# Patient Record
Sex: Female | Born: 1970 | Hispanic: No | Marital: Single | Smoking: Current every day smoker
Health system: Southern US, Community
[De-identification: ages and names within clinical notes are randomized; demographics above are authoritative.]

## PROBLEM LIST (undated history)

## (undated) DIAGNOSIS — N760 Acute vaginitis: Secondary | ICD-10-CM

## (undated) DIAGNOSIS — D509 Iron deficiency anemia, unspecified: Principal | ICD-10-CM

## (undated) DIAGNOSIS — T4145XA Adverse effect of unspecified anesthetic, initial encounter: Secondary | ICD-10-CM

## (undated) DIAGNOSIS — I10 Essential (primary) hypertension: Secondary | ICD-10-CM

## (undated) DIAGNOSIS — J309 Allergic rhinitis, unspecified: Secondary | ICD-10-CM

## (undated) DIAGNOSIS — B9689 Other specified bacterial agents as the cause of diseases classified elsewhere: Secondary | ICD-10-CM

## (undated) DIAGNOSIS — D219 Benign neoplasm of connective and other soft tissue, unspecified: Secondary | ICD-10-CM

## (undated) DIAGNOSIS — T7840XA Allergy, unspecified, initial encounter: Secondary | ICD-10-CM

## (undated) DIAGNOSIS — K219 Gastro-esophageal reflux disease without esophagitis: Secondary | ICD-10-CM

## (undated) DIAGNOSIS — I499 Cardiac arrhythmia, unspecified: Secondary | ICD-10-CM

## (undated) DIAGNOSIS — N390 Urinary tract infection, site not specified: Secondary | ICD-10-CM

## (undated) HISTORY — DX: Benign neoplasm of connective and other soft tissue, unspecified: D21.9

## (undated) HISTORY — DX: Iron deficiency anemia, unspecified: D50.9

## (undated) HISTORY — DX: Other specified bacterial agents as the cause of diseases classified elsewhere: B96.89

## (undated) HISTORY — DX: Allergic rhinitis, unspecified: J30.9

## (undated) HISTORY — DX: Essential (primary) hypertension: I10

## (undated) HISTORY — DX: Other specified bacterial agents as the cause of diseases classified elsewhere: N76.0

## (undated) HISTORY — DX: Allergy, unspecified, initial encounter: T78.40XA

## (undated) HISTORY — PX: ABDOMINAL HYSTERECTOMY: SHX81

## (undated) HISTORY — DX: Urinary tract infection, site not specified: N39.0

## (undated) HISTORY — PX: DILATION AND CURETTAGE OF UTERUS: SHX78

---

## 2001-06-24 ENCOUNTER — Emergency Department (HOSPITAL_COMMUNITY): Admission: EM | Admit: 2001-06-24 | Discharge: 2001-06-24 | Payer: Self-pay

## 2006-09-17 DIAGNOSIS — T8859XA Other complications of anesthesia, initial encounter: Secondary | ICD-10-CM

## 2006-09-17 HISTORY — DX: Other complications of anesthesia, initial encounter: T88.59XA

## 2008-01-14 ENCOUNTER — Emergency Department: Payer: Self-pay | Admitting: Emergency Medicine

## 2008-04-06 ENCOUNTER — Ambulatory Visit: Payer: Self-pay | Admitting: Obstetrics and Gynecology

## 2009-12-15 ENCOUNTER — Emergency Department: Payer: Self-pay | Admitting: Emergency Medicine

## 2010-11-22 ENCOUNTER — Emergency Department: Payer: Self-pay | Admitting: Emergency Medicine

## 2012-05-07 ENCOUNTER — Emergency Department (HOSPITAL_COMMUNITY)
Admission: EM | Admit: 2012-05-07 | Discharge: 2012-05-07 | Disposition: A | Discharge status: Home / Self Care | Payer: Self-pay | Attending: Emergency Medicine | Admitting: Emergency Medicine

## 2012-05-07 ENCOUNTER — Encounter (HOSPITAL_COMMUNITY): Payer: Self-pay | Admitting: *Deleted

## 2012-05-07 DIAGNOSIS — F17200 Nicotine dependence, unspecified, uncomplicated: Secondary | ICD-10-CM | POA: Insufficient documentation

## 2012-05-07 DIAGNOSIS — K5289 Other specified noninfective gastroenteritis and colitis: Secondary | ICD-10-CM | POA: Insufficient documentation

## 2012-05-07 DIAGNOSIS — K529 Noninfective gastroenteritis and colitis, unspecified: Secondary | ICD-10-CM

## 2012-05-07 LAB — CBC WITH DIFFERENTIAL/PLATELET
Basophils Relative: 0 % (ref 0–1)
Eosinophils Absolute: 0.1 10*3/uL (ref 0.0–0.7)
Eosinophils Relative: 1 % (ref 0–5)
Hemoglobin: 9.9 g/dL — ABNORMAL LOW (ref 12.0–15.0)
Lymphs Abs: 3.9 10*3/uL (ref 0.7–4.0)
MCH: 22.3 pg — ABNORMAL LOW (ref 26.0–34.0)
MCHC: 30.8 g/dL (ref 30.0–36.0)
MCV: 72.3 fL — ABNORMAL LOW (ref 78.0–100.0)
Monocytes Absolute: 0.4 10*3/uL (ref 0.1–1.0)
Neutro Abs: 6.2 10*3/uL (ref 1.7–7.7)
Neutrophils Relative %: 58 % (ref 43–77)
RBC: 4.44 MIL/uL (ref 3.87–5.11)

## 2012-05-07 LAB — BASIC METABOLIC PANEL
BUN: 9 mg/dL (ref 6–23)
CO2: 23 mEq/L (ref 19–32)
GFR calc non Af Amer: >90 mL/min (ref >90)
Glucose, Bld: 94 mg/dL (ref 70–99)
Potassium: 3.7 mEq/L (ref 3.5–5.1)
Sodium: 139 mEq/L (ref 135–145)

## 2012-05-07 LAB — URINALYSIS, ROUTINE W REFLEX MICROSCOPIC
Bilirubin Urine: NEGATIVE
Glucose, UA: NEGATIVE mg/dL
Ketones, ur: NEGATIVE mg/dL
Protein, ur: NEGATIVE mg/dL
pH: 6 (ref 5.0–8.0)

## 2012-05-07 LAB — URINE MICROSCOPIC-ADD ON

## 2012-05-07 MED ORDER — SODIUM CHLORIDE 0.9 % IV BOLUS (SEPSIS)
1000.0000 mL | Freq: Once | INTRAVENOUS | Status: AC
  Administered 2012-05-07: 1000 mL via INTRAVENOUS

## 2012-05-07 MED ORDER — LOPERAMIDE HCL 2 MG PO CAPS: 2.0000 mg | ORAL_CAPSULE | Freq: Four times a day (QID) | ORAL | Status: AC | PRN

## 2012-05-07 MED ORDER — PROMETHAZINE HCL 25 MG PO TABS: 25.0000 mg | ORAL_TABLET | Freq: Four times a day (QID) | ORAL | Status: DC | PRN

## 2012-05-07 NOTE — ED Provider Notes (Signed)
History     CSN: [id number]  Arrival date & time 05/07/12  1310   First MD Initiated Contact with Patient 05/07/12 1958      Chief Complaint  Patient presents with  . Abdominal Pain  . Emesis  . Vaginal Discharge    (Consider location/radiation/quality/duration/timing/severity/associated sxs/prior treatment) Patient is a 41 y.o. female presenting with abdominal pain. The history is provided by the patient.  Abdominal Pain The primary symptoms of the illness include abdominal pain, nausea, vomiting and diarrhea. The primary symptoms of the illness do not include fever, shortness of breath, dysuria or vaginal discharge.  Symptoms associated with the illness do not include chills, diaphoresis, hematuria or back pain.   she complains of lower abdominal pain, with nausea, vomiting, diarrhea, for the past week.  She says the pain is crampy in nature, and lasts a few minutes at a time.  She has not had blood in her emesis or stool.  She denies urinary tract symptoms.  She specifically denies any vaginal discharge.  She has not had fevers, or chills.  She states that her boyfriend has had similar symptoms.  Presently, she has no abdominal pain, and no nausea.  History reviewed. No pertinent past medical history.  History reviewed. No pertinent past surgical history.  No family history on file.  History  Substance Use Topics  . Smoking status: Current Everyday Smoker  . Smokeless tobacco: Not on file  . Alcohol Use: Yes     rare    OB History    Grav Para Term Preterm Abortions TAB SAB Ect Mult Living                  Review of Systems  Constitutional: Negative for fever, chills and diaphoresis.  Respiratory: Negative for cough and shortness of breath.   Cardiovascular: Negative for chest pain.  Gastrointestinal: Positive for nausea, vomiting, abdominal pain and diarrhea.  Genitourinary: Negative for dysuria, hematuria and vaginal discharge.  Musculoskeletal: Negative for back  pain.  Neurological: Negative for headaches.  Psychiatric/Behavioral: Negative for confusion.  All other systems reviewed and are negative.    Allergies  Review of patient's allergies indicates no known allergies.  Home Medications  No current outpatient prescriptions on file.  BP 136/80  Pulse 80  Temp 98.6 F (37 C) (Oral)  Resp 18  SpO2 100%  LMP 03/30/2012  Physical Exam  Nursing note and vitals reviewed. Constitutional: She is oriented to person, place, and time. She appears well-developed and well-nourished. No distress.  HENT:  Head: Normocephalic and atraumatic.  Eyes: Conjunctivae are normal.  Neck: Normal range of motion. Neck supple.  Cardiovascular: Normal rate, regular rhythm and intact distal pulses.   No murmur heard. Pulmonary/Chest: Effort normal and breath sounds normal.  Abdominal: Soft. Bowel sounds are normal. She exhibits no distension. There is no tenderness. There is no rebound and no guarding.  Musculoskeletal: Normal range of motion.  Neurological: She is alert and oriented to person, place, and time.  Skin: Skin is warm.  Psychiatric: She has a normal mood and affect. Thought content normal.    ED Course  Procedures (including critical care time) 41 year old, female, with symptoms consistent with gastroenteritis for approximately one week.  Currently, asymptomatic.  No acute abdomen.  We'll perform laboratory testing, and treat with IV fluids, since her symptoms have been for such a prolonged period.  Labs Reviewed  CBC WITH DIFFERENTIAL - Abnormal; Notable for the following:    WBC 10.6 (*)  Hemoglobin 9.9 (*)     HCT 32.1 (*)     MCV 72.3 (*)     MCH 22.3 (*)     RDW 19.1 (*)     All other components within normal limits  URINALYSIS, ROUTINE W REFLEX MICROSCOPIC - Abnormal; Notable for the following:    APPearance CLOUDY (*)     Hgb urine dipstick SMALL (*)     Leukocytes, UA MODERATE (*)     All other components within normal  limits  URINE MICROSCOPIC-ADD ON - Abnormal; Notable for the following:    Squamous Epithelial / LPF FEW (*)     All other components within normal limits  BASIC METABOLIC PANEL  POCT PREGNANCY, URINE   No results found.   No diagnosis found.    MDM  Gastroenteritis.  No signs of toxicity.  No acute abdomen.  No evidence of dehydration.        Cheri Guppy, MD 05/07/12 2138

## 2012-05-07 NOTE — ED Notes (Signed)
PT is here with abdominal pain, nausea, and vomiting with diarrhea.  Pt reports vaginal discharge that is white with odor and pain.  LMP-7/14

## 2012-05-07 NOTE — ED Notes (Signed)
Pt c/o lower abdominal pain, pt c/o nausea and states she vomited twice this morning. Pt rates pain at a 5, pt states she has rectal pain, pt denies blooding stool and states that she has had the rectal pain for about a week.

## 2013-07-10 ENCOUNTER — Other Ambulatory Visit: Payer: Self-pay | Admitting: *Deleted

## 2014-03-29 ENCOUNTER — Ambulatory Visit: Payer: Self-pay | Admitting: Internal Medicine

## 2014-04-17 ENCOUNTER — Ambulatory Visit: Payer: Self-pay | Admitting: Internal Medicine

## 2014-06-21 ENCOUNTER — Ambulatory Visit: Payer: Self-pay | Admitting: Internal Medicine

## 2014-06-21 LAB — CBC CANCER CENTER
BASOS ABS: 0.1 x10 3/mm (ref 0.0–0.1)
Basophil %: 0.6 %
EOS PCT: 1.6 %
Eosinophil #: 0.2 x10 3/mm (ref 0.0–0.7)
HCT: 36.3 % (ref 35.0–47.0)
HGB: 11.7 g/dL — ABNORMAL LOW (ref 12.0–16.0)
LYMPHS ABS: 4 x10 3/mm — AB (ref 1.0–3.6)
LYMPHS PCT: 38.6 %
MCH: 27.1 pg (ref 26.0–34.0)
MCHC: 32.1 g/dL (ref 32.0–36.0)
MCV: 84 fL (ref 80–100)
MONO ABS: 0.5 x10 3/mm (ref 0.2–0.9)
MONOS PCT: 5.3 %
Neutrophil #: 5.6 x10 3/mm (ref 1.4–6.5)
Neutrophil %: 53.9 %
Platelet: 434 x10 3/mm (ref 150–440)
RBC: 4.31 10*6/uL (ref 3.80–5.20)
RDW: 22.3 % — AB (ref 11.5–14.5)
WBC: 10.3 x10 3/mm (ref 3.6–11.0)

## 2014-06-21 LAB — FERRITIN: Ferritin (ARMC): 7 ng/mL — ABNORMAL LOW (ref 8–388)

## 2014-06-21 LAB — IRON AND TIBC
IRON BIND. CAP.(TOTAL): 423 ug/dL (ref 250–450)
IRON SATURATION: 5 %
IRON: 23 ug/dL — AB (ref 50–170)
Unbound Iron-Bind.Cap.: 400 ug/dL

## 2014-06-21 LAB — RETICULOCYTES
Absolute Retic Count: 0.0558 10*6/uL (ref 0.019–0.186)
RETICULOCYTE: 1.29 % (ref 0.4–3.1)

## 2014-06-21 LAB — FOLATE: Folic Acid: 7.6 ng/mL (ref 3.1–100.0)

## 2014-07-18 ENCOUNTER — Ambulatory Visit: Payer: Self-pay | Admitting: Internal Medicine

## 2014-09-13 ENCOUNTER — Ambulatory Visit: Payer: Self-pay | Admitting: Internal Medicine

## 2014-09-13 LAB — CBC CANCER CENTER
BASOS ABS: 0.1 x10 3/mm (ref 0.0–0.1)
Basophil %: 0.5 %
Eosinophil #: 0.2 x10 3/mm (ref 0.0–0.7)
Eosinophil %: 2 %
HCT: 40.2 % (ref 35.0–47.0)
HGB: 13.2 g/dL (ref 12.0–16.0)
LYMPHS ABS: 3.6 x10 3/mm (ref 1.0–3.6)
Lymphocyte %: 35.5 %
MCH: 30.8 pg (ref 26.0–34.0)
MCHC: 32.9 g/dL (ref 32.0–36.0)
MCV: 94 fL (ref 80–100)
MONOS PCT: 5.5 %
Monocyte #: 0.6 x10 3/mm (ref 0.2–0.9)
NEUTROS PCT: 56.5 %
Neutrophil #: 5.8 x10 3/mm (ref 1.4–6.5)
PLATELETS: 297 x10 3/mm (ref 150–440)
RBC: 4.29 10*6/uL (ref 3.80–5.20)
RDW: 15.4 % — ABNORMAL HIGH (ref 11.5–14.5)
WBC: 10.2 x10 3/mm (ref 3.6–11.0)

## 2014-09-13 LAB — FERRITIN: Ferritin (ARMC): 24 ng/mL (ref 8–388)

## 2014-09-13 LAB — IRON AND TIBC
IRON BIND. CAP.(TOTAL): 339 ug/dL (ref 250–450)
IRON SATURATION: 28 %
Iron: 95 ug/dL (ref 50–170)
UNBOUND IRON-BIND. CAP.: 244 ug/dL

## 2014-09-17 ENCOUNTER — Ambulatory Visit: Payer: Self-pay | Admitting: Internal Medicine

## 2015-01-03 ENCOUNTER — Ambulatory Visit
Admit: 2015-01-03 | Disposition: A | Discharge status: Home / Self Care | Payer: Self-pay | Attending: Internal Medicine | Admitting: Internal Medicine

## 2015-01-03 LAB — CBC CANCER CENTER
BASOS ABS: 0.1 x10 3/mm (ref 0.0–0.1)
Basophil %: 0.8 %
Eosinophil #: 0.2 x10 3/mm (ref 0.0–0.7)
Eosinophil %: 1.5 %
HCT: 32.8 % — ABNORMAL LOW (ref 35.0–47.0)
HGB: 10.5 g/dL — ABNORMAL LOW (ref 12.0–16.0)
LYMPHS ABS: 4.3 x10 3/mm — AB (ref 1.0–3.6)
Lymphocyte %: 43 %
MCH: 27.3 pg (ref 26.0–34.0)
MCHC: 31.9 g/dL — ABNORMAL LOW (ref 32.0–36.0)
MCV: 86 fL (ref 80–100)
MONO ABS: 0.7 x10 3/mm (ref 0.2–0.9)
Monocyte %: 6.9 %
NEUTROS ABS: 4.8 x10 3/mm (ref 1.4–6.5)
Neutrophil %: 47.8 %
PLATELETS: 393 x10 3/mm (ref 150–440)
RBC: 3.84 10*6/uL (ref 3.80–5.20)
RDW: 16.8 % — AB (ref 11.5–14.5)
WBC: 10 x10 3/mm (ref 3.6–11.0)

## 2015-01-03 LAB — IRON AND TIBC
Iron Bind.Cap.(Total): 475 — ABNORMAL HIGH (ref 250–450)
Iron Saturation: 3.8
Iron: 18 ug/dL — ABNORMAL LOW
Unbound Iron-Bind.Cap.: 457.2

## 2015-01-03 LAB — FERRITIN: Ferritin (ARMC): 4 ng/mL — ABNORMAL LOW

## 2015-01-12 LAB — PREGNANCY, URINE: PREGNANCY TEST, URINE: NEGATIVE m[IU]/mL

## 2015-01-25 ENCOUNTER — Encounter: Payer: Self-pay | Admitting: *Deleted

## 2015-01-25 ENCOUNTER — Other Ambulatory Visit: Payer: Self-pay | Admitting: *Deleted

## 2015-01-25 DIAGNOSIS — Z862 Personal history of diseases of the blood and blood-forming organs and certain disorders involving the immune mechanism: Secondary | ICD-10-CM | POA: Insufficient documentation

## 2015-01-25 DIAGNOSIS — D509 Iron deficiency anemia, unspecified: Secondary | ICD-10-CM

## 2015-01-25 HISTORY — DX: Iron deficiency anemia, unspecified: D50.9

## 2015-01-26 ENCOUNTER — Inpatient Hospital Stay: Payer: Medicaid Other | Attending: Internal Medicine

## 2015-01-26 ENCOUNTER — Encounter (INDEPENDENT_AMBULATORY_CARE_PROVIDER_SITE_OTHER): Payer: Self-pay

## 2015-01-26 VITALS — BP 124/76 | HR 73 | Temp 97.2°F | Resp 18

## 2015-01-26 DIAGNOSIS — Z79899 Other long term (current) drug therapy: Secondary | ICD-10-CM | POA: Insufficient documentation

## 2015-01-26 DIAGNOSIS — D509 Iron deficiency anemia, unspecified: Secondary | ICD-10-CM | POA: Insufficient documentation

## 2015-01-26 LAB — PREGNANCY, URINE: Preg Test, Ur: NEGATIVE

## 2015-01-26 MED ORDER — SODIUM CHLORIDE 0.9 % IV SOLN
Freq: Once | INTRAVENOUS | Status: AC
  Administered 2015-01-26: 11:00:00 via INTRAVENOUS
  Filled 2015-01-26: qty 1000

## 2015-01-26 MED ORDER — SODIUM CHLORIDE 0.9 % IV SOLN
400.0000 mg | Freq: Once | INTRAVENOUS | Status: AC
  Administered 2015-01-26: 400 mg via INTRAVENOUS
  Filled 2015-01-26: qty 20

## 2015-04-24 ENCOUNTER — Other Ambulatory Visit: Payer: Self-pay | Admitting: *Deleted

## 2015-04-24 DIAGNOSIS — D509 Iron deficiency anemia, unspecified: Secondary | ICD-10-CM

## 2015-04-25 ENCOUNTER — Inpatient Hospital Stay: Payer: Medicaid Other

## 2015-05-10 ENCOUNTER — Ambulatory Visit: Payer: Self-pay | Admitting: Obstetrics and Gynecology

## 2015-05-12 ENCOUNTER — Encounter: Payer: Medicaid Other | Admitting: Family Medicine

## 2015-06-07 ENCOUNTER — Encounter: Payer: Medicaid Other | Admitting: Family Medicine

## 2015-07-15 ENCOUNTER — Encounter: Payer: Medicaid Other | Admitting: Family Medicine

## 2015-07-28 ENCOUNTER — Encounter: Payer: Medicaid Other | Admitting: Family Medicine

## 2015-08-15 ENCOUNTER — Ambulatory Visit: Payer: Self-pay | Admitting: Internal Medicine

## 2015-08-15 ENCOUNTER — Encounter: Payer: Self-pay | Admitting: Family Medicine

## 2015-08-15 ENCOUNTER — Ambulatory Visit (INDEPENDENT_AMBULATORY_CARE_PROVIDER_SITE_OTHER): Payer: Medicaid Other | Admitting: Family Medicine

## 2015-08-15 ENCOUNTER — Inpatient Hospital Stay: Payer: Medicaid Other

## 2015-08-15 ENCOUNTER — Other Ambulatory Visit: Payer: Self-pay | Admitting: *Deleted

## 2015-08-15 ENCOUNTER — Inpatient Hospital Stay: Payer: Medicaid Other | Attending: Internal Medicine

## 2015-08-15 VITALS — BP 128/78 | HR 79 | Temp 98.6°F | Resp 18 | Ht 63.0 in | Wt 187.4 lb

## 2015-08-15 DIAGNOSIS — D509 Iron deficiency anemia, unspecified: Secondary | ICD-10-CM

## 2015-08-15 DIAGNOSIS — D500 Iron deficiency anemia secondary to blood loss (chronic): Secondary | ICD-10-CM | POA: Diagnosis not present

## 2015-08-15 DIAGNOSIS — Z0000 Encounter for general adult medical examination without abnormal findings: Secondary | ICD-10-CM

## 2015-08-15 DIAGNOSIS — T7840XA Allergy, unspecified, initial encounter: Secondary | ICD-10-CM | POA: Diagnosis not present

## 2015-08-15 DIAGNOSIS — N3000 Acute cystitis without hematuria: Secondary | ICD-10-CM

## 2015-08-15 DIAGNOSIS — D259 Leiomyoma of uterus, unspecified: Secondary | ICD-10-CM

## 2015-08-15 LAB — POCT URINALYSIS DIPSTICK
BILIRUBIN UA: NEGATIVE
GLUCOSE UA: NEGATIVE
Protein, UA: NEGATIVE
SPEC GRAV UA: 1.02
Urobilinogen, UA: NEGATIVE
pH, UA: 6

## 2015-08-15 MED ORDER — LORATADINE 10 MG PO TABS: 10.0000 mg | ORAL_TABLET | Freq: Every day | ORAL | Status: DC

## 2015-08-15 MED ORDER — CIPROFLOXACIN HCL 500 MG PO TABS: 500.0000 mg | ORAL_TABLET | Freq: Two times a day (BID) | ORAL | Status: DC

## 2015-08-15 NOTE — Progress Notes (Signed)
Name: Jill Davidson   MRN: [id number]    DOB: May 02, 1971   Date:08/15/2015       Progress Note  Subjective  Chief Complaint  Chief Complaint  Patient presents with  . Annual Exam    possible UTI    HPI  Compress to the H&P for this  44 year old female  UTI  Patient presents with a several day history of dysuria frequency and urgency.  There has been suprapubic and lower back discomfort.  No fever or chills noted.  There is been no hematuria. There has not been a past history of recurrent UTIs.   Past Medical History  Diagnosis Date  . IDA (iron deficiency anemia) 01/25/2015    Social History  Substance Use Topics  . Smoking status: Current Every Day Smoker  . Smokeless tobacco: Not on file  . Alcohol Use: Yes     Comment: rare    No current outpatient prescriptions on file.  No Known Allergies  Review of Systems  Constitutional: Negative for fever, chills and weight loss.  HENT: Negative for congestion, hearing loss, sore throat and tinnitus.   Eyes: Negative for blurred vision, double vision and redness.  Respiratory: Negative for cough, hemoptysis and shortness of breath.   Cardiovascular: Negative for chest pain, palpitations, orthopnea, claudication and leg swelling.  Gastrointestinal: Negative for heartburn, nausea, vomiting, diarrhea, constipation and blood in stool.  Genitourinary: Positive for dysuria, urgency and frequency. Negative for hematuria.  Musculoskeletal: Negative for myalgias, back pain, joint pain, falls and neck pain.  Skin: Negative for itching.  Neurological: Negative for dizziness, tingling, tremors, focal weakness, seizures, loss of consciousness, weakness and headaches.  Endo/Heme/Allergies: Does not bruise/bleed easily.  Psychiatric/Behavioral: Negative for depression and substance abuse. The patient is not nervous/anxious and does not have insomnia.      Objective  Filed Vitals:   08/15/15 0935  BP: 128/78  Pulse: 79  Temp: 98.6  F (37 C)  Resp: 18  Height: 5\' 3"  (1.6 m)  Weight: 187 lb 7 oz (85.021 kg)  SpO2: 99%     Physical Exam  Constitutional: She is oriented to person, place, and time.  Modestly obese in no acute distress  HENT:  Head: Normocephalic.  Eyes: EOM are normal. Pupils are equal, round, and reactive to light.  Neck: Normal range of motion. No thyromegaly present.  Cardiovascular: Normal rate, regular rhythm and normal heart sounds.   No murmur heard. Pulmonary/Chest: Effort normal and breath sounds normal.  Abdominal: Soft. Bowel sounds are normal.  Musculoskeletal: Normal range of motion. She exhibits no edema.  Neurological: She is alert and oriented to person, place, and time. No cranial nerve deficit. Gait normal.  Skin: Skin is warm and dry. No rash noted.  Psychiatric: Memory and affect normal.      Assessment & Plan  1. Annual physical exam  - CBC - Comprehensive Metabolic Panel (CMET) - Lipid Profile - TSH  2. Acute cystitis without hematuria  - POCT Urinalysis Dipstick - Urine Culture - ciprofloxacin (CIPRO) 500 MG tablet; Take 1 tablet (500 mg total) by mouth 2 (two) times daily.  Dispense: 14 tablet; Refill: 0  3. Allergy, initial encounter  - loratadine (CLARITIN) 10 MG tablet; Take 1 tablet (10 mg total) by mouth daily.  Dispense: 90 tablet; Refill: 3  4. Iron deficiency anemia due to chronic blood loss Workup for iron deficiency anemia - Ferritin - Iron Binding Cap (TIBC) - Iron  5. Uterine leiomyoma, unspecified location Then the  consideration for elective hysterotomy for hysterectomy and view of the degree of iron deficiency anemia she is experienced therefore will refer - Ambulatory referral to Gynecology

## 2015-08-15 NOTE — Patient Instructions (Signed)
Obesity Obesity is defined as having too much total body fat and a body mass index (BMI) of 30 or more. BMI is an estimate of body fat and is calculated from your height and weight. BMI is typically calculated by your health care provider during regular wellness visits. Obesity happens when you consume more calories than you can burn by exercising or performing daily physical tasks. Prolonged obesity can cause major illnesses or emergencies, such as:  Stroke.  Heart disease.  Diabetes.  Cancer.  Arthritis.  High blood pressure (hypertension).  High cholesterol.  Sleep apnea.  Erectile dysfunction.  Infertility problems. CAUSES   Regularly eating unhealthy foods.  Physical inactivity.  Certain disorders, such as an underactive thyroid (hypothyroidism), Cushing's syndrome, and polycystic ovarian syndrome.  Certain medicines, such as steroids, some depression medicines, and antipsychotics.  Genetics.  Lack of sleep. DIAGNOSIS A health care provider can diagnose obesity after calculating your BMI. Obesity will be diagnosed if your BMI is 30 or higher. There are other methods of measuring obesity levels. Some other methods include measuring your skinfold thickness, your waist circumference, and comparing your hip circumference to your waist circumference. TREATMENT  A healthy treatment program includes some or all of the following:  Long-term dietary changes.  Exercise and physical activity.  Behavioral and lifestyle changes.  Medicine only under the supervision of your health care provider. Medicines may help, but only if they are used with diet and exercise programs. If your BMI is 40 or higher, your health care provider may recommend specialized surgery or programs to help with weight loss. An unhealthy treatment program includes:  Fasting.  Fad diets.  Supplements and drugs. These choices do not succeed in long-term weight control. HOME CARE  INSTRUCTIONS  Exercise and perform physical activity as directed by your health care provider. To increase physical activity, try the following:  Use stairs instead of elevators.  Park farther away from store entrances.  Garden, bike, or walk instead of watching television or using the computer.  Eat healthy, low-calorie foods and drinks on a regular basis. Eat more fruits and vegetables. Use low-calorie cookbooks or take healthy cooking classes.  Limit fast food, sweets, and processed snack foods.  Eat smaller portions.  Keep a daily journal of everything you eat. There are many free websites to help you with this. It may be helpful to measure your foods so you can determine if you are eating the correct portion sizes.  Avoid drinking alcohol. Drink more water and drinks without calories.  Take vitamins and supplements only as recommended by your health care provider.  Weight-loss support groups, Tax adviser, counselors, and stress reduction education can also be very helpful. SEEK IMMEDIATE MEDICAL CARE IF:  You have chest pain or tightness.  You have trouble breathing or feel short of breath.  You have weakness or leg numbness.  You feel confused or have trouble talking.  You have sudden changes in your vision.   This information is not intended to replace advice given to you by your health care provider. Make sure you discuss any questions you have with your health care provider.   Document Released: 10/11/2004 Document Revised: 09/24/2014 Document Reviewed: 10/10/2011 Elsevier Interactive Patient Education 2016 Else[employer] Tobacco Use Disorder Tobacco use disorder (TUD) is a mental disorder. It is the long-term use of tobacco in spite of related health problems or difficulty with normal life activities. Tobacco is most commonly smoked as cigarettes and less commonly as cigars or pipes. Smokeless  chewing tobacco and snuff are also popular. People with TUD get  a feeling of extreme pleasure (euphoria) from using tobacco and have a desire to use it again and again. Repeated use of tobacco can cause problems. The addictive effects of tobacco are due mainly tothe ingredient nicotine. Nicotine also causes a rush of adrenaline (epinephrine) in the body. This leads to increased blood pressure, heart rate, and breathing rate. These changes may cause problems for people with high blood pressure, weak hearts, or lung disease. High doses of nicotine in children and pets can lead to seizures and death.  Tobacco contains a number of other unsafe chemicals. These chemicals are especially harmful when inhaled as smoke and can damage almost every organ in the body. Smokers live shorter lives than nonsmokers and are at risk of dying from a number of diseases and cancers. Tobacco smoke can also cause health problems for nonsmokers (due to inhaling secondhand smoke). Smoking is also a fire hazard.  TUD usually starts in the late teenage years and is most common in young adults between the ages of 42 and 70 years. People who start smoking earlier in life are more likely to continue smoking as adults. TUD is somewhat more common in men than women. People with TUD are at higher risk for using alcohol and other drugs of abuse. RISK FACTORS Risk factors for TUD include:   Having family members with the disorder.  Being around people who use tobacco.  Having an existing mental health issue such as schizophrenia, depression, bipolar disorder, ADHD, or posttraumatic stress disorder (PTSD). SIGNS AND SYMPTOMS  People with tobacco use disorder have two or more of the following signs and symptoms within 12 months:   Use of more tobacco over a longer period than intended.   Not able to cut down or control tobacco use.   A lot of time spent obtaining or using tobacco.   Strong desire or urge to use tobacco (craving). Cravings may last for 6 months or longer after  quitting.  Use of tobacco even when use leads to major problems at work, school, or home.   Use of tobacco even when use leads to relationship problems.   Giving up or cutting down on important life activities because of tobacco use.   Repeatedly using tobacco in situations where it puts you or others in physical danger, like smoking in bed.   Use of tobacco even when it is known that a physical or mental problem is likely related to tobacco use.   Physical problems are numerous and may include chronic bronchitis, emphysema, lung and other cancers, gum disease, high blood pressure, heart disease, and stroke.   Mental problems caused by tobacco may include difficulty sleeping and anxiety.  Need to use greater amounts of tobacco to get the same effect. This means you have developed a tolerance.   Withdrawal symptoms as a result of stopping or rapidly cutting back use. These symptoms may last a month or more after quitting and include the following:   Depressed, anxious, or irritable mood.   Difficulty concentrating.   Increased appetite.  Restlessness or trouble sleeping.   Use of tobacco to avoid withdrawal symptoms. DIAGNOSIS  Tobacco use disorder is diagnosed by your health care provider. A diagnosis may be made by:  Your health care provider asking questions about your tobacco use and any problems it may be causing.  A physical exam.  Lab tests.  You may be referred to a mental health  professional or addiction specialist. The severity of tobacco use disorder depends on the number of signs and symptoms you have:   Mild--Two or three symptoms.  Moderate--Four or five symptoms.   Severe--Six or more symptoms.  TREATMENT  Many people with tobacco use disorder are unable to quit on their own and need help. Treatment options include the following:  Nicotine replacement therapy (NRT). NRT provides nicotine without the other harmful chemicals in tobacco. NRT  gradually lowers the dosage of nicotine in the body and reduces withdrawal symptoms. NRT is available in over-the-counter forms (gum, lozenges, and skin patches) as well as prescription forms (mouth inhaler and nasal spray).  Medicines.This may include:  Antidepressant medicine that may reduce nicotine cravings.  A medicine that acts on nicotine receptors in the brain to reduce cravings and withdrawal symptoms. It may also block the effects of tobacco in people with TUD who relapse.  Counseling or talk therapy. A form of talk therapy called behavioral therapy is commonly used to treat people with TUD. Behavioral therapy looks at triggers for tobacco use, how to avoid them, and how to cope with cravings. It is most effective in person or by phone but is also available in self-help forms (books and Internet websites).  Support groups. These provide emotional support, advice, and guidance for quitting tobacco. The most effective treatment for TUD is usually a combination of medicine, talk therapy, and support groups. HOME CARE INSTRUCTIONS  Keep all follow-up visits as directed by your health care provider. This is important.  Take medicines only as directed by your health care provider.  Check with your health care provider before starting new prescription or over-the-counter medicines. SEEK MEDICAL CARE IF:  You are not able to take your medicines as prescribed.  Treatment is not helping your TUD and your symptoms get worse. SEEK IMMEDIATE MEDICAL CARE IF:  You have serious thoughts about hurting yourself or others.  You have trouble breathing, chest pain, sudden weakness, or sudden numbness in part of your body.   This information is not intended to replace advice given to you by your health care provider. Make sure you discuss any questions you have with your health care provider.   Document Released: 05/09/2004 Document Revised: 09/24/2014 Document Reviewed: 10/30/2013 Elsevier  Interactive Patient Education [employer].

## 2015-08-16 ENCOUNTER — Encounter: Payer: Medicaid Other | Admitting: Family Medicine

## 2015-08-16 LAB — IRON AND TIBC
IRON SATURATION: 3 % — AB (ref 15–55)
Iron: 13 ug/dL — ABNORMAL LOW (ref 27–159)
TIBC: 410 ug/dL (ref 250–450)
UIBC: 397 ug/dL (ref 131–425)

## 2015-08-16 LAB — COMPREHENSIVE METABOLIC PANEL
ALT: 6 IU/L (ref 0–32)
AST: 12 IU/L (ref 0–40)
Albumin/Globulin Ratio: 1.7 (ref 1.1–2.5)
Albumin: 3.8 g/dL (ref 3.5–5.5)
Alkaline Phosphatase: 45 IU/L (ref 39–117)
BUN/Creatinine Ratio: 14 (ref 9–23)
BUN: 10 mg/dL (ref 6–24)
Bilirubin Total: <0.2 mg/dL (ref 0.0–1.2)
CO2: 23 mmol/L (ref 18–29)
Calcium: 9.1 mg/dL (ref 8.7–10.2)
Chloride: 103 mmol/L (ref 97–106)
Creatinine, Ser: 0.69 mg/dL (ref 0.57–1.00)
GFR calc Af Amer: 124 mL/min/{1.73_m2} (ref >59)
GFR calc non Af Amer: 108 mL/min/{1.73_m2} (ref >59)
Globulin, Total: 2.3 g/dL (ref 1.5–4.5)
Glucose: 83 mg/dL (ref 65–99)
Potassium: 4.7 mmol/L (ref 3.5–5.2)
Sodium: 139 mmol/L (ref 136–144)
Total Protein: 6.1 g/dL (ref 6.0–8.5)

## 2015-08-16 LAB — CBC
Hematocrit: 31.5 % — ABNORMAL LOW (ref 34.0–46.6)
Hemoglobin: 9.7 g/dL — ABNORMAL LOW (ref 11.1–15.9)
MCH: 25.1 pg — ABNORMAL LOW (ref 26.6–33.0)
MCHC: 30.8 g/dL — ABNORMAL LOW (ref 31.5–35.7)
MCV: 82 fL (ref 79–97)
Platelets: 454 10*3/uL — ABNORMAL HIGH (ref 150–379)
RBC: 3.86 x10E6/uL (ref 3.77–5.28)
RDW: 17.1 % — ABNORMAL HIGH (ref 12.3–15.4)
WBC: 9.2 10*3/uL (ref 3.4–10.8)

## 2015-08-16 LAB — FERRITIN: Ferritin: 8 ng/mL — ABNORMAL LOW (ref 15–150)

## 2015-08-16 LAB — TSH: TSH: 1.32 u[IU]/mL (ref 0.450–4.500)

## 2015-08-16 LAB — LIPID PANEL
Chol/HDL Ratio: 3.1 ratio units (ref 0.0–4.4)
Cholesterol, Total: 138 mg/dL (ref 100–199)
HDL: 45 mg/dL (ref >39)
LDL Calculated: 80 mg/dL (ref 0–99)
Triglycerides: 66 mg/dL (ref 0–149)
VLDL Cholesterol Cal: 13 mg/dL (ref 5–40)

## 2015-08-17 LAB — URINE CULTURE

## 2015-08-22 ENCOUNTER — Telehealth: Payer: Self-pay | Admitting: Family Medicine

## 2015-08-22 NOTE — Telephone Encounter (Signed)
Pt states she may have a yeast infection and wants to know what she can do for that or if something can be called in. [facility] on [address]. [place].

## 2015-08-23 NOTE — Telephone Encounter (Signed)
Patient notified of labs.   

## 2015-08-25 NOTE — Telephone Encounter (Signed)
Diflucan 150x3

## 2015-08-26 ENCOUNTER — Encounter (HOSPITAL_COMMUNITY): Payer: Self-pay | Admitting: Emergency Medicine

## 2015-08-26 ENCOUNTER — Emergency Department (HOSPITAL_COMMUNITY)
Admission: EM | Admit: 2015-08-26 | Discharge: 2015-08-26 | Disposition: A | Discharge status: Home / Self Care | Payer: Medicaid Other | Attending: Emergency Medicine | Admitting: Emergency Medicine

## 2015-08-26 DIAGNOSIS — E860 Dehydration: Secondary | ICD-10-CM

## 2015-08-26 DIAGNOSIS — Z79899 Other long term (current) drug therapy: Secondary | ICD-10-CM | POA: Insufficient documentation

## 2015-08-26 DIAGNOSIS — F17200 Nicotine dependence, unspecified, uncomplicated: Secondary | ICD-10-CM | POA: Insufficient documentation

## 2015-08-26 DIAGNOSIS — Z862 Personal history of diseases of the blood and blood-forming organs and certain disorders involving the immune mechanism: Secondary | ICD-10-CM | POA: Insufficient documentation

## 2015-08-26 DIAGNOSIS — R112 Nausea with vomiting, unspecified: Secondary | ICD-10-CM

## 2015-08-26 DIAGNOSIS — N390 Urinary tract infection, site not specified: Secondary | ICD-10-CM

## 2015-08-26 DIAGNOSIS — R51 Headache: Secondary | ICD-10-CM | POA: Insufficient documentation

## 2015-08-26 DIAGNOSIS — R519 Headache, unspecified: Secondary | ICD-10-CM

## 2015-08-26 DIAGNOSIS — N938 Other specified abnormal uterine and vaginal bleeding: Secondary | ICD-10-CM | POA: Insufficient documentation

## 2015-08-26 DIAGNOSIS — R197 Diarrhea, unspecified: Secondary | ICD-10-CM | POA: Insufficient documentation

## 2015-08-26 LAB — URINE MICROSCOPIC-ADD ON

## 2015-08-26 LAB — URINALYSIS, ROUTINE W REFLEX MICROSCOPIC
Bilirubin Urine: NEGATIVE
GLUCOSE, UA: NEGATIVE mg/dL
Ketones, ur: NEGATIVE mg/dL
NITRITE: NEGATIVE
PH: 6 (ref 5.0–8.0)
Protein, ur: NEGATIVE mg/dL
SPECIFIC GRAVITY, URINE: 1.024 (ref 1.005–1.030)

## 2015-08-26 LAB — COMPREHENSIVE METABOLIC PANEL
ALBUMIN: 4 g/dL (ref 3.5–5.0)
ALT: 10 U/L — ABNORMAL LOW (ref 14–54)
ANION GAP: 7 (ref 5–15)
AST: 17 U/L (ref 15–41)
Alkaline Phosphatase: 49 U/L (ref 38–126)
BILIRUBIN TOTAL: 0.5 mg/dL (ref 0.3–1.2)
BUN: 11 mg/dL (ref 6–20)
CHLORIDE: 107 mmol/L (ref 101–111)
CO2: 25 mmol/L (ref 22–32)
Calcium: 9 mg/dL (ref 8.9–10.3)
Creatinine, Ser: 0.72 mg/dL (ref 0.44–1.00)
GFR calc Af Amer: >60 mL/min (ref >60)
GFR calc non Af Amer: >60 mL/min (ref >60)
GLUCOSE: 95 mg/dL (ref 65–99)
POTASSIUM: 3.6 mmol/L (ref 3.5–5.1)
Sodium: 139 mmol/L (ref 135–145)
TOTAL PROTEIN: 7.2 g/dL (ref 6.5–8.1)

## 2015-08-26 LAB — CBC
HEMATOCRIT: 31 % — AB (ref 36.0–46.0)
HEMOGLOBIN: 9.7 g/dL — AB (ref 12.0–15.0)
MCH: 25 pg — ABNORMAL LOW (ref 26.0–34.0)
MCHC: 31.3 g/dL (ref 30.0–36.0)
MCV: 79.9 fL (ref 78.0–100.0)
Platelets: 385 10*3/uL (ref 150–400)
RBC: 3.88 MIL/uL (ref 3.87–5.11)
RDW: 17 % — ABNORMAL HIGH (ref 11.5–15.5)
WBC: 11.5 10*3/uL — AB (ref 4.0–10.5)

## 2015-08-26 LAB — LIPASE, BLOOD: LIPASE: 32 U/L (ref 11–51)

## 2015-08-26 MED ORDER — METRONIDAZOLE 500 MG PO TABS: 500.0000 mg | ORAL_TABLET | Freq: Two times a day (BID) | ORAL | Status: DC

## 2015-08-26 MED ORDER — DEXTROSE 5 % IV SOLN
1.0000 g | Freq: Once | INTRAVENOUS | Status: AC
  Administered 2015-08-26: 1 g via INTRAVENOUS
  Filled 2015-08-26: qty 10

## 2015-08-26 MED ORDER — CEPHALEXIN 500 MG PO CAPS: 500.0000 mg | ORAL_CAPSULE | Freq: Four times a day (QID) | ORAL | Status: DC

## 2015-08-26 MED ORDER — ONDANSETRON HCL 4 MG PO TABS: 4.0000 mg | ORAL_TABLET | Freq: Three times a day (TID) | ORAL | Status: DC | PRN

## 2015-08-26 MED ORDER — METOCLOPRAMIDE HCL 5 MG/ML IJ SOLN
10.0000 mg | Freq: Once | INTRAMUSCULAR | Status: AC
  Administered 2015-08-26: 10 mg via INTRAVENOUS
  Filled 2015-08-26: qty 2

## 2015-08-26 NOTE — ED Provider Notes (Signed)
CSN: [id number]     Arrival date & time 08/26/15  1222 History   First MD Initiated Contact with Patient 08/26/15 1446     Chief Complaint  Patient presents with  . Emesis  . Diarrhea  . Headache     (Consider location/radiation/quality/duration/timing/severity/associated sxs/prior Treatment) HPI  44 year old female history of anemia here with 2 days of initially worsening now improving vomiting and diarrhea without blood. Also with a slight headache but no other neurologic symptoms. Hasn't her son was ill with the exact same symptoms and he improved on Monday and Tuesday. She has had subjective fever but nothing measured. No chills. No rashes. Was recently treated for UTI but feels that she still has her symptoms.  Past Medical History  Diagnosis Date  . IDA (iron deficiency anemia) 01/25/2015   History reviewed. No pertinent past surgical history. History reviewed. No pertinent family history. Social History  Substance Use Topics  . Smoking status: Current Every Day Smoker  . Smokeless tobacco: None  . Alcohol Use: Yes     Comment: rare   OB History    No data available     Review of Systems  Constitutional: Negative for fever.  HENT: Negative for congestion and facial swelling.   Eyes: Negative for discharge and redness.  Respiratory: Negative for cough, shortness of breath and wheezing.   Cardiovascular: Negative for chest pain and palpitations.  Gastrointestinal: Negative for abdominal pain and abdominal distention.  Endocrine: Negative for polydipsia and polyuria.  Genitourinary: Positive for dysuria and vaginal bleeding.  Musculoskeletal: Negative for back pain.  Skin: Negative for wound.  Neurological: Negative for headaches.  All other systems reviewed and are negative.     Allergies  Review of patient's allergies indicates no known allergies.  Home Medications   Prior to Admission medications   Medication Sig Start Date End Date Taking? Authorizing  Provider  ibuprofen (ADVIL,MOTRIN) 200 MG tablet Take 200 mg by mouth every 6 (six) hours as needed for moderate pain.   Yes Historical Provider, MD  loratadine (CLARITIN) 10 MG tablet Take 1 tablet (10 mg total) by mouth daily. 08/15/15  Yes Ashok Norris, MD  cephALEXin (KEFLEX) 500 MG capsule Take 1 capsule (500 mg total) by mouth 4 (four) times daily. 08/26/15   Merrily Pew, MD  metroNIDAZOLE (FLAGYL) 500 MG tablet Take 1 tablet (500 mg total) by mouth 2 (two) times daily. One po bid x 7 days 08/26/15   Merrily Pew, MD   BP 125/86 mmHg  Pulse 81  Temp(Src) 97.9 F (36.6 C) (Oral)  Resp 16  SpO2 96% Physical Exam  Constitutional: She is oriented to person, place, and time. She appears well-developed and well-nourished.  HENT:  Head: Normocephalic and atraumatic.  Eyes: Pupils are equal, round, and reactive to light.  Neck: Normal range of motion. Neck supple.  Cardiovascular: Normal rate and regular rhythm.   Pulmonary/Chest: Effort normal and breath sounds normal. No stridor. No respiratory distress.  Abdominal: Soft. She exhibits no distension. There is no tenderness. There is no rebound.  Musculoskeletal: Normal range of motion. She exhibits no edema or tenderness.  Neurological: She is alert and oriented to person, place, and time.  Skin: Skin is warm and dry. No rash noted. No erythema.  Nursing note and vitals reviewed.   ED Course  Procedures (including critical care time) Labs Review Labs Reviewed  COMPREHENSIVE METABOLIC PANEL - Abnormal; Notable for the following:    ALT 10 (*)    All other  components within normal limits  CBC - Abnormal; Notable for the following:    WBC 11.5 (*)    Hemoglobin 9.7 (*)    HCT 31.0 (*)    MCH 25.0 (*)    RDW 17.0 (*)    All other components within normal limits  URINALYSIS, ROUTINE W REFLEX MICROSCOPIC (NOT AT [facility]) - Abnormal; Notable for the following:    APPearance CLOUDY (*)    Hgb urine dipstick LARGE (*)    Leukocytes,  UA MODERATE (*)    All other components within normal limits  URINE MICROSCOPIC-ADD ON - Abnormal; Notable for the following:    Squamous Epithelial / LPF 0-5 (*)    Bacteria, UA FEW (*)    All other components within normal limits  URINE CULTURE  LIPASE, BLOOD    Imaging Review No results found. I have personally reviewed and evaluated these images and lab results as part of my medical decision-making.   EKG Interpretation None      MDM   Final diagnoses:  UTI (lower urinary tract infection)  Non-intractable vomiting with nausea, vomiting of unspecified type  Nonintractable headache, unspecified chronicity pattern, unspecified headache type  Mild dehydration   Likely gastroenteritis. Labs ok, doubt intraabdominal pathology otherwise. Recently tx for UTI with levaquin, still e/o UTI, will give another 5 day course of keflex.   Symptoms improved. Headache improved. Neuro exam unchaged. Stable for dc. UTI. Trichomoniasis. Dehydration 2/2 viral gastroenteritis as likely cause.     Merrily Pew, MD 08/26/15 [phone]

## 2015-08-26 NOTE — ED Notes (Signed)
Pt states grandchildren have had the stomach virus recently. Since Wednesday pt having nausea, vomiting, and diarrhea. Unable to keep down food or fluids, this morning having a headache, has not used any OTC medication.

## 2015-08-26 NOTE — ED Notes (Signed)
Awake. Verbally responsive. A/O x4. Resp even and unlabored. No audible adventitious breath sounds noted. ABC's intact.  

## 2015-08-28 LAB — URINE CULTURE: Culture: >=100000

## 2015-08-30 ENCOUNTER — Telehealth (HOSPITAL_COMMUNITY): Payer: Self-pay

## 2015-08-30 NOTE — Telephone Encounter (Signed)
Post ED Visit - Positive Culture Follow-up  Culture report reviewed by antimicrobial stewardship pharmacist:  []  Elenor Quinones, Pharm.D. []  Heide Guile, Pharm.D., BCPS []  Parks Neptune, Pharm.D. [x]  Alycia Rossetti, Pharm.D., BCPS []  [place], Pharm.D., BCPS, AAHIVP []  Legrand Como, Pharm.D., BCPS, AAHIVP []  Milus Glazier, Pharm.D. []  Stephens November, Pharm.D.  Positive urine culture, >/= 100,000 colonies -> Group B Strep  Treated with Cephalexin, organism sensitive to the same and no further patient follow-up is required at this time.  Dortha Kern 08/30/2015, 2:48 AM

## 2015-09-01 ENCOUNTER — Encounter: Payer: Self-pay | Admitting: Obstetrics and Gynecology

## 2015-09-01 ENCOUNTER — Ambulatory Visit (INDEPENDENT_AMBULATORY_CARE_PROVIDER_SITE_OTHER): Payer: Self-pay | Admitting: Obstetrics and Gynecology

## 2015-09-01 VITALS — BP 169/85 | HR 76 | Ht 64.0 in | Wt 182.9 lb

## 2015-09-01 DIAGNOSIS — D259 Leiomyoma of uterus, unspecified: Secondary | ICD-10-CM

## 2015-09-01 DIAGNOSIS — N920 Excessive and frequent menstruation with regular cycle: Secondary | ICD-10-CM

## 2015-09-01 LAB — POCT URINE PREGNANCY: PREG TEST UR: NEGATIVE

## 2015-09-01 MED ORDER — MEDROXYPROGESTERONE ACETATE 10 MG PO TABS: 30.0000 mg | ORAL_TABLET | Freq: Every day | ORAL | Status: DC

## 2015-09-02 NOTE — Progress Notes (Signed)
GYN ENCOUNTER NOTE  Subjective:       Jill Davidson is a 44 y.o. [phone] female is here for gynecologic evaluation of the following issues:  1. Pelvic pain. 2.  Menorrhagia.  Patient presents for evaluation Recurrent onset menorrhagia with associated abdominal pelvic pain that is minimally responsive to ibuprofen and Tylenol.  She does have some radiation of her pain into her legs.  She has known fibroid uterus.  Ultrasound on 03/09/2014 demonstrated 3 small fibroids in the posterior fundal, anterior, fundal, and left lateral regions of the uterus.  Endometrial thickness at that time was 2.3 mm and no biopsy was performed.The patient was contemplating IUD insertion, but decided against that intervention.     Gynecologic History Patient's last menstrual period was 08/22/2015. Menarche age 80. Current interval is 21-23 days with heavy moderate bleeding lasting upwards of 8 days. She needs multiple pads daily to control clots. Patient does report worsening dysmenorrhea with radiation to back and legs over the past 4 years. History of anemia from menorrhagia which has required a transfusion in the past   Obstetric History OB History  Gravida Para Term Preterm AB SAB TAB Ectopic Multiple Living  6 4 4  2 2    4     # Outcome Date GA Lbr Len/2nd Weight Sex Delivery Anes PTL Lv  6 SAB 2010          5 SAB 1996          4 Term 1994   6 lb (2.722 kg) M VBAC   Y  3 Term 1992   7 lb (3.175 kg) M CS-LTranv   Y  2 Term 1991   6 lb (2.722 kg) M Vag-Spont   Y  1 Term 1989   7 lb 2.2 oz (3.239 kg) F Vag-Spont   Y      Past Medical History  Diagnosis Date  . IDA (iron deficiency anemia) 01/25/2015  . Fibroid   . UTI (lower urinary tract infection)   . Bacterial vaginosis     Past Surgical History  Procedure Laterality Date  . Cesarean section    . Dilation and curettage of uterus      Current Outpatient Prescriptions on File Prior to Visit  Medication Sig Dispense Refill  . cephALEXin  (KEFLEX) 500 MG capsule Take 1 capsule (500 mg total) by mouth 4 (four) times daily. 20 capsule 0  . metroNIDAZOLE (FLAGYL) 500 MG tablet Take 1 tablet (500 mg total) by mouth 2 (two) times daily. One po bid x 7 days 14 tablet 0   No current facility-administered medications on file prior to visit.    No Known Allergies  Social History   Social History  . Marital Status: Single    Spouse Name: N/A  . Number of Children: N/A  . Years of Education: N/A   Occupational History  . Not on file.   Social History Main Topics  . Smoking status: Current Every Day Smoker -- 0.50 packs/day    Types: Cigarettes  . Smokeless tobacco: Not on file  . Alcohol Use: Yes     Comment: once a week  . Drug Use: Yes    Special: Marijuana     Comment: once a week  . Sexual Activity: Yes    Birth Control/ Protection: None   Other Topics Concern  . Not on file   Social History Narrative    Family History  Problem Relation Age of Onset  . Diabetes Father   .  Colon cancer Maternal Aunt   . Breast cancer Maternal Aunt     great  . Heart disease Maternal Aunt   . Diabetes Maternal Grandmother   . Ovarian cancer Neg Hx     The following portions of the patient's history were reviewed and updated as appropriate: allergies, current medications, past family history, past medical history, past social history, past surgical history and problem list.  Review of Systems Review of Systems - General ROS: negative for - chills,  fever, hot flashes, malaise or night sweats.  POSITIVE-fatigue Hematological and Lymphatic ROS: negative for - bleeding problems or swollen lymph nodes Gastrointestinal ROS: negative for -, blood in stools, change in bowel habits and nausea/vomiting.  POSITIVE- abdominal pain Musculoskeletal ROS: negative for - joint pain, muscle pain or muscular weakness Genito-Urinary ROS: negative for - dysuria, genital discharge, genital ulcers, hematuria, incontinence.  POSITIVE-  change in  menstrual cycle, dysmenorrhea, dyspareunia, irregular/heavy menses, pelvic pain  Objective:   BP 169/85 mmHg  Pulse 76  Ht 5\' 4"  (1.626 m)  Wt 182 lb 14.4 oz (82.963 kg)  BMI 31.38 kg/m2  LMP 08/22/2015 CONSTITUTIONAL: Well-developed, well-nourished female in no acute distress.  HENT:  Normocephalic, atraumatic.  NECK: Normal range of motion, supple, no masses.  Normal thyroid.  SKIN: Skin is warm and dry. No rash noted. Not diaphoretic. No erythema. No pallor. [place]: Alert and oriented to person, place, and time. PSYCHIATRIC: Normal mood and affect. Normal behavior. Normal judgment and thought content. CARDIOVASCULAR:Not Examined RESPIRATORY: Not Examined BREASTS: Not Examined ABDOMEN: Soft, non distended; Non tender.  No Organomegaly. PELVIC:  External Genitalia: Normal  BUS: Normal  Vagina: Normal  Cervix: No cervical motion tenderness  Uterus: Anteverted, 12 weeks size, mobile, mildly tender  Adnexa: Normal  RV: Normal   Bladder: Nontender MUSCULOSKELETAL: Normal range of motion. No tenderness.  No cyanosis, clubbing, or edema.  Endometrial Biopsy Procedure Note  Pre-operative Diagnosis: Menorrhagia  Post-operative Diagnosis: same  Procedure Details   Urine pregnancy test was done  and result was Negative.  The risks (including infection, bleeding, pain, and uterine perforation) and benefits of the procedure were explained to the patient and Verbal informed consent was obtained.  Antibiotic prophylaxis against endocarditis was not indicated.   The patient was placed in the dorsal lithotomy position.  Bimanual exam showed the uterus to be in the anteroflexed position.  A Graves' speculum inserted in the vagina, and the cervix prepped with povidone iodine.  Endocervical curettage with a Kevorkian curette was not performed.   A sharp tenaculum was NOTapplied to the anterior lip of the cervix for stabilization.  A sterile uterine sound was used to sound the uterus to a  depth of 11cm.  A Mylex 45mm curette was used to sample the endometrium.  Sample was sent for pathologic examination.  Condition: Stable  Complications: None  Plan:  The patient was advised to call for any fever or for prolonged or severe pain or bleeding. She was advised to use OTC acetaminophen and OTC ibuprofen as needed for mild to moderate pain. She was advised to avoid vaginal intercourse for 48 hours or until the bleeding has completely stopped.  Attending Physician Documentation: Brayton Mars, MD      Assessment:   1. Menorrhagia with regular cycle - POCT urine pregnancy - Pathology - US Pelvis Complete; Future - US Transvaginal Non-OB; Future  2. Uterine leiomyoma, unspecified location 12 week size - US Pelvis Complete; Future - US Transvaginal Non-OB; Future  Plan:  1.  UPT-negative. 2.  Endometrial biopsy done 3.  Pelvic ultrasound. 4.  Follow up in early January for further management planning.   5.  Begin Provera 30 mg a day to suppress bleeding. 6.  Patient understands that if definitive surgery as needed, TVH/LAVH can be considered  Brayton Mars, MD  Note: This dictation was prepared with Dragon dictation along with smaller phrase technology. Any transcriptional errors that result from this process are unintentional.

## 2015-09-02 NOTE — Patient Instructions (Signed)
1.  Urine pregnancy test-negative. 2.  Pelvic ultrasound is ordered. 3.  Endometrial biopsy has been completed. 4.  Start Provera 30 mg day for suppression of bleeding. 5.  Return in early January for follow-up and further management planning. 6.  Continue with multitamin with iron. 7.  Tylenol and Advil as needed for pain is recommended

## 2015-09-06 ENCOUNTER — Telehealth: Payer: Self-pay

## 2015-09-06 LAB — PATHOLOGY

## 2015-09-06 MED ORDER — DOXYCYCLINE HYCLATE 100 MG PO TABS: 100.0000 mg | ORAL_TABLET | Freq: Two times a day (BID) | ORAL | Status: DC

## 2015-09-06 NOTE — Telephone Encounter (Signed)
-----   Message from Brayton Mars, MD sent at 09/06/2015  1:37 PM EST ----- Please notify - Abnormal Labs Endometrial biopsy shows chronic endometritis. Treatment with antibiotics should help to suppress abnormal uterine bleeding. Please call in doxycycline 100 mg twice a day for 14 days; #28; no refills

## 2015-09-06 NOTE — Telephone Encounter (Signed)
Pt aware med erx- see previous message.

## 2015-09-06 NOTE — Telephone Encounter (Signed)
Pt aware. Med erx. 

## 2015-09-07 ENCOUNTER — Telehealth: Payer: Self-pay | Admitting: Obstetrics and Gynecology

## 2015-09-07 NOTE — Telephone Encounter (Signed)
The med sent to [facility] IS $76.00 AND SHE SAID SHE CANT AFFORD THAT. CAN SOMETHING ELSE BE SENT INSTEAD PLEASE

## 2015-09-27 ENCOUNTER — Telehealth: Payer: Self-pay | Admitting: Obstetrics and Gynecology

## 2015-09-27 MED ORDER — MEDROXYPROGESTERONE ACETATE 10 MG PO TABS: 30.0000 mg | ORAL_TABLET | Freq: Every day | ORAL | Status: DC

## 2015-09-27 NOTE — Telephone Encounter (Signed)
PT IS ON AN ANTIBIOTIC AS IS BLEEDING MORE  AND NEEDS BC REFILLED. SHE WANTS TO ASK A QUESTION?

## 2015-09-27 NOTE — Telephone Encounter (Signed)
Spoke with pt- see previous message. 

## 2015-09-27 NOTE — Telephone Encounter (Signed)
Pt was able to purchase provera and doxycycline. D/c provera d/t she thinks there is a reaction with doxy and provera- bleeding has started again. Advised to contact pharmacy and ask. Refilled provera for 1 month. Advised pt to make an appt after she completes her ATB.

## 2015-09-30 ENCOUNTER — Telehealth: Payer: Self-pay | Admitting: Obstetrics and Gynecology

## 2015-09-30 NOTE — Telephone Encounter (Signed)
RX was supposed to be sent to pharmacy, its [facility] and she just finished the 90 sample and needs RX sent to [place]

## 2015-10-03 NOTE — Telephone Encounter (Signed)
Pt aware med erx with confirmation at pharmacy. Contacted pharmacy- they state they done have it.  Gave verbal. Pt aware.

## 2015-10-12 ENCOUNTER — Ambulatory Visit: Payer: Medicaid Other | Admitting: Obstetrics and Gynecology

## 2015-10-26 ENCOUNTER — Ambulatory Visit: Payer: Medicaid Other | Admitting: Obstetrics and Gynecology

## 2015-10-26 ENCOUNTER — Telehealth: Payer: Self-pay | Admitting: Obstetrics and Gynecology

## 2015-10-26 NOTE — Telephone Encounter (Signed)
Pt called and she had a question about the pills that dr de gave her, they are suppose to stop her bleeding and they have not and she wanted to know if she needs to stop or continue.

## 2015-10-31 NOTE — Telephone Encounter (Signed)
Pt has been on provera for heavy bleeding since 08/2015. She did take a 2 week break while on atb in 09/2015. LMP- 10/18/2015. She had a cycle and now she is having spotting only. Advised if she stops provera heavy bleeding may return. She needs to see mad for f/u. She is  waiting on her insurance card. Pt states if not in this week she will be self pay. She is aware of 50.00 for self pay.

## 2015-11-14 ENCOUNTER — Telehealth: Payer: Self-pay | Admitting: Obstetrics and Gynecology

## 2015-11-14 NOTE — Telephone Encounter (Signed)
Jill Davidson wants you to call her. She wouldn't say what for.

## 2015-11-14 NOTE — Telephone Encounter (Signed)
Pt needed her appt moved. R/s from 3/8 to 2/28 at 9:30. TB aware to put her on schedule.

## 2015-11-15 ENCOUNTER — Ambulatory Visit (INDEPENDENT_AMBULATORY_CARE_PROVIDER_SITE_OTHER): Payer: BLUE CROSS/BLUE SHIELD | Admitting: Obstetrics and Gynecology

## 2015-11-15 ENCOUNTER — Encounter: Payer: Self-pay | Admitting: Obstetrics and Gynecology

## 2015-11-15 VITALS — BP 133/83 | HR 79 | Ht 63.0 in | Wt 188.6 lb

## 2015-11-15 DIAGNOSIS — N889 Noninflammatory disorder of cervix uteri, unspecified: Secondary | ICD-10-CM

## 2015-11-15 DIAGNOSIS — D259 Leiomyoma of uterus, unspecified: Secondary | ICD-10-CM

## 2015-11-15 DIAGNOSIS — N939 Abnormal uterine and vaginal bleeding, unspecified: Secondary | ICD-10-CM

## 2015-11-15 MED ORDER — MEDROXYPROGESTERONE ACETATE 10 MG PO TABS: 30.0000 mg | ORAL_TABLET | Freq: Every day | ORAL | Status: DC

## 2015-11-15 NOTE — Addendum Note (Signed)
Addended by: Elouise Munroe on: 11/15/2015 10:51 AM   Modules accepted: Orders

## 2015-11-15 NOTE — Patient Instructions (Signed)
1. Cervical biopsy is done today 2. Pelvic ultrasound is scheduled 3. Return in 1 week after ultrasound for further management planning

## 2015-11-15 NOTE — Progress Notes (Signed)
Chief complaint: 1. Abnormal uterine bleeding 2. Pelvic pain 3. Uterine fibroids  At last evaluation 09/02/2015: Jill Davidson is a 45 y.o. [id number] female is here for gynecologic evaluation of the following issues:  1. Pelvic pain. 2. Menorrhagia.  Patient presents for evaluation Recurrent onset menorrhagia with associated abdominal pelvic pain that is minimally responsive to ibuprofen and Tylenol. She does have some radiation of her pain into her legs. She has known fibroid uterus. Ultrasound on 03/09/2014 demonstrated 3 small fibroids in the posterior fundal, anterior, fundal, and left lateral regions of the uterus. Endometrial thickness at that time was 2.3 mm and no biopsy was performed.The patient was contemplating IUD insertion, but decided against that intervention.  Endometrial biopsy on 09/01/2015: Pathology-chronic endometritis Patient has completed doxycycline 100 mg twice a day for 14 days. Patient is now on Provera 30 mg daily for suppression of abnormal uterine bleeding Patient has not gotten intravaginal ultrasound as recommended  Past Medical History  Diagnosis Date  . IDA (iron deficiency anemia) 01/25/2015  . Fibroid   . UTI (lower urinary tract infection)   . Bacterial vaginosis    Past Surgical History  Procedure Laterality Date  . Cesarean section    . Dilation and curettage of uterus      Review of systems: Chronic bleeding persists Pelvic pain persists No change in bowel or bladder function  OBJECTIVE: BP 133/83 mmHg  Pulse 79  Ht 5\' 3"  (1.6 m)  Wt 188 lb 9.6 oz (85.548 kg)  BMI 33.42 kg/m2  LMP 11/14/2015 Pleasant African-American female in no acute distress Abdomen: Soft, nontender Pelvic exam: External genitalia normal BUS-normal Vagina-normal Cervix-powder burn lesion at 11:00 on the cervix; mild cervical motion tenderness Uterus-irregular, 10-12 weeks size, possible anterior fibroid 4 cm palpable, mildly tender Adnexa-nonpalpable and  nontender Rectovaginal-normal external exam  PROCEDURE: Cervical biopsy 11:00 Tischlar forceps is used to remove lesion of cervix at 11:00; Monsel solution applied for hemostasis; procedure well-tolerated; minimal bleeding encountered  ASSESSMENT: 1. Abnormal uterine bleeding 2. Chronic pelvic pain 3. History of uterine fibroids 4. Cervical lesion  PLAN: 1. Cervical biopsy 2. Continue Provera 30 mg a day 3. Pelvic ultrasound 4. Return in 2 weeks for follow-up and further management planning 5. Consider Mirena IUD insertion  Brayton Mars, MD  Note: This dictation was prepared with Dragon dictation along with smaller phrase technology. Any transcriptional errors that result from this process are unintentional.

## 2015-11-17 LAB — PATHOLOGY

## 2015-11-22 ENCOUNTER — Ambulatory Visit (INDEPENDENT_AMBULATORY_CARE_PROVIDER_SITE_OTHER): Payer: BLUE CROSS/BLUE SHIELD

## 2015-11-22 DIAGNOSIS — N939 Abnormal uterine and vaginal bleeding, unspecified: Secondary | ICD-10-CM | POA: Diagnosis not present

## 2015-11-22 DIAGNOSIS — D259 Leiomyoma of uterus, unspecified: Secondary | ICD-10-CM | POA: Diagnosis not present

## 2015-11-23 ENCOUNTER — Ambulatory Visit: Payer: Medicaid Other | Admitting: Obstetrics and Gynecology

## 2015-11-29 ENCOUNTER — Encounter: Payer: Self-pay | Admitting: Obstetrics and Gynecology

## 2015-11-29 ENCOUNTER — Ambulatory Visit (INDEPENDENT_AMBULATORY_CARE_PROVIDER_SITE_OTHER): Payer: BLUE CROSS/BLUE SHIELD | Admitting: Obstetrics and Gynecology

## 2015-11-29 VITALS — BP 151/90 | HR 82 | Ht 63.0 in | Wt 195.3 lb

## 2015-11-29 DIAGNOSIS — D259 Leiomyoma of uterus, unspecified: Secondary | ICD-10-CM | POA: Diagnosis not present

## 2015-11-29 DIAGNOSIS — N920 Excessive and frequent menstruation with regular cycle: Secondary | ICD-10-CM | POA: Diagnosis not present

## 2015-11-29 DIAGNOSIS — N939 Abnormal uterine and vaginal bleeding, unspecified: Secondary | ICD-10-CM | POA: Diagnosis not present

## 2015-11-29 DIAGNOSIS — N889 Noninflammatory disorder of cervix uteri, unspecified: Secondary | ICD-10-CM | POA: Diagnosis not present

## 2015-11-29 NOTE — Progress Notes (Signed)
GYN ENCOUNTER NOTE  Subjective:       Jill Davidson is a 45 y.o. [phone] female is here for gynecologic evaluation of the following issues:  1. Abnormal uterine bleeding 2. Uterine fibroids 3. Pelvic pain   4.  Follow-up on cervical biopsy   Patient presents for 2-week follow-up of AUB, pelvic pain, and ultrasound  Ultrasound demonstrates clinically stable fibroid uterus. Biopsy of cervix for suspected endometriosis is benign No further abnormal bleeding while on Provera 30 mg a day. Patient desires Mirena IUD insertion  Gynecologic History Patient's last menstrual period was 11/14/2015. Contraception: none  Obstetric History OB History  Gravida Para Term Preterm AB SAB TAB Ectopic Multiple Living  6 4 4  2 2    4     # Outcome Date GA Lbr Len/2nd Weight Sex Delivery Anes PTL Lv  6 SAB 2010          5 SAB 1996          4 Term 1994   6 lb (2.722 kg) M VBAC   Y  3 Term 1992   7 lb (3.175 kg) M CS-LTranv   Y  2 Term 1991   6 lb (2.722 kg) M Vag-Spont   Y  1 Term 1989   7 lb 2.2 oz (3.239 kg) F Vag-Spont   Y      Past Medical History  Diagnosis Date  . IDA (iron deficiency anemia) 01/25/2015  . Fibroid   . UTI (lower urinary tract infection)   . Bacterial vaginosis     Past Surgical History  Procedure Laterality Date  . Cesarean section    . Dilation and curettage of uterus      Current Outpatient Prescriptions on File Prior to Visit  Medication Sig Dispense Refill  . medroxyPROGESTERone (PROVERA) 10 MG tablet Take 3 tablets (30 mg total) by mouth daily. 90 tablet 1   No current facility-administered medications on file prior to visit.    No Known Allergies  Social History   Social History  . Marital Status: Single    Spouse Name: N/A  . Number of Children: N/A  . Years of Education: N/A   Occupational History  . Not on file.   Social History Main Topics  . Smoking status: Current Every Day Smoker -- 0.50 packs/day    Types: Cigarettes  . Smokeless  tobacco: Not on file  . Alcohol Use: Yes     Comment: once a week  . Drug Use: Yes    Special: Marijuana     Comment: once a week  . Sexual Activity: Yes    Birth Control/ Protection: None   Other Topics Concern  . Not on file   Social History Narrative    Family History  Problem Relation Age of Onset  . Diabetes Father   . Colon cancer Maternal Aunt   . Breast cancer Maternal Aunt     great  . Heart disease Maternal Aunt   . Diabetes Maternal Grandmother   . Ovarian cancer Neg Hx     The following portions of the patient's history were reviewed and updated as appropriate: allergies, current medications, past family history, past medical history, past social history, past surgical history and problem list.  Review of Systems Review of Systems - General ROS: negative for - chills, fatigue, fever, hot flashes, malaise or night sweats Hematological and Lymphatic ROS: negative for - bleeding problems or swollen lymph nodes Gastrointestinal ROS: negative for - abdominal pain,  blood in stools, change in bowel habits and nausea/vomiting Musculoskeletal ROS: negative for - joint pain, muscle pain or muscular weakness Genito-Urinary ROS: negative for - change in menstrual cycle, dysmenorrhea, dyspareunia, dysuria, genital discharge, genital ulcers, hematuria, incontinence, irregular/heavy menses, nocturia or pelvic pain  Objective:   BP 151/90 mmHg  Pulse 82  Ht 5\' 3"  (1.6 m)  Wt 195 lb 4.8 oz (88.587 kg)  BMI 34.60 kg/m2  LMP 11/14/2015 Physical exam deferred     Assessment:   1. Abnormal uterine bleeding: currently resolved on Provera 30 mg a day 2. Chronic pelvic pain -  pelvic ultrasound showed unchanged fibroids  3. History of uterine fibroids 4.  Needs contraception and regulation of abnormal uterine bleeding 5.  Cervical biopsy-benign   Plan:   1. Continue Provera 30 mg a day 2. Cervical biopsy: benign  3. Return in 2 weeks for Mirena IUD insertion.   Continue Provera until IUD appt. 4.  Abstain from intercourse for 2 weeks prior to Mirena insertion  A total of 15 minutes were spent face-to-face with the patient during this encounter and over half of that time dealt with counseling and coordination of care.   Olene Floss, PA-S Brayton Mars, MD   I have seen, interviewed, and examined the patient in conjunction with the [facility].A. student and affirm the diagnosis and management plan. Geno Sydnor A. Teasia Zapf, MD, FACOG   Note: This dictation was prepared with Dragon dictation along with smaller phrase technology. Any transcriptional errors that result from this process are unintentional.

## 2015-11-29 NOTE — Patient Instructions (Signed)
1. Return in 2 weeks for Mirena IUD insertion 2. Take several Advil or Aleve prior to coming in for IUD insertion 3. No intercourse until IUD insertion

## 2015-12-14 ENCOUNTER — Ambulatory Visit (INDEPENDENT_AMBULATORY_CARE_PROVIDER_SITE_OTHER): Payer: BLUE CROSS/BLUE SHIELD | Admitting: Obstetrics and Gynecology

## 2015-12-14 ENCOUNTER — Encounter: Payer: Self-pay | Admitting: Obstetrics and Gynecology

## 2015-12-14 VITALS — BP 137/83 | HR 72 | Ht 63.0 in | Wt 193.4 lb

## 2015-12-14 DIAGNOSIS — Z30430 Encounter for insertion of intrauterine contraceptive device: Secondary | ICD-10-CM | POA: Diagnosis not present

## 2015-12-14 DIAGNOSIS — D259 Leiomyoma of uterus, unspecified: Secondary | ICD-10-CM | POA: Diagnosis not present

## 2015-12-14 DIAGNOSIS — N939 Abnormal uterine and vaginal bleeding, unspecified: Secondary | ICD-10-CM

## 2015-12-14 LAB — POCT URINE PREGNANCY: Preg Test, Ur: NEGATIVE

## 2015-12-14 MED ORDER — MEDROXYPROGESTERONE ACETATE 10 MG PO TABS: 30.0000 mg | ORAL_TABLET | Freq: Every day | ORAL | Status: DC

## 2015-12-15 NOTE — Progress Notes (Signed)
Chief complaint: 1. Abnormal uterine bleeding 2. Uterine fibroids 3. Pelvic pain 4. Mirena IUD insertion  Patient presents for Mirena IUD insertion  OBJECTIVE: BP 137/83 mmHg  Pulse 72  Ht '5\' 3"'  (1.6 m)  Wt 193 lb 6.4 oz (87.726 kg)  BMI 34.27 kg/m2  LMP 12/08/2015 (Exact Date)  PROCEDURE: Mirena IUD insertion Description: Patient was placed in the dorsal lithotomy position. Bimanual exam demonstrates a midplane slightly enlarged uterus. A Graves' speculum is placed. Cervix and vagina is cleansed with Betadine swabs. Using sterile technique a single-tooth tenaculum was placed on the anterior lip of the cervix. The uterus is sounded to 8 cm. Mild resistance on initial sounding was encountered. Mirena IUD was inserted in standard fashion. Upon releasing the IUD and extracting the applicator, the extractor was met with some resistance on removal and the IUD was expelled. A second Mirena device was obtained and similarly inserted. Again with removal of the extractor, a snugness was noted and the IUD was again expelled being incompletely deployed from the device. Procedure was then terminated. Patient tolerated the procedure well.  ASSESSMENT: 1. Unsuccessful Mirena IUD insertion, unclear etiology, possibly related to uterine distortion from fibroids versus defective insertion device (although this is not entirely likely due to the procedure failing twice) 2. Uterine fibroids 3. Abnormal uterine bleeding 4. Pelvic pain  PLAN: 1. Continue Provera 30 mg a day 2. Return for LAVH bilateral salpingectomy which is to be scheduled within the next month.  A total of 15 minutes were spent face-to-face with the patient during this encounter and over half of that time dealt with counseling and coordination of care.  Brayton Mars, MD  Note: This dictation was prepared with Dragon dictation along with smaller phrase technology. Any transcriptional errors that result from this process are  unintentional.

## 2015-12-15 NOTE — Patient Instructions (Signed)
1. Continue with Provera 30 mg a day 2. LAVH bilateral salpingectomy is scheduled 3. Return for preop appointment

## 2015-12-19 ENCOUNTER — Ambulatory Visit: Payer: BLUE CROSS/BLUE SHIELD | Admitting: Family Medicine

## 2016-02-16 ENCOUNTER — Telehealth: Payer: Self-pay | Admitting: Obstetrics and Gynecology

## 2016-02-16 MED ORDER — MEDROXYPROGESTERONE ACETATE 10 MG PO TABS: 30.0000 mg | ORAL_TABLET | Freq: Every day | ORAL | Status: DC

## 2016-02-16 NOTE — Telephone Encounter (Signed)
Pt needs Medorxypr AC 10 mg refilled [facility] [place])  [address] [place]

## 2016-02-16 NOTE — Telephone Encounter (Signed)
Pt aware med erx. Pt having insurance issues. Will call back to schedule surgery.

## 2016-05-15 ENCOUNTER — Other Ambulatory Visit: Payer: Self-pay | Admitting: Obstetrics and Gynecology

## 2016-06-11 ENCOUNTER — Other Ambulatory Visit: Payer: Self-pay | Admitting: Obstetrics and Gynecology

## 2016-06-11 ENCOUNTER — Telehealth: Payer: Self-pay | Admitting: Obstetrics and Gynecology

## 2016-06-11 NOTE — Telephone Encounter (Signed)
Pt has a question for surgery... hysterectomy vs Irving Copas sure

## 2016-06-11 NOTE — Telephone Encounter (Signed)
Pt wants to discuss ablation. Last not mad wants her to have a lavh. Appt made for 10/12 at 9:15. Pt doesn't have ins until 10/1.

## 2016-06-12 ENCOUNTER — Ambulatory Visit (INDEPENDENT_AMBULATORY_CARE_PROVIDER_SITE_OTHER): Payer: Self-pay | Admitting: Obstetrics and Gynecology

## 2016-06-12 ENCOUNTER — Encounter: Payer: Self-pay | Admitting: Obstetrics and Gynecology

## 2016-06-12 VITALS — BP 149/90 | HR 79 | Ht 63.0 in | Wt 192.7 lb

## 2016-06-12 DIAGNOSIS — D259 Leiomyoma of uterus, unspecified: Secondary | ICD-10-CM

## 2016-06-12 DIAGNOSIS — N920 Excessive and frequent menstruation with regular cycle: Secondary | ICD-10-CM

## 2016-06-12 MED ORDER — MEDROXYPROGESTERONE ACETATE 10 MG PO TABS: 30.0000 mg | ORAL_TABLET | Freq: Every day | ORAL | 1 refills | Status: DC

## 2016-06-12 NOTE — Patient Instructions (Addendum)
1. Continue Provera 30 mg a day 2. Hysteroscopy/D&C with NovaSure endometrial ablation is scheduled 3. Return for preop appointment Endometrial Ablation Endometrial ablation removes the lining of the uterus (endometrium). It is usually a same-day, outpatient treatment. Ablation helps avoid major surgery, such as surgery to remove the cervix and uterus (hysterectomy). After endometrial ablation, you will have little or no menstrual bleeding and may not be able to have children. However, if you are premenopausal, you will need to use a reliable method of birth control following the procedure because of the small chance that pregnancy can occur. There are different reasons to have this procedure. These reasons include:  Heavy periods.  Bleeding that is causing anemia.  Irregular bleeding.  Bleeding fibroids on the lining inside the uterus if they are smaller than 3 centimeters. This procedure may not be possible for you if:   You want to have children in the future.   You have severe cramps with your menstrual period.   You have precancerous or cancerous cells in your uterus.   You were recently pregnant.   You have gone through menopause.   You have had major surgery on your uterus, resulting in thinning of the uterine wall. Surgeries may include:  The removal of one or more uterine fibroids (myomectomy).  A cesarean section with a classic (vertical) incision on your uterus. Ask your health care provider what type of cesarean you had. Sometimes the scar on your skin is different than the scar on your uterus. Even if you have had surgery on your uterus, certain types of ablation may still be safe for you. Talk with your health care provider. LET [facility] CARE PROVIDER KNOW ABOUT:  Any allergies you have.  All medicines you are taking, including vitamins, herbs, eye drops, creams, and over-the-counter medicines.  Previous problems you or members of your family have had with  the use of anesthetics.  Any blood disorders you have.  Previous surgeries you have had.  Medical conditions you have. RISKS AND COMPLICATIONS  Generally, this is a safe procedure. However, as with any procedure, complications can occur. Possible complications include:  Perforation of the uterus.  Bleeding.  Infection of the uterus, bladder, or vagina.  Injury to surrounding organs.  An air bubble to the lung (air embolus).  Pregnancy following the procedure.  Failure of the procedure to help the problem, requiring hysterectomy.  Decreased ability to diagnose cancer in the lining of the uterus. BEFORE THE PROCEDURE  The lining of the uterus must be tested to make sure there is no pre-cancerous or cancer cells present.  An ultrasound may be performed to look at the size of the uterus and to check for abnormalities.  Medicines may be given to thin the lining of the uterus. PROCEDURE  During the procedure, your health care provider will use a tool called a resectoscope to help see inside your uterus. There are different ways to remove the lining of your uterus.   Radiofrequency - This method uses a radiofrequency-alternating electric current to remove the lining of the uterus.  Cryotherapy - This method uses extreme cold to freeze the lining of the uterus.  Heated-Free Liquid - This method uses heated salt (saline) solution to remove the lining of the uterus.  Microwave - This method uses high-energy microwaves to heat up the lining of the uterus to remove it.  Thermal balloon - This method involves inserting a catheter with a balloon tip into the uterus. The balloon tip  is filled with heated fluid to remove the lining of the uterus. AFTER THE PROCEDURE  After your procedure, do not have sexual intercourse or insert anything into your vagina until permitted by your health care provider. After the procedure, you may experience:  Cramps.  Vaginal discharge.  Frequent  urination.   This information is not intended to replace advice given to you by your health care provider. Make sure you discuss any questions you have with your health care provider.   Document Released: 07/13/2004 Document Revised: 05/25/2015 Document Reviewed: 02/04/2013 Elsevier Interactive Patient Education [employer].

## 2016-06-12 NOTE — Progress Notes (Signed)
Chief complaint: 1. History of abnormal uterine bleeding, on Provera suppression 2. History of uterine fibroids 3. History of pelvic pain  Patient presents for follow-up. She has run out of Provera 30 mg a day which has been taking consistently for suppression of and normal uterine bleeding; she has not had any bleeding since being on the daily dosing. She is not experiencing any significant pelvic pain.  Previous ultrasound demonstrated 3 distinct fibroids within the uterus, fundal region  Patient was contemplating LAVH; however, she is interested in conservative surgery in the form of NovaSure endometrial ablation.  Past medical history, past surgical history, problem list, medications, and allergies are reviewed  OBJECTIVE: BP (!) 149/90   Pulse 79   Ht 5\' 3"  (1.6 m)   Wt 192 lb 11.2 oz (87.4 kg)   LMP 05/12/2016 (Approximate)   BMI 34.14 kg/m  Pleasant female in no acute distress Abdomen: Soft, nontender; no organomegaly Pelvic exam: External genitalia-normal BUS-normal Vagina-normal Cervix-parous; no lesions; no cervical motion tenderness Uterus-anteverted enlarged 10-12 weeks, mobile, nontender Adnexa-nonpalpable nontender Rectovaginal normal external exam  ASSESSMENT: 1. History of abnormal uterine bleeding on continuous Provera suppression 30 mg a day, well-tolerated 2. Uterine fibroids 3. Contemplating surgical management of this condition  PLAN: 1. Pros/cons of LAVH versus NovaSure endometrial ablation were discussed 2. Patient desires to proceed with NovaSure endometrial ablation, hysteroscopy/D&C 3. Return in 3 weeks for preoperative appointment 4. Continue Provera 30 mg daily  A total of 15 minutes were spent face-to-face with the patient during this encounter and over half of that time dealt with counseling and coordination of care.  Brayton Mars, MD  Note: This dictation was prepared with Dragon dictation along with smaller phrase technology. Any  transcriptional errors that result from this process are unintentional.

## 2016-06-28 ENCOUNTER — Ambulatory Visit: Payer: Medicaid Other | Admitting: Obstetrics and Gynecology

## 2016-07-05 ENCOUNTER — Encounter: Payer: Self-pay | Admitting: Obstetrics and Gynecology

## 2016-07-12 ENCOUNTER — Ambulatory Visit: Payer: Medicaid Other | Admitting: Family Medicine

## 2016-07-12 MED ORDER — SODIUM CHLORIDE FLUSH 0.9 % IV SOLN
INTRAVENOUS | Status: AC
  Filled 2016-07-12: qty 10

## 2016-07-17 ENCOUNTER — Encounter: Payer: Self-pay | Admitting: Obstetrics and Gynecology

## 2016-07-17 ENCOUNTER — Ambulatory Visit (INDEPENDENT_AMBULATORY_CARE_PROVIDER_SITE_OTHER): Payer: BLUE CROSS/BLUE SHIELD | Admitting: Obstetrics and Gynecology

## 2016-07-17 VITALS — BP 165/94 | HR 65 | Ht 63.0 in | Wt 198.3 lb

## 2016-07-17 DIAGNOSIS — Z01818 Encounter for other preprocedural examination: Secondary | ICD-10-CM

## 2016-07-17 DIAGNOSIS — D259 Leiomyoma of uterus, unspecified: Secondary | ICD-10-CM

## 2016-07-17 DIAGNOSIS — N920 Excessive and frequent menstruation with regular cycle: Secondary | ICD-10-CM

## 2016-07-17 NOTE — Progress Notes (Signed)
Subjective:  PREOPERATIVE HISTORY AND PHYSICAL       Date of surgery: 07/23/2016 Procedure: Hysteroscopy/D&C with NovaSure endometrial ablation  Patient is a 45 y.o. [id number] scheduled Hysteroscopy/D&C with NovaSure endometrial ablation on 07/23/2016. Patient has history of abnormal uterine bleeding; currently On Provera 30 mg daily. History of uterine fibroids, 3, located within the fundal region. History of chronic endometritis in December 2016; treated with doxycycline 100 mg twice a day for 14 days. Patient does not desire IUD  11/22/2015 ultrasound demonstrates 3 fundal fibroids, measuring 3.2 cm, 1.9 cm, and 3.5 cm. Right ovary was normal. Left ovary demonstrated simple cyst measuring 3.3 cm. Endometrial stripe was 5.3 mm  Pertinent Gynecological History: Patient's last menstrual period was Menarche age 36. Past interval is 21-23 days with heavy moderate bleeding lasting upwards of 8 days. She needs multiple pads daily to control clots. Patient does report worsening dysmenorrhea with radiation to back and legs over the past 4 years. History of anemia from menorrhagia which has required a transfusion in the past    Menstrual History: OB History    Gravida Para Term Preterm AB Living   6 4 4   2 4    SAB TAB Ectopic Multiple Live Births   2       10      Menarche age: na No LMP recorded (lmp unknown).    Past Medical History:  Diagnosis Date  . AR (allergic rhinitis)   . Bacterial vaginosis   . Fibroid   . IDA (iron deficiency anemia) 01/25/2015  . UTI (lower urinary tract infection)     Past Surgical History:  Procedure Laterality Date  . CESAREAN SECTION    . DILATION AND CURETTAGE OF UTERUS      OB History  Gravida Para Term Preterm AB Living  6 4 4   2 4   SAB TAB Ectopic Multiple Live Births  2       4    # Outcome Date GA Lbr Len/2nd Weight Sex Delivery Anes PTL Lv  6 SAB 2010          5 SAB 1996          4 Term 1994   6 lb (2.722 kg) M VBAC   LIV   3 Term 1992   7 lb (3.175 kg) M CS-LTranv   LIV  2 Term 1991   6 lb (2.722 kg) M Vag-Spont   LIV  1 Term 1989   7 lb 2.2 oz (3.239 kg) F Vag-Spont   LIV      Social History   Social History  . Marital status: Single    Spouse name: N/A  . Number of children: N/A  . Years of education: N/A   Social History Main Topics  . Smoking status: Current Every Day Smoker    Packs/day: 0.50    Types: Cigarettes  . Smokeless tobacco: Never Used  . Alcohol use Yes     Comment: once a week  . Drug use:     Types: Marijuana     Comment: once a week  . Sexual activity: Yes    Birth control/ protection: None   Other Topics Concern  . None   Social History Narrative  . None    Family History  Problem Relation Age of Onset  . Diabetes Father   . Colon cancer Maternal Aunt   . Breast cancer Maternal Aunt     great  . Heart disease Maternal Aunt   .  Diabetes Maternal Grandmother   . Ovarian cancer Neg Hx      (Not in a hospital admission)  No Known Allergies  Review of Systems Constitutional: No recent fever/chills/sweats Respiratory: No recent cough/bronchitis Cardiovascular: No chest pain Gastrointestinal: No recent nausea/vomiting/diarrhea Genitourinary: No UTI symptoms Hematologic/lymphatic:No history of coagulopathy or recent blood thinner use    Objective:    BP (!) 165/94   Pulse 65   Ht 5\' 3"  (1.6 m)   Wt 198 lb 4.8 oz (89.9 kg)   LMP  (LMP Unknown)   BMI 35.13 kg/m   General:   Normal  Skin:   normal  HEENT:  Normal  Neck:  Supple without Adenopathy or Thyromegaly  Lungs:   Heart:              Breasts:   Abdomen:  Pelvis:  M/S   Extremeties:  Neuro:    clear to auscultation bilaterally   Normal without murmur   Not Examined   soft, non-tender; bowel sounds normal; no masses,  no organomegaly   Exam deferred to OR  No CVAT  Warm/Dry   Normal          Assessment:    1. Abnormal uterine bleeding 2. Fibroid uterus   Plan:   1.  Hysteroscopy/D&C with NovaSure endometrial ablation  Preoperative counseling: Patient is to undergo lab hysteroscopy with D&C and NovaSure endometrial ablation on 07/22/2016. She is understanding of the planned procedure and is aware of and is and is accepting of all surgical risks which include but are not limited to bleeding, infection, pelvic organ injury with need for repair, blood clot disorders, anesthesia risks, etc. All questions have been answered. Consent is given. Patient is ready and willing with surgery as scheduled.  Brayton Mars, MD  Note: This dictation was prepared with Dragon dictation along with smaller phrase technology. Any transcriptional errors that result from this process are unintentional.

## 2016-07-17 NOTE — Patient Instructions (Signed)
1. Return in 1 week for postop check after surgery 

## 2016-07-17 NOTE — H&P (Signed)
Subjective:  PREOPERATIVE HISTORY AND PHYSICAL       Date of surgery: 07/23/2016 Procedure: Hysteroscopy/D&C with NovaSure endometrial ablation  Patient is a 45 y.o. [id number] scheduled Hysteroscopy/D&C with NovaSure endometrial ablation on 07/23/2016. Patient has history of abnormal uterine bleeding; currently On Provera 30 mg daily. History of uterine fibroids, 3, located within the fundal region. History of chronic endometritis in December 2016; treated with doxycycline 100 mg twice a day for 14 days. Patient does not desire IUD  11/22/2015 ultrasound demonstrates 3 fundal fibroids, measuring 3.2 cm, 1.9 cm, and 3.5 cm. Right ovary was normal. Left ovary demonstrated simple cyst measuring 3.3 cm. Endometrial stripe was 5.3 mm  Pertinent Gynecological History: Patient's last menstrual period was Menarche age 23. Past interval is 21-23 days with heavy moderate bleeding lasting upwards of 8 days. She needs multiple pads daily to control clots. Patient does report worsening dysmenorrhea with radiation to back and legs over the past 4 years. History of anemia from menorrhagia which has required a transfusion in the past    Menstrual History: OB History    Gravida Para Term Preterm AB Living   6 4 4   2 4    SAB TAB Ectopic Multiple Live Births   2       4      Menarche age: na No LMP recorded (lmp unknown).    Past Medical History:  Diagnosis Date  . AR (allergic rhinitis)   . Bacterial vaginosis   . Fibroid   . IDA (iron deficiency anemia) 01/25/2015  . UTI (lower urinary tract infection)     Past Surgical History:  Procedure Laterality Date  . CESAREAN SECTION    . DILATION AND CURETTAGE OF UTERUS      OB History  Gravida Para Term Preterm AB Living  6 4 4   2 4   SAB TAB Ectopic Multiple Live Births  2       4    # Outcome Date GA Lbr Len/2nd Weight Sex Delivery Anes PTL Lv  6 SAB 2010          5 SAB 1996          4 Term 1994   6 lb (2.722 kg) M VBAC   LIV   3 Term 1992   7 lb (3.175 kg) M CS-LTranv   LIV  2 Term 1991   6 lb (2.722 kg) M Vag-Spont   LIV  1 Term 1989   7 lb 2.2 oz (3.239 kg) F Vag-Spont   LIV      Social History   Social History  . Marital status: Single    Spouse name: N/A  . Number of children: N/A  . Years of education: N/A   Social History Main Topics  . Smoking status: Current Every Day Smoker    Packs/day: 0.50    Types: Cigarettes  . Smokeless tobacco: Never Used  . Alcohol use Yes     Comment: once a week  . Drug use:     Types: Marijuana     Comment: once a week  . Sexual activity: Yes    Birth control/ protection: None   Other Topics Concern  . None   Social History Narrative  . None    Family History  Problem Relation Age of Onset  . Diabetes Father   . Colon cancer Maternal Aunt   . Breast cancer Maternal Aunt     great  . Heart disease Maternal Aunt   .  Diabetes Maternal Grandmother   . Ovarian cancer Neg Hx      (Not in a hospital admission)  No Known Allergies  Review of Systems Constitutional: No recent fever/chills/sweats Respiratory: No recent cough/bronchitis Cardiovascular: No chest pain Gastrointestinal: No recent nausea/vomiting/diarrhea Genitourinary: No UTI symptoms Hematologic/lymphatic:No history of coagulopathy or recent blood thinner use    Objective:    BP (!) 165/94   Pulse 65   Ht 5\' 3"  (1.6 m)   Wt 198 lb 4.8 oz (89.9 kg)   LMP  (LMP Unknown)   BMI 35.13 kg/m   General:   Normal  Skin:   normal  HEENT:  Normal  Neck:  Supple without Adenopathy or Thyromegaly  Lungs:   Heart:              Breasts:   Abdomen:  Pelvis:  M/S   Extremeties:  Neuro:    clear to auscultation bilaterally   Normal without murmur   Not Examined   soft, non-tender; bowel sounds normal; no masses,  no organomegaly   Exam deferred to OR  No CVAT  Warm/Dry   Normal          Assessment:    1. Abnormal uterine bleeding 2. Fibroid uterus   Plan:   1.  Hysteroscopy/D&C with NovaSure endometrial ablation  Preoperative counseling: Patient is to undergo lab hysteroscopy with D&C and NovaSure endometrial ablation on 07/22/2016. She is understanding of the planned procedure and is aware of and is and is accepting of all surgical risks which include but are not limited to bleeding, infection, pelvic organ injury with need for repair, blood clot disorders, anesthesia risks, etc. All questions have been answered. Consent is given. Patient is ready and willing with surgery as scheduled.  Brayton Mars, MD  Note: This dictation was prepared with Dragon dictation along with smaller phrase technology. Any transcriptional errors that result from this process are unintentional.

## 2016-07-18 NOTE — Patient Instructions (Signed)
  Your procedure is scheduled on: 07-23-16 [facility]) Report to Same Day Surgery 2nd floor medical mall To find out your arrival time please call [phone] between 1PM - 3PM on 07-20-16 (FRIDAY)  Remember: Instructions that are not followed completely may result in serious medical risk, up to and including death, or upon the discretion of your surgeon and anesthesiologist your surgery may need to be rescheduled.    _x___ 1. Do not eat food or drink liquids after midnight. No gum chewing or hard candies.     __x__ 2. No Alcohol for 24 hours before or after surgery.   __x__3. No Smoking for 24 prior to surgery.   ____  4. Bring all medications with you on the day of surgery if instructed.    __x__ 5. Notify your doctor if there is any change in your medical condition     (cold, fever, infections).     Do not wear jewelry, make-up, hairpins, clips or nail polish.  Do not wear lotions, powders, or perfumes. You may wear deodorant.  Do not shave 48 hours prior to surgery. Men may shave face and neck.  Do not bring valuables to the hospital.    [facility] is not responsible for any belongings or valuables.               Contacts, dentures or bridgework may not be worn into surgery.  Leave your suitcase in the car. After surgery it may be brought to your room.  For patients admitted to the hospital, discharge time is determined by your treatment team.   Patients discharged the day of surgery will not be allowed to drive home.    Please read over the following fact sheets that you were given:   [facility] Preparing for Surgery and or MRSA Information   ____ Take these medicines the morning of surgery with A SIP OF WATER:    1. NONE  2.  3.  4.  5.  6.  ____Fleets enema or Magnesium Citrate as directed.   ____ Use CHG Soap or sage wipes as directed on instruction sheet   ____ Use inhalers on the day of surgery and bring to hospital day of surgery  ____ Stop metformin 2 days  prior to surgery    ____ Take 1/2 of usual insulin dose the night before surgery and none on the morning of  surgery.   ____ Stop aspirin or coumadin, or plavix  x__ Stop Anti-inflammatories such as Advil, Aleve, Ibuprofen, Motrin, Naproxen,          Naprosyn, Goodies powders or aspirin products-STOP IBUPROFEN NOW- Ok to take Tylenol.   ____ Stop supplements until after surgery.    ____ Bring C-Pap to the hospital.

## 2016-07-20 ENCOUNTER — Encounter
Admission: RE | Admit: 2016-07-20 | Discharge: 2016-07-20 | Disposition: A | Discharge status: Home / Self Care | Payer: BLUE CROSS/BLUE SHIELD | Source: Ambulatory Visit | Attending: Obstetrics and Gynecology | Admitting: Obstetrics and Gynecology

## 2016-07-20 DIAGNOSIS — K219 Gastro-esophageal reflux disease without esophagitis: Secondary | ICD-10-CM | POA: Diagnosis not present

## 2016-07-20 DIAGNOSIS — Z800 Family history of malignant neoplasm of digestive organs: Secondary | ICD-10-CM | POA: Diagnosis not present

## 2016-07-20 DIAGNOSIS — Z6835 Body mass index (BMI) 35.0-35.9, adult: Secondary | ICD-10-CM | POA: Diagnosis not present

## 2016-07-20 DIAGNOSIS — D259 Leiomyoma of uterus, unspecified: Secondary | ICD-10-CM | POA: Diagnosis not present

## 2016-07-20 DIAGNOSIS — Z01812 Encounter for preprocedural laboratory examination: Secondary | ICD-10-CM

## 2016-07-20 DIAGNOSIS — F17210 Nicotine dependence, cigarettes, uncomplicated: Secondary | ICD-10-CM | POA: Diagnosis not present

## 2016-07-20 DIAGNOSIS — N939 Abnormal uterine and vaginal bleeding, unspecified: Secondary | ICD-10-CM | POA: Insufficient documentation

## 2016-07-20 DIAGNOSIS — Z833 Family history of diabetes mellitus: Secondary | ICD-10-CM | POA: Diagnosis not present

## 2016-07-20 DIAGNOSIS — Z803 Family history of malignant neoplasm of breast: Secondary | ICD-10-CM | POA: Diagnosis not present

## 2016-07-20 DIAGNOSIS — Z8249 Family history of ischemic heart disease and other diseases of the circulatory system: Secondary | ICD-10-CM | POA: Diagnosis not present

## 2016-07-20 DIAGNOSIS — E669 Obesity, unspecified: Secondary | ICD-10-CM | POA: Diagnosis not present

## 2016-07-20 DIAGNOSIS — N920 Excessive and frequent menstruation with regular cycle: Secondary | ICD-10-CM | POA: Diagnosis not present

## 2016-07-20 DIAGNOSIS — D649 Anemia, unspecified: Secondary | ICD-10-CM | POA: Diagnosis not present

## 2016-07-20 HISTORY — DX: Cardiac arrhythmia, unspecified: I49.9

## 2016-07-20 HISTORY — DX: Gastro-esophageal reflux disease without esophagitis: K21.9

## 2016-07-20 HISTORY — DX: Adverse effect of unspecified anesthetic, initial encounter: T41.45XA

## 2016-07-20 LAB — CBC WITH DIFFERENTIAL/PLATELET
BASOS PCT: 1 %
Basophils Absolute: 0.1 10*3/uL (ref 0–0.1)
EOS ABS: 0.1 10*3/uL (ref 0–0.7)
EOS PCT: 2 %
HCT: 34.6 % — ABNORMAL LOW (ref 35.0–47.0)
Hemoglobin: 11.8 g/dL — ABNORMAL LOW (ref 12.0–16.0)
LYMPHS ABS: 3.1 10*3/uL (ref 1.0–3.6)
Lymphocytes Relative: 30 %
MCH: 27.2 pg (ref 26.0–34.0)
MCHC: 34 g/dL (ref 32.0–36.0)
MCV: 79.9 fL — ABNORMAL LOW (ref 80.0–100.0)
MONOS PCT: 5 %
Monocytes Absolute: 0.5 10*3/uL (ref 0.2–0.9)
NEUTROS PCT: 62 %
Neutro Abs: 6.2 10*3/uL (ref 1.4–6.5)
PLATELETS: 292 10*3/uL (ref 150–440)
RBC: 4.33 MIL/uL (ref 3.80–5.20)
RDW: 18.3 % — AB (ref 11.5–14.5)
WBC: 10.1 10*3/uL (ref 3.6–11.0)

## 2016-07-20 LAB — RAPID HIV SCREEN (HIV 1/2 AB+AG)
HIV 1/2 ANTIBODIES: NONREACTIVE
HIV-1 P24 ANTIGEN - HIV24: NONREACTIVE

## 2016-07-20 LAB — TYPE AND SCREEN
ABO/RH(D): A POS
Antibody Screen: NEGATIVE

## 2016-07-21 LAB — RPR: RPR Ser Ql: NONREACTIVE

## 2016-07-23 ENCOUNTER — Ambulatory Visit: Payer: BLUE CROSS/BLUE SHIELD | Admitting: Anesthesiology

## 2016-07-23 ENCOUNTER — Encounter
Admission: RE | Disposition: A | Discharge status: Home / Self Care | Payer: Self-pay | Source: Ambulatory Visit | Attending: Obstetrics and Gynecology

## 2016-07-23 ENCOUNTER — Encounter: Payer: Self-pay | Admitting: *Deleted

## 2016-07-23 ENCOUNTER — Ambulatory Visit
Admission: RE | Admit: 2016-07-23 | Discharge: 2016-07-23 | Disposition: A | Discharge status: Home / Self Care | Payer: BLUE CROSS/BLUE SHIELD | Source: Ambulatory Visit | Attending: Obstetrics and Gynecology | Admitting: Obstetrics and Gynecology

## 2016-07-23 DIAGNOSIS — Z9889 Other specified postprocedural states: Secondary | ICD-10-CM

## 2016-07-23 DIAGNOSIS — E669 Obesity, unspecified: Secondary | ICD-10-CM | POA: Insufficient documentation

## 2016-07-23 DIAGNOSIS — Z803 Family history of malignant neoplasm of breast: Secondary | ICD-10-CM | POA: Diagnosis not present

## 2016-07-23 DIAGNOSIS — D649 Anemia, unspecified: Secondary | ICD-10-CM | POA: Insufficient documentation

## 2016-07-23 DIAGNOSIS — M9390 Osteochondropathy, unspecified of unspecified site: Secondary | ICD-10-CM | POA: Diagnosis not present

## 2016-07-23 DIAGNOSIS — F17210 Nicotine dependence, cigarettes, uncomplicated: Secondary | ICD-10-CM | POA: Insufficient documentation

## 2016-07-23 DIAGNOSIS — D259 Leiomyoma of uterus, unspecified: Secondary | ICD-10-CM | POA: Diagnosis not present

## 2016-07-23 DIAGNOSIS — Z8249 Family history of ischemic heart disease and other diseases of the circulatory system: Secondary | ICD-10-CM | POA: Insufficient documentation

## 2016-07-23 DIAGNOSIS — K219 Gastro-esophageal reflux disease without esophagitis: Secondary | ICD-10-CM | POA: Diagnosis not present

## 2016-07-23 DIAGNOSIS — Z833 Family history of diabetes mellitus: Secondary | ICD-10-CM | POA: Diagnosis not present

## 2016-07-23 DIAGNOSIS — Z800 Family history of malignant neoplasm of digestive organs: Secondary | ICD-10-CM | POA: Diagnosis not present

## 2016-07-23 DIAGNOSIS — Z6835 Body mass index (BMI) 35.0-35.9, adult: Secondary | ICD-10-CM | POA: Insufficient documentation

## 2016-07-23 DIAGNOSIS — N926 Irregular menstruation, unspecified: Secondary | ICD-10-CM | POA: Diagnosis not present

## 2016-07-23 DIAGNOSIS — N920 Excessive and frequent menstruation with regular cycle: Secondary | ICD-10-CM | POA: Insufficient documentation

## 2016-07-23 HISTORY — PX: DILITATION & CURRETTAGE/HYSTROSCOPY WITH NOVASURE ABLATION: SHX5568

## 2016-07-23 LAB — URINE DRUG SCREEN, QUALITATIVE (ARMC ONLY)
AMPHETAMINES, UR SCREEN: NOT DETECTED
BENZODIAZEPINE, UR SCRN: NOT DETECTED
Barbiturates, Ur Screen: NOT DETECTED
CANNABINOID 50 NG, UR ~~LOC~~: POSITIVE — AB
Cocaine Metabolite,Ur ~~LOC~~: NOT DETECTED
MDMA (Ecstasy)Ur Screen: NOT DETECTED
Methadone Scn, Ur: NOT DETECTED
Opiate, Ur Screen: NOT DETECTED
PHENCYCLIDINE (PCP) UR S: NOT DETECTED
TRICYCLIC, UR SCREEN: NOT DETECTED

## 2016-07-23 SURGERY — DILATATION & CURETTAGE/HYSTEROSCOPY WITH NOVASURE ABLATION
Anesthesia: General | Site: Vagina | Wound class: Clean Contaminated

## 2016-07-23 MED ORDER — KETOROLAC TROMETHAMINE 30 MG/ML IJ SOLN
INTRAMUSCULAR | Status: DC | PRN
  Administered 2016-07-23: 30 mg via INTRAVENOUS

## 2016-07-23 MED ORDER — ONDANSETRON HCL 4 MG/2ML IJ SOLN
INTRAMUSCULAR | Status: DC | PRN
  Administered 2016-07-23: 4 mg via INTRAVENOUS

## 2016-07-23 MED ORDER — FENTANYL CITRATE (PF) 100 MCG/2ML IJ SOLN
INTRAMUSCULAR | Status: DC | PRN
  Administered 2016-07-23: 50 ug via INTRAVENOUS
  Administered 2016-07-23 (×2): 25 ug via INTRAVENOUS

## 2016-07-23 MED ORDER — HYDRALAZINE HCL 20 MG/ML IJ SOLN
INTRAMUSCULAR | Status: AC
  Filled 2016-07-23: qty 1

## 2016-07-23 MED ORDER — FAMOTIDINE 20 MG PO TABS
ORAL_TABLET | ORAL | Status: AC
  Filled 2016-07-23: qty 1

## 2016-07-23 MED ORDER — LACTATED RINGERS IV SOLN
INTRAVENOUS | Status: DC
  Administered 2016-07-23 (×3): via INTRAVENOUS

## 2016-07-23 MED ORDER — GLYCOPYRROLATE 0.2 MG/ML IJ SOLN
INTRAMUSCULAR | Status: DC | PRN
  Administered 2016-07-23: 0.2 mg via INTRAVENOUS

## 2016-07-23 MED ORDER — PROPOFOL 10 MG/ML IV BOLUS
INTRAVENOUS | Status: DC | PRN
  Administered 2016-07-23: 140 mg via INTRAVENOUS

## 2016-07-23 MED ORDER — FAMOTIDINE 20 MG PO TABS
20.0000 mg | ORAL_TABLET | Freq: Once | ORAL | Status: AC
  Administered 2016-07-23: 20 mg via ORAL

## 2016-07-23 MED ORDER — DEXAMETHASONE SODIUM PHOSPHATE 10 MG/ML IJ SOLN
INTRAMUSCULAR | Status: DC | PRN
  Administered 2016-07-23: 10 mg via INTRAVENOUS

## 2016-07-23 MED ORDER — MIDAZOLAM HCL 2 MG/2ML IJ SOLN
INTRAMUSCULAR | Status: DC | PRN
Start: 2016-07-23 — End: 2016-07-23
  Administered 2016-07-23: 2 mg via INTRAVENOUS

## 2016-07-23 MED ORDER — LIDOCAINE HCL (CARDIAC) 20 MG/ML IV SOLN
INTRAVENOUS | Status: DC | PRN
  Administered 2016-07-23: 100 mg via INTRAVENOUS

## 2016-07-23 MED ORDER — OXYCODONE-ACETAMINOPHEN 5-325 MG PO TABS: 1.0000 | ORAL_TABLET | ORAL | 0 refills | Status: DC | PRN

## 2016-07-23 MED ORDER — LACTATED RINGERS IV SOLN: INTRAVENOUS | Status: DC

## 2016-07-23 MED ORDER — HYDRALAZINE HCL 20 MG/ML IJ SOLN
10.0000 mg | Freq: Once | INTRAMUSCULAR | Status: AC
  Administered 2016-07-23: 10 mg via INTRAVENOUS

## 2016-07-23 SURGICAL SUPPLY — 13 items
CATH ROBINSON RED A/P 16FR (CATHETERS) ×2 IMPLANT
COVER LIGHT HANDLE STERIS (MISCELLANEOUS) ×2 IMPLANT
GLOVE BIO SURGEON STRL SZ8 (GLOVE) ×2 IMPLANT
GOWN STRL REUS W/ TWL LRG LVL3 (GOWN DISPOSABLE) ×1 IMPLANT
GOWN STRL REUS W/ TWL XL LVL3 (GOWN DISPOSABLE) ×1 IMPLANT
GOWN STRL REUS W/TWL LRG LVL3 (GOWN DISPOSABLE) ×1
GOWN STRL REUS W/TWL XL LVL3 (GOWN DISPOSABLE) ×1
IV LACTATED RINGERS 1000ML (IV SOLUTION) ×2 IMPLANT
KIT RM TURNOVER CYSTO AR (KITS) ×2 IMPLANT
NOVASURE ENDOMETRIAL ABLATION (MISCELLANEOUS) ×2 IMPLANT
PACK DNC HYST (MISCELLANEOUS) ×2 IMPLANT
PAD OB MATERNITY 4.3X12.25 (PERSONAL CARE ITEMS) ×2 IMPLANT
PAD PREP 24X41 OB/GYN DISP (PERSONAL CARE ITEMS) ×2 IMPLANT

## 2016-07-23 NOTE — Anesthesia Postprocedure Evaluation (Signed)
Anesthesia Post Note  Patient: GETSEMANI VOROS  Procedure(s) Performed: Procedure(s) (LRB): DILATATION & CURETTAGE/HYSTEROSCOPY WITH NOVASURE ABLATION (N/A)  Patient location during evaluation: PACU Anesthesia Type: General Level of consciousness: awake and alert Pain management: pain level controlled Vital Signs Assessment: post-procedure vital signs reviewed and stable Respiratory status: spontaneous breathing, nonlabored ventilation, respiratory function stable and patient connected to nasal cannula oxygen Cardiovascular status: blood pressure returned to baseline and stable Postop Assessment: no signs of nausea or vomiting Anesthetic complications: no    Last Vitals:  Vitals:   07/23/16 1841 07/23/16 1903  BP: (!) 170/91 (!) 171/89  Pulse: 72 81  Resp: 18 18  Temp: 36.4 C     Last Pain:  Vitals:   07/23/16 1841  TempSrc: Temporal  PainSc:                  Precious Haws Piscitello

## 2016-07-23 NOTE — H&P (View-Only) (Signed)
Subjective:  PREOPERATIVE HISTORY AND PHYSICAL       Date of surgery: 07/23/2016 Procedure: Hysteroscopy/D&C with NovaSure endometrial ablation  Patient is a 45 y.o. [id number] scheduled Hysteroscopy/D&C with NovaSure endometrial ablation on 07/23/2016. Patient has history of abnormal uterine bleeding; currently On Provera 30 mg daily. History of uterine fibroids, 3, located within the fundal region. History of chronic endometritis in December 2016; treated with doxycycline 100 mg twice a day for 14 days. Patient does not desire IUD  11/22/2015 ultrasound demonstrates 3 fundal fibroids, measuring 3.2 cm, 1.9 cm, and 3.5 cm. Right ovary was normal. Left ovary demonstrated simple cyst measuring 3.3 cm. Endometrial stripe was 5.3 mm  Pertinent Gynecological History: Patient's last menstrual period was Menarche age 79. Past interval is 21-23 days with heavy moderate bleeding lasting upwards of 8 days. She needs multiple pads daily to control clots. Patient does report worsening dysmenorrhea with radiation to back and legs over the past 4 years. History of anemia from menorrhagia which has required a transfusion in the past    Menstrual History: OB History    Gravida Para Term Preterm AB Living   6 4 4   2 4    SAB TAB Ectopic Multiple Live Births   2       4      Menarche age: na No LMP recorded (lmp unknown).    Past Medical History:  Diagnosis Date  . AR (allergic rhinitis)   . Bacterial vaginosis   . Fibroid   . IDA (iron deficiency anemia) 01/25/2015  . UTI (lower urinary tract infection)     Past Surgical History:  Procedure Laterality Date  . CESAREAN SECTION    . DILATION AND CURETTAGE OF UTERUS      OB History  Gravida Para Term Preterm AB Living  6 4 4   2 4   SAB TAB Ectopic Multiple Live Births  2       4    # Outcome Date GA Lbr Len/2nd Weight Sex Delivery Anes PTL Lv  6 SAB 2010          5 SAB 1996          4 Term 1994   6 lb (2.722 kg) M VBAC   LIV   3 Term 1992   7 lb (3.175 kg) M CS-LTranv   LIV  2 Term 1991   6 lb (2.722 kg) M Vag-Spont   LIV  1 Term 1989   7 lb 2.2 oz (3.239 kg) F Vag-Spont   LIV      Social History   Social History  . Marital status: Single    Spouse name: N/A  . Number of children: N/A  . Years of education: N/A   Social History Main Topics  . Smoking status: Current Every Day Smoker    Packs/day: 0.50    Types: Cigarettes  . Smokeless tobacco: Never Used  . Alcohol use Yes     Comment: once a week  . Drug use:     Types: Marijuana     Comment: once a week  . Sexual activity: Yes    Birth control/ protection: None   Other Topics Concern  . None   Social History Narrative  . None    Family History  Problem Relation Age of Onset  . Diabetes Father   . Colon cancer Maternal Aunt   . Breast cancer Maternal Aunt     great  . Heart disease Maternal Aunt   .  Diabetes Maternal Grandmother   . Ovarian cancer Neg Hx      (Not in a hospital admission)  No Known Allergies  Review of Systems Constitutional: No recent fever/chills/sweats Respiratory: No recent cough/bronchitis Cardiovascular: No chest pain Gastrointestinal: No recent nausea/vomiting/diarrhea Genitourinary: No UTI symptoms Hematologic/lymphatic:No history of coagulopathy or recent blood thinner use    Objective:    BP (!) 165/94   Pulse 65   Ht 5\' 3"  (1.6 m)   Wt 198 lb 4.8 oz (89.9 kg)   LMP  (LMP Unknown)   BMI 35.13 kg/m   General:   Normal  Skin:   normal  HEENT:  Normal  Neck:  Supple without Adenopathy or Thyromegaly  Lungs:   Heart:              Breasts:   Abdomen:  Pelvis:  M/S   Extremeties:  Neuro:    clear to auscultation bilaterally   Normal without murmur   Not Examined   soft, non-tender; bowel sounds normal; no masses,  no organomegaly   Exam deferred to OR  No CVAT  Warm/Dry   Normal          Assessment:    1. Abnormal uterine bleeding 2. Fibroid uterus   Plan:   1.  Hysteroscopy/D&C with NovaSure endometrial ablation  Preoperative counseling: Patient is to undergo lab hysteroscopy with D&C and NovaSure endometrial ablation on 07/22/2016. She is understanding of the planned procedure and is aware of and is and is accepting of all surgical risks which include but are not limited to bleeding, infection, pelvic organ injury with need for repair, blood clot disorders, anesthesia risks, etc. All questions have been answered. Consent is given. Patient is ready and willing with surgery as scheduled.  Brayton Mars, MD  Note: This dictation was prepared with Dragon dictation along with smaller phrase technology. Any transcriptional errors that result from this process are unintentional.

## 2016-07-23 NOTE — Op Note (Signed)
OPERATIVE NOTE:  Jill Davidson PROCEDURE DATE: 07/23/2016   PREOPERATIVE DIAGNOSIS:  1. Abnormal uterine bleeding 2. Fibroid uterus 3. Benign endometrial biopsy  POSTOPERATIVE DIAGNOSIS:  1. Abnormal uterine bleeding 2. Fibroid uterus 3. Benign endometrial biopsy  PROCEDURE:  1. Hysteroscopy/D&C with NovaSure endometrial ablation  SURGEON:  Brayton Mars, MD ASSISTANTS: None ANESTHESIA: General INDICATIONS: 45 y.o. [id number]  with long history of abnormal uterine bleeding, known fibroid uterus, (fibroids not submucosal), presents for surgical management of this condition. Medical therapy has not regulated bleeding. IUD trial was unsuccessful.  FINDINGS:  Gynecoid pelvis. Patient is an LAVH candidate if hysterectomy is necessary. Uterus sounded to 8 cm; cervical length 3 cm; cavity width 2.5 cm    I/O's: Total I/O In: -  Out: 70 [Urine:50; Blood:20] COUNTS:  YES SPECIMENS:  endometrial curettings ANTIBIOTIC PROPHYLAXIS:N/A COMPLICATIONS: None immediate  PROCEDURE IN DETAIL:  patient was brought to the operating room and placed in supine position. Gen. anesthesia is induced without difficulty. She was placed in the dorsal lithotomy position using candycane stirrups. A Hibiclens perineal intravaginal prep and drape was performed in standard fashion. Timeout was completed. Red Robinson catheter was used to drain 50 mL of urine from the bladder. Weighted speculum was placed in the vagina. Single-tooth tenaculum was placed on the anterior lip of the cervix. The uterus is sounded to 8 cm. Cervical length appears to be 3 cm during endocervical canal dilation. Hegar dilators up to a #8 [place] caliber are used to dilate the endocervical canal. The ACM[facility] hysteroscope using lactated Ringer's as irrigant fluid is then inserted for intracavitary assessment. The hysteroscopy was discontinued because camera was nonfunctional despite troubleshooting. Sharp curettage was then performed with  both smooth and serrated curettes. This was followed by insertion of the NovaSure endometrial ablation instrument. Cavity assessment was accomplished and successful. The NovaSure instrument was engaged for a minute and 30 seconds with subsequent normal ablation being performed. Attempt at repeat hysteroscopy was unsuccessful, again because of camera malfunction. Procedure was then terminated with all instrumentation being removed from the vagina. Patient was then awakened mobilized and taken to recovery room in satisfactory condition.  Jill Davidson A. Jill Plants, MD, [facility]

## 2016-07-23 NOTE — Discharge Instructions (Signed)

## 2016-07-23 NOTE — Anesthesia Preprocedure Evaluation (Signed)
Anesthesia Evaluation  Patient identified by MRN, date of birth, ID band Patient awake    Reviewed: Allergy & Precautions, NPO status , Patient's Chart, lab work & pertinent test results, reviewed documented beta blocker date and time   Airway Mallampati: III  TM Distance: >3 FB     Dental  (+) Chipped   Pulmonary Current Smoker,           Cardiovascular + dysrhythmias      Neuro/Psych    GI/Hepatic GERD  Controlled,  Endo/Other    Renal/GU      Musculoskeletal   Abdominal   Peds  Hematology  (+) anemia ,   Anesthesia Other Findings Obese.  Reproductive/Obstetrics                             Anesthesia Physical Anesthesia Plan  ASA: III  Anesthesia Plan: General   Post-op Pain Management:    Induction: Intravenous  Airway Management Planned: LMA  Additional Equipment:   Intra-op Plan:   Post-operative Plan:   Informed Consent: I have reviewed the patients History and Physical, chart, labs and discussed the procedure including the risks, benefits and alternatives for the proposed anesthesia with the patient or authorized representative who has indicated his/her understanding and acceptance.     Plan Discussed with: CRNA  Anesthesia Plan Comments:         Anesthesia Quick Evaluation

## 2016-07-23 NOTE — Interval H&P Note (Signed)
History and Physical Interval Note:  07/23/2016 3:09 PM  Jill Davidson  has presented today for surgery, with the diagnosis of MENORRHAGIA, UTERINE LEIOMYOMA  The various methods of treatment have been discussed with the patient and family. After consideration of risks, benefits and other options for treatment, the patient has consented to  Procedure(s): [place] (N/A) as a surgical intervention .  The patient's history has been reviewed, patient examined, no change in status, stable for surgery.  I have reviewed the patient's chart and labs.  Questions were answered to the patient's satisfaction.     Hassell Done A Defrancesco

## 2016-07-23 NOTE — Anesthesia Procedure Notes (Signed)
Procedure Name: LMA Insertion Date/Time: 07/23/2016 4:40 PM Performed by: Silvana Newness Pre-anesthesia Checklist: Patient identified, Emergency Drugs available, Suction available, Patient being monitored and Timeout performed Patient Re-evaluated:Patient Re-evaluated prior to inductionOxygen Delivery Method: Circle system utilized Preoxygenation: Pre-oxygenation with 100% oxygen Intubation Type: IV induction Ventilation: Mask ventilation without difficulty LMA: LMA inserted LMA Size: 4.0 Number of attempts: 1 Placement Confirmation: positive ETCO2 and breath sounds checked- equal and bilateral Tube secured with: Tape Dental Injury: Teeth and Oropharynx as per pre-operative assessment

## 2016-07-23 NOTE — Transfer of Care (Signed)
Immediate Anesthesia Transfer of Care Note  Patient: Jill Davidson  Procedure(s) Performed: Procedure(s): DILATATION & CURETTAGE/HYSTEROSCOPY WITH NOVASURE ABLATION (N/A)  Patient Location: PACU  Anesthesia Type:General  Level of Consciousness: awake and patient cooperative  Airway & Oxygen Therapy: Patient Spontanous Breathing and Patient connected to face mask oxygen  Post-op Assessment: Report given to RN and Post -op Vital signs reviewed and stable  Post vital signs: Reviewed and stable  Last Vitals:  Vitals:   07/23/16 1519 07/23/16 1741  BP: (!) 156/101 148/92  Pulse: 64 80  Resp: 16 16  Temp: 37.3 C (P) 36.3 C    Last Pain:  Vitals:   07/23/16 1519  TempSrc: Oral         Complications: No apparent anesthesia complications

## 2016-07-24 ENCOUNTER — Encounter: Payer: Self-pay | Admitting: Obstetrics and Gynecology

## 2016-07-24 LAB — POCT PREGNANCY, URINE: PREG TEST UR: NEGATIVE

## 2016-07-25 ENCOUNTER — Ambulatory Visit (INDEPENDENT_AMBULATORY_CARE_PROVIDER_SITE_OTHER): Payer: BLUE CROSS/BLUE SHIELD | Admitting: Family Medicine

## 2016-07-25 ENCOUNTER — Encounter: Payer: Self-pay | Admitting: Family Medicine

## 2016-07-25 VITALS — BP 158/80 | HR 78 | Temp 99.6°F | Resp 16 | Wt 200.5 lb

## 2016-07-25 DIAGNOSIS — I10 Essential (primary) hypertension: Secondary | ICD-10-CM | POA: Insufficient documentation

## 2016-07-25 DIAGNOSIS — R809 Proteinuria, unspecified: Secondary | ICD-10-CM | POA: Diagnosis not present

## 2016-07-25 DIAGNOSIS — E669 Obesity, unspecified: Secondary | ICD-10-CM

## 2016-07-25 LAB — POCT UA - MICROALBUMIN: Microalbumin Ur, POC: 50 mg/L

## 2016-07-25 LAB — SURGICAL PATHOLOGY

## 2016-07-25 MED ORDER — VALSARTAN-HYDROCHLOROTHIAZIDE 160-12.5 MG PO TABS: 1.0000 | ORAL_TABLET | Freq: Every day | ORAL | 0 refills | Status: DC

## 2016-07-25 NOTE — Progress Notes (Signed)
Name: Jill Davidson   MRN: [id number]    DOB: [birth year]   Date:07/25/2016       Progress Note  Subjective  Chief Complaint  Chief Complaint  Patient presents with  . Hypertension    pt had a surgery and was not released due to her BP being elevated. Pt stated that she also has headaches and blurry vision at times    HPI  HTN: she has noticed headaches and occasionally dizziness since she started a new job at [employer] ( she does not like it there ), she had endometrial ablation on 11/06 and bp was very high, it has also been elevated at GYN office prior to the procedure. She has a family history of HTN, but no personal history of of pregnancy induced hypertension or eclampsia. She has been gaining weight gradually over the past year. This is her heaviest weight.    Patient Active Problem List   Diagnosis Date Noted  . Obesity (BMI 30.0-34.9) 07/25/2016  . Hypertension 07/25/2016  . Status post endometrial ablation 07/23/2016  . Fibroid, uterine 11/15/2015  . Abnormal uterine bleeding 11/15/2015  . IDA (iron deficiency anemia) 01/25/2015    Past Surgical History:  Procedure Laterality Date  . CESAREAN SECTION     X2  . DILATION AND CURETTAGE OF UTERUS    . DILITATION & CURRETTAGE/HYSTROSCOPY WITH NOVASURE ABLATION N/A 07/23/2016   Procedure: DILATATION & CURETTAGE/HYSTEROSCOPY WITH NOVASURE ABLATION;  Surgeon: Brayton Mars, MD;  Location: [facility] ORS;  Service: Gynecology;  Laterality: N/A;    Family History  Problem Relation Age of Onset  . Diabetes Father   . Hypertension Father   . Colon cancer Maternal Aunt   . Breast cancer Maternal Aunt     great  . Heart disease Maternal Aunt   . Diabetes Maternal Grandmother   . Hypertension Mother   . Hypertension Brother   . Ovarian cancer Neg Hx     Social History   Social History  . Marital status: Single    Spouse name: N/A  . Number of children: N/A  . Years of education: N/A   Occupational History  . Not  on file.   Social History Main Topics  . Smoking status: Current Every Day Smoker    Packs/day: 0.50    Years: 25.00    Types: Cigarettes  . Smokeless tobacco: Never Used  . Alcohol use Yes     Comment: once a week  . Drug use:     Types: Marijuana     Comment: once a week  . Sexual activity: Yes    Birth control/ protection: None   Other Topics Concern  . Not on file   Social History Narrative  . No narrative on file     Current Outpatient Prescriptions:  .  ibuprofen (ADVIL,MOTRIN) 200 MG tablet, Take 600 mg by mouth every 6 (six) hours as needed (for pain.)., Disp: , Rfl:  .  montelukast (SINGULAIR) 10 MG tablet, Take 10 mg by mouth daily as needed (for allergies.). , Disp: , Rfl:  .  oxyCODONE-acetaminophen (ROXICET) 5-325 MG tablet, Take 1-2 tablets by mouth every 4 (four) hours as needed for moderate pain or severe pain., Disp: 30 tablet, Rfl: 0  No Known Allergies   ROS  Constitutional: Negative for fever, positive for  weight change.  Respiratory: Negative for cough and shortness of breath.   Cardiovascular: Negative for chest pain or palpitations.  Gastrointestinal: Negative for abdominal pain, no bowel  changes.  Musculoskeletal: Negative for gait problem or joint swelling.  Skin: Negative for rash.  Neurological: positive for intermittent headache and dizziness.  No other specific complaints in a complete review of systems (except as listed in HPI above).  Objective  Vitals:   07/25/16 1012  BP: (!) 158/80  Pulse: 78  Resp: 16  Temp: 99.6 F (37.6 C)  TempSrc: Oral  SpO2: 97%  Weight: 200 lb 8 oz (90.9 kg)    Body mass index is 35.52 kg/m.  Physical Exam  Constitutional: Patient appears well-developed and well-nourished. Obese  No distress.  HEENT: head atraumatic, normocephalic, pupils equal and reactive to lightneck supple, throat within normal limits Cardiovascular: Normal rate, regular rhythm and normal heart sounds.  No murmur heard. No  BLE edema. Pulmonary/Chest: Effort normal and breath sounds normal. No respiratory distress. Abdominal: Soft.  There is no tenderness. Psychiatric: Patient has a normal mood and affect. behavior is normal. Judgment and thought content normal.  Recent Results (from the past 2160 hour(s))  CBC WITH DIFFERENTIAL     Status: Abnormal   Collection Time: 07/20/16  9:54 AM  Result Value Ref Range   WBC 10.1 3.6 - 11.0 K/uL   RBC 4.33 3.80 - 5.20 MIL/uL   Hemoglobin 11.8 (L) 12.0 - 16.0 g/dL   HCT 34.6 (L) 35.0 - 47.0 %   MCV 79.9 (L) 80.0 - 100.0 fL   MCH 27.2 26.0 - 34.0 pg   MCHC 34.0 32.0 - 36.0 g/dL   RDW 18.3 (H) 11.5 - 14.5 %   Platelets 292 150 - 440 K/uL   Neutrophils Relative % 62 %   Neutro Abs 6.2 1.4 - 6.5 K/uL   Lymphocytes Relative 30 %   Lymphs Abs 3.1 1.0 - 3.6 K/uL   Monocytes Relative 5 %   Monocytes Absolute 0.5 0.2 - 0.9 K/uL   Eosinophils Relative 2 %   Eosinophils Absolute 0.1 0 - 0.7 K/uL   Basophils Relative 1 %   Basophils Absolute 0.1 0 - 0.1 K/uL  Rapid HIV screen (HIV 1/2 Ab+Ag)     Status: None   Collection Time: 07/20/16  9:54 AM  Result Value Ref Range   HIV-1 P24 Antigen - HIV24 NON REACTIVE NON REACTIVE   HIV 1/2 Antibodies NON REACTIVE NON REACTIVE   Interpretation (HIV Ag Ab)      A non reactive test result means that HIV 1 or HIV 2 antibodies and HIV 1 p24 antigen were not detected in the specimen.  RPR     Status: None   Collection Time: 07/20/16  9:54 AM  Result Value Ref Range   RPR Ser Ql Non Reactive Non Reactive    Comment: (NOTE) Performed At: [facility] [address] [place], [place] [id number] Lindon Romp MD [id number]   Type and screen     Status: None   Collection Time: 07/20/16  9:54 AM  Result Value Ref Range   ABO/RH(D) A POS    Antibody Screen NEG    Sample Expiration 08/03/2016    Extend sample reason NO TRANSFUSIONS OR PREGNANCY IN THE PAST 3 MONTHS   Urine Drug Screen, Qualitative ([place] only)      Status: Abnormal   Collection Time: 07/23/16  3:04 PM  Result Value Ref Range   Tricyclic, Ur Screen NONE DETECTED NONE DETECTED   Amphetamines, Ur Screen NONE DETECTED NONE DETECTED   MDMA (Ecstasy)Ur Screen NONE DETECTED NONE DETECTED   Cocaine Metabolite,Ur [place] NONE DETECTED  NONE DETECTED   Opiate, Ur Screen NONE DETECTED NONE DETECTED   Phencyclidine (PCP) Ur S NONE DETECTED NONE DETECTED   Cannabinoid 50 Ng, Ur [place] POSITIVE (A) NONE DETECTED   Barbiturates, Ur Screen NONE DETECTED NONE DETECTED   Benzodiazepine, Ur Scrn NONE DETECTED NONE DETECTED   Methadone Scn, Ur NONE DETECTED NONE DETECTED    Comment: (NOTE) [id number]  Tricyclics, urine               Cutoff 1000 ng/mL 200  Amphetamines, urine             Cutoff 1000 ng/mL 300  MDMA (Ecstasy), urine           Cutoff 500 ng/mL 400  Cocaine Metabolite, urine       Cutoff 300 ng/mL 500  Opiate, urine                   Cutoff 300 ng/mL 600  Phencyclidine (PCP), urine      Cutoff 25 ng/mL 700  Cannabinoid, urine              Cutoff 50 ng/mL 800  Barbiturates, urine             Cutoff 200 ng/mL 900  Benzodiazepine, urine           Cutoff 200 ng/mL 1000 Methadone, urine                Cutoff 300 ng/mL 1100 1200 The urine drug screen provides only a preliminary, unconfirmed 1300 analytical test result and should not be used for non-medical 1400 purposes. Clinical consideration and professional judgment should 1500 be applied to any positive drug screen result due to possible 1600 interfering substances. A more specific alternate chemical method 1700 must be used in order to obtain a confirmed analytical result.  1800 Gas chromato graphy / mass spectrometry (GC/MS) is the preferred 1900 confirmatory method.   Pregnancy, urine POC     Status: None   Collection Time: 07/23/16  3:12 PM  Result Value Ref Range   Preg Test, Ur NEGATIVE NEGATIVE    Comment:        THE SENSITIVITY OF THIS METHODOLOGY IS >24 mIU/mL      PHQ2/9: Depression screen PHQ 2/9 08/15/2015  Decreased Interest 0  Down, Depressed, Hopeless 0  PHQ - 2 Score 0     Fall Risk: Fall Risk  08/15/2015  Falls in the past year? No    Assessment & Plan  1. Essential hypertension  Discussed importance to get bp checked in about one week, since we are starting her on ARB, just in case she has renal artery stenosis, she will be going to gyn and will contact me if bp is not at goal . Otherwise follow up in one month - COMPLETE METABOLIC PANEL WITH GFR - TSH - Lipid panel - EKG 12-Lead - POCT UA - Microalbumin - valsartan-hydrochlorothiazide (DIOVAN-HCT) 160-12.5 MG tablet; Take 1 tablet by mouth daily.  Dispense: 30 tablet; Refill: 0  2. Obesity (BMI 30.0-34.9)  Discussed with the patient the risk posed by an increased BMI. Discussed importance of portion control, calorie counting and at least 150 minutes of physical activity weekly. Avoid sweet beverages and drink more water. Eat at least 6 servings of fruit and vegetables daily . She needs to stop drinking sodas - Lipid panel - Hemoglobin A1c  3. Microalbuminuria  - valsartan-hydrochlorothiazide (DIOVAN-HCT) 160-12.5 MG tablet; Take 1 tablet by mouth daily.  Dispense: 30  tablet; Refill: 0

## 2016-07-25 NOTE — Patient Instructions (Signed)
Hypertension Hypertension, commonly called high blood pressure, is when the force of blood pumping through your arteries is too strong. Your arteries are the blood vessels that carry blood from your heart throughout your body. A blood pressure reading consists of a higher number over a lower number, such as 110/72. The higher number (systolic) is the pressure inside your arteries when your heart pumps. The lower number (diastolic) is the pressure inside your arteries when your heart relaxes. Ideally you want your blood pressure below 120/80. Hypertension forces your heart to work harder to pump blood. Your arteries may become narrow or stiff. Having untreated or uncontrolled hypertension can cause heart attack, stroke, kidney disease, and other problems. RISK FACTORS Some risk factors for high blood pressure are controllable. Others are not.  Risk factors you cannot control include:   Race. You may be at higher risk if you are African American.  Age. Risk increases with age.  Gender. Men are at higher risk than women before age 45 years. After age 65, women are at higher risk than men. Risk factors you can control include:  Not getting enough exercise or physical activity.  Being overweight.  Getting too much fat, sugar, calories, or salt in your diet.  Drinking too much alcohol. SIGNS AND SYMPTOMS Hypertension does not usually cause signs or symptoms. Extremely high blood pressure (hypertensive crisis) may cause headache, anxiety, shortness of breath, and nosebleed. DIAGNOSIS To check if you have hypertension, your health care provider will measure your blood pressure while you are seated, with your arm held at the level of your heart. It should be measured at least twice using the same arm. Certain conditions can cause a difference in blood pressure between your right and left arms. A blood pressure reading that is higher than normal on one occasion does not mean that you need treatment. If  it is not clear whether you have high blood pressure, you may be asked to return on a different day to have your blood pressure checked again. Or, you may be asked to monitor your blood pressure at home for 1 or more weeks. TREATMENT Treating high blood pressure includes making lifestyle changes and possibly taking medicine. Living a healthy lifestyle can help lower high blood pressure. You may need to change some of your habits. Lifestyle changes may include:  Following the DASH diet. This diet is high in fruits, vegetables, and whole grains. It is low in salt, red meat, and added sugars.  Keep your sodium intake below 2,300 mg per day.  Getting at least 30-45 minutes of aerobic exercise at least 4 times per week.  Losing weight if necessary.  Not smoking.  Limiting alcoholic beverages.  Learning ways to reduce stress. Your health care provider may prescribe medicine if lifestyle changes are not enough to get your blood pressure under control, and if one of the following is true:  You are 18-59 years of age and your systolic blood pressure is above 140.  You are 60 years of age or older, and your systolic blood pressure is above 150.  Your diastolic blood pressure is above 90.  You have diabetes, and your systolic blood pressure is over 140 or your diastolic blood pressure is over 90.  You have kidney disease and your blood pressure is above 140/90.  You have heart disease and your blood pressure is above 140/90. Your personal target blood pressure may vary depending on your medical conditions, your age, and other factors. HOME CARE INSTRUCTIONS    Have your blood pressure rechecked as directed by your health care provider.   Take medicines only as directed by your health care provider. Follow the directions carefully. Blood pressure medicines must be taken as prescribed. The medicine does not work as well when you skip doses. Skipping doses also puts you at risk for  problems.  Do not smoke.   Monitor your blood pressure at home as directed by your health care provider. SEEK MEDICAL CARE IF:   You think you are having a reaction to medicines taken.  You have recurrent headaches or feel dizzy.  You have swelling in your ankles.  You have trouble with your vision. SEEK IMMEDIATE MEDICAL CARE IF:  You develop a severe headache or confusion.  You have unusual weakness, numbness, or feel faint.  You have severe chest or abdominal pain.  You vomit repeatedly.  You have trouble breathing. MAKE SURE YOU:   Understand these instructions.  Will watch your condition.  Will get help right away if you are not doing well or get worse.   This information is not intended to replace advice given to you by your health care provider. Make sure you discuss any questions you have with your health care provider.   Document Released: 09/03/2005 Document Revised: 01/18/2015 Document Reviewed: 06/26/2013 Elsevier Interactive Patient Education 2016 Elsevier Inc.  

## 2016-07-26 LAB — LIPID PANEL
CHOL/HDL RATIO: 4.2 ratio (ref <5.0)
CHOLESTEROL: 154 mg/dL (ref <200)
HDL: 37 mg/dL — AB (ref >50)
LDL Cholesterol: 104 mg/dL — ABNORMAL HIGH
TRIGLYCERIDES: 64 mg/dL (ref <150)
VLDL: 13 mg/dL (ref <30)

## 2016-07-26 LAB — COMPLETE METABOLIC PANEL WITH GFR
ALT: 10 U/L (ref 6–29)
AST: 12 U/L (ref 10–30)
Albumin: 4 g/dL (ref 3.6–5.1)
Alkaline Phosphatase: 40 U/L (ref 33–115)
BUN: 10 mg/dL (ref 7–25)
CALCIUM: 9.1 mg/dL (ref 8.6–10.2)
CHLORIDE: 103 mmol/L (ref 98–110)
CO2: 24 mmol/L (ref 20–31)
CREATININE: 0.82 mg/dL (ref 0.50–1.10)
GFR, EST NON AFRICAN AMERICAN: 88 mL/min (ref >=60)
Glucose, Bld: 79 mg/dL (ref 65–99)
POTASSIUM: 4 mmol/L (ref 3.5–5.3)
Sodium: 137 mmol/L (ref 135–146)
Total Bilirubin: 0.5 mg/dL (ref 0.2–1.2)
Total Protein: 6.8 g/dL (ref 6.1–8.1)

## 2016-07-26 LAB — HEMOGLOBIN A1C
Hgb A1c MFr Bld: 5 % (ref <5.7)
MEAN PLASMA GLUCOSE: 97 mg/dL

## 2016-07-26 LAB — TSH: TSH: 1.78 m[IU]/L

## 2016-07-30 ENCOUNTER — Other Ambulatory Visit: Payer: Self-pay | Admitting: Family Medicine

## 2016-07-30 ENCOUNTER — Telehealth: Payer: Self-pay

## 2016-07-30 MED ORDER — AMLODIPINE BESYLATE 5 MG PO TABS: 5.0000 mg | ORAL_TABLET | Freq: Every day | ORAL | 0 refills | Status: DC

## 2016-07-30 NOTE — Telephone Encounter (Signed)
Got a message from call a nurse stating that her BP was high, it was 132/128 and she stated that her heart was racing.  She said that she didn't noticed it until she started the new BP medication. She also noticed that it happens when she is at work.  She has been at home today and has not had any problems so she does not know if it is stress related or not.   She tried to get an appt today but was told that Dr. Ancil Boozer did not have anything.  Please advise.

## 2016-07-30 NOTE — Telephone Encounter (Signed)
I sent a new rx. Norvasc 5 mg to her pharmacy. She can come in this week

## 2016-07-30 NOTE — Telephone Encounter (Signed)
Patient has been scheduled to see Dr. Ancil Boozer tomorrow at 3:20pm. She was told to hold on taking new medication until she sees Dr. Ancil Boozer and she agreed.

## 2016-07-31 ENCOUNTER — Ambulatory Visit (INDEPENDENT_AMBULATORY_CARE_PROVIDER_SITE_OTHER): Payer: BLUE CROSS/BLUE SHIELD | Admitting: Family Medicine

## 2016-07-31 ENCOUNTER — Encounter: Payer: Self-pay | Admitting: Family Medicine

## 2016-07-31 ENCOUNTER — Ambulatory Visit (INDEPENDENT_AMBULATORY_CARE_PROVIDER_SITE_OTHER): Payer: BLUE CROSS/BLUE SHIELD | Admitting: Obstetrics and Gynecology

## 2016-07-31 VITALS — BP 139/93 | HR 93 | Ht 63.0 in | Wt 195.6 lb

## 2016-07-31 VITALS — BP 122/68 | HR 93 | Temp 99.0°F | Resp 16 | Wt 198.0 lb

## 2016-07-31 DIAGNOSIS — Z09 Encounter for follow-up examination after completed treatment for conditions other than malignant neoplasm: Secondary | ICD-10-CM

## 2016-07-31 DIAGNOSIS — R809 Proteinuria, unspecified: Secondary | ICD-10-CM

## 2016-07-31 DIAGNOSIS — F4321 Adjustment disorder with depressed mood: Secondary | ICD-10-CM | POA: Diagnosis not present

## 2016-07-31 DIAGNOSIS — Z9889 Other specified postprocedural states: Secondary | ICD-10-CM

## 2016-07-31 DIAGNOSIS — Z566 Other physical and mental strain related to work: Secondary | ICD-10-CM

## 2016-07-31 DIAGNOSIS — I10 Essential (primary) hypertension: Secondary | ICD-10-CM

## 2016-07-31 DIAGNOSIS — N920 Excessive and frequent menstruation with regular cycle: Secondary | ICD-10-CM

## 2016-07-31 DIAGNOSIS — N939 Abnormal uterine and vaginal bleeding, unspecified: Secondary | ICD-10-CM

## 2016-07-31 MED ORDER — CITALOPRAM HYDROBROMIDE 20 MG PO TABS: 20.0000 mg | ORAL_TABLET | Freq: Every day | ORAL | 3 refills | Status: DC

## 2016-07-31 NOTE — Patient Instructions (Signed)
1. Monitor for any abnormal uterine bleeding 2. Return in 3 months for follow-up 3. Follow-up with Dr. Ancil Boozer regarding blood pressure elevation

## 2016-07-31 NOTE — Progress Notes (Signed)
Chief complaint: 1. Postop check 2. Status post hysteroscopy/D&C with NovaSure endometrial ablation  Patient presents for 1 week status post surgery. She is having minimal cramps. She is taking only ibuprofen for discomfort. Occasional spotting is noted.  Most concerning problem at this time is her elevated blood pressure. She was started on new medication after surgery and is just not quite feeling right. She does have follow-up with her primary care this afternoon-Dr. Ancil Boozer.  Pathology from surgery: DIAGNOSIS:  A. ENDOMETRIUM, DILATATION AND CURETTAGE:  - BLOOD CLOT WITH FRAGMENTS OF BENIGN ENDOMETRIUM AND CERVICAL TISSUE.  - NEGATIVE FOR HYPERPLASIA AND CARCINOMA.    OBJECTIVE: BP (!) 139/93   Pulse 93   Ht 5\' 3"  (1.6 m)   Wt 195 lb 9.6 oz (88.7 kg)   LMP 07/23/2016 (Approximate) Comment: upreg  BMI 34.65 kg/m  Physical exam-deferred  ASSESSMENT: 1. Normal postop check 1 week status post hysteroscopy/D&C with NovaSure endometrial ablation 2. Elevated blood pressure, on new medication regimen, with some side effects; primary care appointment later today  PLAN: 1. Monitor for any abnormal uterine bleeding over the next 3 months 2. Return in 3 months for follow-up 3. Follow-up with Dr. Ancil Boozer regarding blood pressure management  Brayton Mars, MD  Note: This dictation was prepared with Dragon dictation along with smaller phrase technology. Any transcriptional errors that result from this process are unintentional.

## 2016-07-31 NOTE — Progress Notes (Signed)
Name: Jill Davidson   MRN: [id number]    DOB: 1970-12-26   Date:07/31/2016       Progress Note  Subjective  Chief Complaint  Chief Complaint  Patient presents with  . Hypertension    pt stated that the medication that she was put on last visit has had her heart racing and her BP still elevated. Pt has not taken it today  . Fatigue    HPI  HTN: she has noticed headaches and occasionally dizziness since she started a new job at [employer] ( she does not like it there ), she had endometrial ablation on 11/06 and bp was very high, it has also been elevated at GYN office prior to the procedure. She has a family history of HTN, but no personal history of of pregnancy induced hypertension or eclampsia. BP is only going up at work over the past week. She states she was initially doing well on Valsartan /hctz but last Saturday her bp while at work went up to 138/123 and had tachycardia. She states that the same thing happened yesterday with palpitation and dizziness. She was advised to stop medication and return for follow up. BP today is at goal. She has proteinuria and advised referral to nephrologist since bp is all over the place. It may be secondary to stress, but it could also be secondary hypertension.   Depression: she has crying spells, lack of energy and motivation, having headaches, she feels down all the time. Decrease in appetite, difficulty focusing, difficulty sleeping.   Patient Active Problem List   Diagnosis Date Noted  . Obesity (BMI 30.0-34.9) 07/25/2016  . Hypertension 07/25/2016  . Status post endometrial ablation 07/23/2016  . Fibroid, uterine 11/15/2015  . Abnormal uterine bleeding 11/15/2015  . IDA (iron deficiency anemia) 01/25/2015    Past Surgical History:  Procedure Laterality Date  . CESAREAN SECTION     X2  . DILATION AND CURETTAGE OF UTERUS    . DILITATION & CURRETTAGE/HYSTROSCOPY WITH NOVASURE ABLATION N/A 07/23/2016   Procedure: DILATATION &  CURETTAGE/HYSTEROSCOPY WITH NOVASURE ABLATION;  Surgeon: Brayton Mars, MD;  Location: [facility] ORS;  Service: Gynecology;  Laterality: N/A;    Family History  Problem Relation Age of Onset  . Diabetes Father   . Hypertension Father   . Colon cancer Maternal Aunt   . Breast cancer Maternal Aunt     great  . Heart disease Maternal Aunt   . Diabetes Maternal Grandmother   . Hypertension Mother   . Hypertension Brother   . Ovarian cancer Neg Hx     Social History   Social History  . Marital status: Single    Spouse name: N/A  . Number of children: N/A  . Years of education: N/A   Occupational History  . Not on file.   Social History Main Topics  . Smoking status: Current Every Day Smoker    Packs/day: 0.50    Years: 25.00    Types: Cigarettes  . Smokeless tobacco: Never Used  . Alcohol use Yes     Comment: once a week  . Drug use:     Types: Marijuana     Comment: once a week  . Sexual activity: Yes    Birth control/ protection: None   Other Topics Concern  . Not on file   Social History Narrative   She is a Emergency planning/management officer, currently at [employer]     Current Outpatient Prescriptions:  .  citalopram ([place]) 20  MG tablet, Take 1 tablet (20 mg total) by mouth daily., Disp: 30 tablet, Rfl: 3 .  ibuprofen (ADVIL,MOTRIN) 200 MG tablet, Take 600 mg by mouth every 6 (six) hours as needed (for pain.)., Disp: , Rfl:  .  montelukast (SINGULAIR) 10 MG tablet, Take 10 mg by mouth daily as needed (for allergies.). , Disp: , Rfl:   No Known Allergies   ROS  Ten systems reviewed and is negative except as mentioned in HPI  Objective  Vitals:   07/31/16 1525  BP: 122/68  Pulse: 93  Resp: 16  Temp: 99 F (37.2 C)  SpO2: 98%  Weight: 198 lb (89.8 kg)    Body mass index is 35.07 kg/m.  Physical Exam  Constitutional: Patient appears well-developed and well-nourished. Obese No distress.  HEENT: head atraumatic, normocephalic, pupils equal and reactive to  light,  neck supple, throat within normal limits Cardiovascular: Normal rate, regular rhythm and normal heart sounds.  No murmur heard. No BLE edema. Pulmonary/Chest: Effort normal and breath sounds normal. No respiratory distress. Abdominal: Soft.  There is no tenderness. Psychiatric: Patient has a depressed  mood and affect. behavior is normal. Judgment and thought content normal.  Recent Results (from the past 2160 hour(s))  CBC WITH DIFFERENTIAL     Status: Abnormal   Collection Time: 07/20/16  9:54 AM  Result Value Ref Range   WBC 10.1 3.6 - 11.0 K/uL   RBC 4.33 3.80 - 5.20 MIL/uL   Hemoglobin 11.8 (L) 12.0 - 16.0 g/dL   HCT 34.6 (L) 35.0 - 47.0 %   MCV 79.9 (L) 80.0 - 100.0 fL   MCH 27.2 26.0 - 34.0 pg   MCHC 34.0 32.0 - 36.0 g/dL   RDW 18.3 (H) 11.5 - 14.5 %   Platelets 292 150 - 440 K/uL   Neutrophils Relative % 62 %   Neutro Abs 6.2 1.4 - 6.5 K/uL   Lymphocytes Relative 30 %   Lymphs Abs 3.1 1.0 - 3.6 K/uL   Monocytes Relative 5 %   Monocytes Absolute 0.5 0.2 - 0.9 K/uL   Eosinophils Relative 2 %   Eosinophils Absolute 0.1 0 - 0.7 K/uL   Basophils Relative 1 %   Basophils Absolute 0.1 0 - 0.1 K/uL  Rapid HIV screen (HIV 1/2 Ab+Ag)     Status: None   Collection Time: 07/20/16  9:54 AM  Result Value Ref Range   HIV-1 P24 Antigen - HIV24 NON REACTIVE NON REACTIVE   HIV 1/2 Antibodies NON REACTIVE NON REACTIVE   Interpretation (HIV Ag Ab)      A non reactive test result means that HIV 1 or HIV 2 antibodies and HIV 1 p24 antigen were not detected in the specimen.  RPR     Status: None   Collection Time: 07/20/16  9:54 AM  Result Value Ref Range   RPR Ser Ql Non Reactive Non Reactive    Comment: (NOTE) Performed At: [facility] [address] [place], [place] [id number] Lindon Romp MD [id number]   Type and screen     Status: None   Collection Time: 07/20/16  9:54 AM  Result Value Ref Range   ABO/RH(D) A POS    Antibody Screen NEG    Sample  Expiration 08/03/2016    Extend sample reason NO TRANSFUSIONS OR PREGNANCY IN THE PAST 3 MONTHS   Urine Drug Screen, Qualitative ([place] only)     Status: Abnormal   Collection Time: 07/23/16  3:04 PM  Result  Value Ref Range   Tricyclic, Ur Screen NONE DETECTED NONE DETECTED   Amphetamines, Ur Screen NONE DETECTED NONE DETECTED   MDMA (Ecstasy)Ur Screen NONE DETECTED NONE DETECTED   Cocaine Metabolite,Ur [place] NONE DETECTED NONE DETECTED   Opiate, Ur Screen NONE DETECTED NONE DETECTED   Phencyclidine (PCP) Ur S NONE DETECTED NONE DETECTED   Cannabinoid 50 Ng, Ur [place] POSITIVE (A) NONE DETECTED   Barbiturates, Ur Screen NONE DETECTED NONE DETECTED   Benzodiazepine, Ur Scrn NONE DETECTED NONE DETECTED   Methadone Scn, Ur NONE DETECTED NONE DETECTED    Comment: (NOTE) [id number]  Tricyclics, urine               Cutoff 1000 ng/mL 200  Amphetamines, urine             Cutoff 1000 ng/mL 300  MDMA (Ecstasy), urine           Cutoff 500 ng/mL 400  Cocaine Metabolite, urine       Cutoff 300 ng/mL 500  Opiate, urine                   Cutoff 300 ng/mL 600  Phencyclidine (PCP), urine      Cutoff 25 ng/mL 700  Cannabinoid, urine              Cutoff 50 ng/mL 800  Barbiturates, urine             Cutoff 200 ng/mL 900  Benzodiazepine, urine           Cutoff 200 ng/mL 1000 Methadone, urine                Cutoff 300 ng/mL 1100 1200 The urine drug screen provides only a preliminary, unconfirmed 1300 analytical test result and should not be used for non-medical 1400 purposes. Clinical consideration and professional judgment should 1500 be applied to any positive drug screen result due to possible 1600 interfering substances. A more specific alternate chemical method 1700 must be used in order to obtain a confirmed analytical result.  1800 Gas chromato graphy / mass spectrometry (GC/MS) is the preferred 1900 confirmatory method.   Pregnancy, urine POC     Status: None   Collection Time: 07/23/16  3:12 PM  Result  Value Ref Range   Preg Test, Ur NEGATIVE NEGATIVE    Comment:        THE SENSITIVITY OF THIS METHODOLOGY IS >24 mIU/mL   Surgical pathology     Status: None   Collection Time: 07/23/16  5:09 PM  Result Value Ref Range   SURGICAL PATHOLOGY      Surgical Pathology CASE: [id number] PATIENT: Arletha Grippe Surgical Pathology Report     SPECIMEN SUBMITTED: A. Endometrial curettings  CLINICAL HISTORY: None provided  PRE-OPERATIVE DIAGNOSIS: Menorrhagia, uterine leiomyoma  POST-OPERATIVE DIAGNOSIS: Same as pre-op     DIAGNOSIS: A. ENDOMETRIUM, DILATATION AND CURETTAGE: - BLOOD CLOT WITH FRAGMENTS OF BENIGN ENDOMETRIUM AND CERVICAL TISSUE. - NEGATIVE FOR HYPERPLASIA AND CARCINOMA.   GROSS DESCRIPTION:  A. Labeled: endometrial curetting  Tissue fragment(s): multiple  Size: aggregate, 3.5 x 2.5 x 0.3 cm  Description: blood clot material  Entirely submitted in 1-2 cassette(s).    Final Diagnosis performed by Delorse Lek, MD.  Electronically signed 07/25/2016 12:43:36PM    The electronic signature indicates that the named Attending Pathologist has evaluated the specimen  Technical component performed at [facility], [address], [place], [place] [address] Lab: [phone] Dir: Darrick Penna. Gunnar Fusi, MD  Professional component performed  at [facility], [facility], [place], [place], [place] [address] Lab: [phone] Dir: Dellia Nims. Rubinas, MD    COMPLETE METABOLIC PANEL WITH GFR     Status: None   Collection Time: 07/25/16  8:18 AM  Result Value Ref Range   Sodium 137 135 - 146 mmol/L   Potassium 4.0 3.5 - 5.3 mmol/L   Chloride 103 98 - 110 mmol/L   CO2 24 20 - 31 mmol/L   Glucose, Bld 79 65 - 99 mg/dL   BUN 10 7 - 25 mg/dL   Creat 0.82 0.50 - 1.10 mg/dL   Total Bilirubin 0.5 0.2 - 1.2 mg/dL   Alkaline Phosphatase 40 33 - 115 U/L   AST 12 10 - 30 U/L   ALT 10 6 - 29 U/L   Total Protein 6.8 6.1 - 8.1 g/dL   Albumin 4.0 3.6 - 5.1  g/dL   Calcium 9.1 8.6 - 10.2 mg/dL   GFR, Est African American >89 >=60 mL/min   GFR, Est Non African American 88 >=60 mL/min  TSH     Status: None   Collection Time: 07/25/16  8:18 AM  Result Value Ref Range   TSH 1.78 mIU/L    Comment:   Reference Range   > or = 20 Years  0.40-4.50   Pregnancy Range First trimester  0.26-2.66 Second trimester 0.55-2.73 Third trimester  0.43-2.91     Lipid panel     Status: Abnormal   Collection Time: 07/25/16  8:18 AM  Result Value Ref Range   Cholesterol 154 <200 mg/dL    Comment: ** Please note change in reference range(s). **      Triglycerides 64 <150 mg/dL    Comment: ** Please note change in reference range(s). **      HDL 37 (L) >50 mg/dL    Comment: ** Please note change in reference range(s). **      Total CHOL/HDL Ratio 4.2 <5.0 Ratio   VLDL 13 <30 mg/dL   LDL Cholesterol 104 (H) mg/dL    Comment: ** Please note change in reference range(s). **     Hemoglobin A1c     Status: None   Collection Time: 07/25/16  8:18 AM  Result Value Ref Range   Hgb A1c MFr Bld 5.0 <5.7 %    Comment:   For the purpose of screening for the presence of diabetes:   <5.7%       Consistent with the absence of diabetes 5.7-6.4 %   Consistent with increased risk for diabetes (prediabetes) >=6.5 %     Consistent with diabetes   This assay result is consistent with a decreased risk of diabetes.   Currently, no consensus exists regarding use of hemoglobin A1c for diagnosis of diabetes in children.   According to American Diabetes Association (ADA) guidelines, hemoglobin A1c <7.0% represents optimal control in non-pregnant diabetic patients. Different metrics may apply to specific patient populations. Standards of Medical Care in Diabetes (ADA).      Mean Plasma Glucose 97 mg/dL  POCT UA - Microalbumin     Status: Abnormal   Collection Time: 07/25/16 11:05 AM  Result Value Ref Range   Microalbumin Ur, POC 50 mg/L   Creatinine, POC   mg/dL   Albumin/Creatinine Ratio, Urine, POC       PHQ2/9: Depression screen [facility] 2/9 08/15/2015  Decreased Interest 0  Down, Depressed, Hopeless 0  PHQ - 2 Score 0    Fall Risk: Fall Risk  08/15/2015  Falls in the past year? No     Assessment & Plan  1. Hypertension, unspecified type  - citalopram (CELEXA) 20 MG tablet; Take 1 tablet (20 mg total) by mouth daily.  Dispense: 30 tablet; Refill: 3 -nephrologist   2. Microalbuminuria  - Ambulatory referral to Nephrology  3. Stress at work  - citalopram (CELEXA) 20 MG tablet; Take 1 tablet (20 mg total) by mouth daily.  Dispense: 30 tablet; Refill: 3  4. Situational depression  - citalopram (CELEXA) 20 MG tablet; Take 1 tablet (20 mg total) by mouth daily.  Dispense: 30 tablet; Refill: 3

## 2016-08-15 ENCOUNTER — Telehealth: Payer: Self-pay | Admitting: Obstetrics and Gynecology

## 2016-08-15 MED ORDER — IBUPROFEN 800 MG PO TABS: 800.0000 mg | ORAL_TABLET | Freq: Three times a day (TID) | ORAL | 1 refills | Status: DC | PRN

## 2016-08-15 NOTE — Telephone Encounter (Signed)
HAD SURGERY ON 11/14. SHE IS STILL HAVING A LOT OF CRAMPING. SHE WANTS TO KNOW IF SHE CAN HAVE SOME 800MG  IBUPROPHEN. ([facility] [address] [place])

## 2016-08-15 NOTE — Telephone Encounter (Signed)
Pt aware med erx. 

## 2016-08-22 ENCOUNTER — Ambulatory Visit: Payer: BLUE CROSS/BLUE SHIELD | Admitting: Family Medicine

## 2016-08-27 ENCOUNTER — Other Ambulatory Visit: Payer: Self-pay | Admitting: Family Medicine

## 2016-08-27 NOTE — Telephone Encounter (Signed)
Patient requesting refill of Amlodipine to [facility].  

## 2016-09-13 ENCOUNTER — Telehealth: Payer: Self-pay | Admitting: Obstetrics and Gynecology

## 2016-09-13 NOTE — Telephone Encounter (Signed)
Pt called and is just requesting a call back from you, that's all she said.

## 2016-09-13 NOTE — Telephone Encounter (Signed)
Pt is s/p ablation 07/23/2016. She had a cycle in 07/2016 that was normal. LMP 09/08/2016- changing q 4 hours. Pos cramps relieved with ibup. Advised she may take tylenol to help with cramps. Pt aware if cycle last longer than 7 days or soaking a pad q 30 minutes to an hour she will need to be seen sooner than 10/2016 appt. Will monitor 09/2016 cycle and call me back.

## 2016-09-28 DIAGNOSIS — R319 Hematuria, unspecified: Secondary | ICD-10-CM | POA: Diagnosis not present

## 2016-09-28 DIAGNOSIS — D649 Anemia, unspecified: Secondary | ICD-10-CM | POA: Diagnosis not present

## 2016-09-28 DIAGNOSIS — I10 Essential (primary) hypertension: Secondary | ICD-10-CM | POA: Diagnosis not present

## 2016-09-28 DIAGNOSIS — R809 Proteinuria, unspecified: Secondary | ICD-10-CM | POA: Diagnosis not present

## 2016-10-31 ENCOUNTER — Encounter: Payer: BLUE CROSS/BLUE SHIELD | Admitting: Obstetrics and Gynecology

## 2016-12-22 ENCOUNTER — Other Ambulatory Visit: Payer: Self-pay | Admitting: Obstetrics and Gynecology

## 2017-03-12 DIAGNOSIS — I10 Essential (primary) hypertension: Secondary | ICD-10-CM | POA: Diagnosis not present

## 2017-03-12 DIAGNOSIS — E559 Vitamin D deficiency, unspecified: Secondary | ICD-10-CM | POA: Diagnosis not present

## 2017-06-03 ENCOUNTER — Encounter: Payer: Self-pay | Admitting: Family Medicine

## 2017-06-03 ENCOUNTER — Ambulatory Visit (INDEPENDENT_AMBULATORY_CARE_PROVIDER_SITE_OTHER): Payer: BLUE CROSS/BLUE SHIELD | Admitting: Family Medicine

## 2017-06-03 VITALS — BP 126/84 | HR 72 | Temp 98.2°F | Resp 16 | Ht 63.0 in | Wt 205.0 lb

## 2017-06-03 DIAGNOSIS — Z131 Encounter for screening for diabetes mellitus: Secondary | ICD-10-CM | POA: Diagnosis not present

## 2017-06-03 DIAGNOSIS — I10 Essential (primary) hypertension: Secondary | ICD-10-CM | POA: Diagnosis not present

## 2017-06-03 DIAGNOSIS — J302 Other seasonal allergic rhinitis: Secondary | ICD-10-CM | POA: Diagnosis not present

## 2017-06-03 DIAGNOSIS — E669 Obesity, unspecified: Secondary | ICD-10-CM

## 2017-06-03 DIAGNOSIS — Z13220 Encounter for screening for lipoid disorders: Secondary | ICD-10-CM

## 2017-06-03 DIAGNOSIS — J40 Bronchitis, not specified as acute or chronic: Secondary | ICD-10-CM | POA: Diagnosis not present

## 2017-06-03 DIAGNOSIS — Z862 Personal history of diseases of the blood and blood-forming organs and certain disorders involving the immune mechanism: Secondary | ICD-10-CM | POA: Diagnosis not present

## 2017-06-03 DIAGNOSIS — R809 Proteinuria, unspecified: Secondary | ICD-10-CM | POA: Diagnosis not present

## 2017-06-03 DIAGNOSIS — E66811 Obesity, class 1: Secondary | ICD-10-CM

## 2017-06-03 DIAGNOSIS — E559 Vitamin D deficiency, unspecified: Secondary | ICD-10-CM

## 2017-06-03 DIAGNOSIS — D509 Iron deficiency anemia, unspecified: Secondary | ICD-10-CM | POA: Diagnosis not present

## 2017-06-03 MED ORDER — MONTELUKAST SODIUM 10 MG PO TABS: 10.0000 mg | ORAL_TABLET | Freq: Every day | ORAL | 1 refills | Status: DC | PRN

## 2017-06-03 MED ORDER — FLUTICASONE FUROATE-VILANTEROL 100-25 MCG/INH IN AEPB: 1.0000 | INHALATION_SPRAY | Freq: Every day | RESPIRATORY_TRACT | 0 refills | Status: DC

## 2017-06-03 MED ORDER — FLUTICASONE PROPIONATE 50 MCG/ACT NA SUSP: 2.0000 | Freq: Every day | NASAL | 6 refills | Status: DC

## 2017-06-03 NOTE — Patient Instructions (Signed)
Steps to Quit Smoking Smoking tobacco can be bad for your health. It can also affect almost every organ in your body. Smoking puts you and people around you at risk for many serious long-lasting (chronic) diseases. Quitting smoking is hard, but it is one of the best things that you can do for your health. It is never too late to quit. What are the benefits of quitting smoking? When you quit smoking, you lower your risk for getting serious diseases and conditions. They can include:  Lung cancer or lung disease.  Heart disease.  Stroke.  Heart attack.  Not being able to have children (infertility).  Weak bones (osteoporosis) and broken bones (fractures).  If you have coughing, wheezing, and shortness of breath, those symptoms may get better when you quit. You may also get sick less often. If you are pregnant, quitting smoking can help to lower your chances of having a baby of low birth weight. What can I do to help me quit smoking? Talk with your doctor about what can help you quit smoking. Some things you can do (strategies) include:  Quitting smoking totally, instead of slowly cutting back how much you smoke over a period of time.  Going to in-person counseling. You are more likely to quit if you go to many counseling sessions.  Using resources and support systems, such as: ? Online chats with a counselor. ? Phone quitlines. ? Printed self-help materials. ? Support groups or group counseling. ? Text messaging programs. ? Mobile phone apps or applications.  Taking medicines. Some of these medicines may have nicotine in them. If you are pregnant or breastfeeding, do not take any medicines to quit smoking unless your doctor says it is okay. Talk with your doctor about counseling or other things that can help you.  Talk with your doctor about using more than one strategy at the same time, such as taking medicines while you are also going to in-person counseling. This can help make  quitting easier. What things can I do to make it easier to quit? Quitting smoking might feel very hard at first, but there is a lot that you can do to make it easier. Take these steps:  Talk to your family and friends. Ask them to support and encourage you.  Call phone quitlines, reach out to support groups, or work with a counselor.  Ask people who smoke to not smoke around you.  Avoid places that make you want (trigger) to smoke, such as: ? Bars. ? Parties. ? Smoke-break areas at work.  Spend time with people who do not smoke.  Lower the stress in your life. Stress can make you want to smoke. Try these things to help your stress: ? Getting regular exercise. ? Deep-breathing exercises. ? Yoga. ? Meditating. ? Doing a body scan. To do this, close your eyes, focus on one area of your body at a time from head to toe, and notice which parts of your body are tense. Try to relax the muscles in those areas.  Download or buy apps on your mobile phone or tablet that can help you stick to your quit plan. There are many free apps, such as QuitGuide from the CDC (Centers for Disease Control and Prevention). You can find more support from smokefree.gov and other websites.  This information is not intended to replace advice given to you by your health care provider. Make sure you discuss any questions you have with your health care provider. Document Released: 06/30/2009 Document   Revised: 05/01/2016 Document Reviewed: 01/18/2015 Elsevier Interactive Patient Education  2018 Elsevier Inc.  

## 2017-06-03 NOTE — Addendum Note (Signed)
Addended by: Lolita Rieger D on: 06/03/2017 11:52 AM   Modules accepted: Orders

## 2017-06-03 NOTE — Progress Notes (Signed)
Name: Jill Davidson   MRN: [id number]    DOB: 1970/11/27   Date:06/03/2017       Progress Note  Subjective  Chief Complaint  Chief Complaint  Patient presents with  . Bronchitis    Onset-1 week, Chest congestion, sinus headaches for 3 days, starting to get a sore throat, fatigue, sweating. Has tried otc allergy medication but has not helped at all.    HPI  Bronchitis: she still smokes and states she his having recurrent symptoms of cols. She states this time around it started 3 days ago with frontal headache, followed by ear itching, nose draining, eye watering, and a dry cough with occasional SOB with activity, she also has some wheezing, worse at night. No fever but has some chills.   History of iron deficiency anemia: secondary to heavy cycles , but doing well since endometrial ablation, history of iron infusion in the past, no recent labs, we will recheck today. She has noticed pica again, craving laundry starch.   HTN: seeing nephrologist for microalbuminuria, taking ARB and bp is at goal, has regular follow up - every 6 months. No chest pain or palpitation.  AR: she has stopped taking all medication, we will resume otc loratadine, nasal spray and singulair, return for spirometry  Patient Active Problem List   Diagnosis Date Noted  . Obesity (BMI 30.0-34.9) 07/25/2016  . Hypertension 07/25/2016  . Status post endometrial ablation 07/23/2016  . Fibroid, uterine 11/15/2015  . Abnormal uterine bleeding 11/15/2015  . IDA (iron deficiency anemia) 01/25/2015    Past Surgical History:  Procedure Laterality Date  . CESAREAN SECTION     X2  . DILATION AND CURETTAGE OF UTERUS    . DILITATION & CURRETTAGE/HYSTROSCOPY WITH NOVASURE ABLATION N/A 07/23/2016   Procedure: DILATATION & CURETTAGE/HYSTEROSCOPY WITH NOVASURE ABLATION;  Surgeon: Brayton Mars, MD;  Location: [facility] ORS;  Service: Gynecology;  Laterality: N/A;    Family History  Problem Relation Age of Onset  . Diabetes  Father   . Hypertension Father   . Colon cancer Maternal Aunt   . Breast cancer Maternal Aunt        great  . Heart disease Maternal Aunt   . Diabetes Maternal Grandmother   . Hypertension Mother   . Hypertension Brother   . Ovarian cancer Neg Hx     Social History   Social History  . Marital status: Single    Spouse name: N/A  . Number of children: N/A  . Years of education: N/A   Occupational History  . Not on file.   Social History Main Topics  . Smoking status: Current Every Day Smoker    Packs/day: 0.25    Years: 25.00    Types: Cigarettes  . Smokeless tobacco: Never Used  . Alcohol use Yes     Comment: once a week  . Drug use: Yes    Types: Marijuana     Comment: once a week  . Sexual activity: Yes    Birth control/ protection: None   Other Topics Concern  . 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  acetaminophen (TYLENOL) 325 MG tablet, Take 650 mg by mouth every 6 (six) hours as needed., Disp: , Rfl:  .  losartan ([place]) 25 MG tablet, Take 25  mg by mouth daily., Disp: , Rfl: 3 .  montelukast (SINGULAIR) 10 MG tablet, Take 1 tablet (10 mg total) by mouth daily as needed (for allergies.)., Disp: 90 tablet, Rfl: 1  No Known Allergies   ROS  Constitutional: Negative for fever, positive for  weight change.  Respiratory: Positive  for cough, positive for intermittent shortness of breath with activity ( needs to return for spirometry ) .   Cardiovascular: Negative for chest pain or palpitations.  Gastrointestinal: Negative for abdominal pain, no bowel changes.  Musculoskeletal: Negative for gait problem or joint swelling.  Skin: Negative for rash.   Neurological: Negative for dizziness , positive for headache past few days.  No other specific complaints in a complete review of systems  (except as listed in HPI above).  Objective  Vitals:   06/03/17 1103  BP: 126/84  Pulse: 72  Resp: 16  Temp: 98.2 F (36.8 C)  TempSrc: Oral  SpO2: 99%  Weight: 205 lb (93 kg)  Height: 5\' 3"  (1.6 m)    Body mass index is 36.31 kg/m.  Physical Exam  Constitutional: Patient appears well-developed and well-nourished. Obese  No distress.  HEENT: head atraumatic, normocephalic, pupils equal and reactive to light, ears normal TM bilaterally,  neck supple, throat within normal limits Cardiovascular: Normal rate, regular rhythm and normal heart sounds.  No murmur heard. No BLE edema. Pulmonary/Chest: Effort normal and breath sounds normal. No respiratory distress. Abdominal: Soft.  There is no tenderness. Psychiatric: Patient has a normal mood and affect. behavior is normal. Judgment and thought content normal.   PHQ2/9: Depression screen [facility] 2/9 06/03/2017 08/15/2015  Decreased Interest 0 0  Down, Depressed, Hopeless 0 0  PHQ - 2 Score 0 0     Fall Risk: Fall Risk  06/03/2017 08/15/2015  Falls in the past year? No No     Functional Status Survey: Is the patient deaf or have difficulty hearing?: No Does the patient have difficulty seeing, even when wearing glasses/contacts?: No Does the patient have difficulty concentrating, remembering, or making decisions?: No Does the patient have difficulty walking or climbing stairs?: No Does the patient have difficulty dressing or bathing?: No Does the patient have difficulty doing errands alone such as visiting a doctor's office or shopping?: No    Assessment & Plan  1. Bronchitis  Explained importance of quitting smoking, gave samples of Mucines, we will add allergy medication and also a breo sample today, no antibiotics given   2. Hypertension, unspecified type  bpp is at goal  - COMPLETE METABOLIC PANEL WITH GFR  3. Microalbuminuria  Continue follow up with nephrologist  4. Obesity (BMI 30.0-34.9)  - Insulin,  fasting  5. History of iron deficiency anemia  - CBC with Differential/Platelet  6. Diabetes mellitus screening  - Hemoglobin A1c  7. Lipid screening  - Lipid panel  8. Vitamin D deficiency  - VITAMIN D 25 Hydroxy (Vit-D Deficiency, Fractures)  9. Acute seasonal allergic rhinitis  - montelukast (SINGULAIR) 10 MG tablet; Take 1 tablet (10 mg total) by mouth daily as needed (for allergies.).  Dispense: 90 tablet; Refill: 1

## 2017-06-04 ENCOUNTER — Other Ambulatory Visit: Payer: Self-pay

## 2017-06-04 DIAGNOSIS — D509 Iron deficiency anemia, unspecified: Secondary | ICD-10-CM

## 2017-06-04 LAB — LIPID PANEL
Cholesterol: 146 mg/dL (ref <200)
HDL: 44 mg/dL — ABNORMAL LOW (ref >50)
LDL Cholesterol (Calc): 86 mg/dL (calc)
Non-HDL Cholesterol (Calc): 102 mg/dL (calc) (ref <130)
Total CHOL/HDL Ratio: 3.3 (calc) (ref <5.0)
Triglycerides: 71 mg/dL (ref <150)

## 2017-06-04 LAB — CBC WITH DIFFERENTIAL/PLATELET
Basophils Absolute: 50 cells/uL (ref 0–200)
Basophils Relative: 0.5 %
EOS ABS: 90 {cells}/uL (ref 15–500)
EOS PCT: 0.9 %
HCT: 35.7 % (ref 35.0–45.0)
HEMOGLOBIN: 11.4 g/dL — AB (ref 11.7–15.5)
Lymphs Abs: 3870 cells/uL (ref 850–3900)
MCH: 26.6 pg — AB (ref 27.0–33.0)
MCHC: 31.9 g/dL — AB (ref 32.0–36.0)
MCV: 83.4 fL (ref 80.0–100.0)
MONOS PCT: 4.3 %
MPV: 10 fL (ref 7.5–12.5)
NEUTROS ABS: 5560 {cells}/uL (ref 1500–7800)
Neutrophils Relative %: 55.6 %
PLATELETS: 406 10*3/uL — AB (ref 140–400)
RBC: 4.28 10*6/uL (ref 3.80–5.10)
RDW: 15.2 % — AB (ref 11.0–15.0)
TOTAL LYMPHOCYTE: 38.7 %
WBC mixed population: 430 cells/uL (ref 200–950)
WBC: 10 10*3/uL (ref 3.8–10.8)

## 2017-06-04 LAB — COMPLETE METABOLIC PANEL WITH GFR
AG RATIO: 1.2 (calc) (ref 1.0–2.5)
ALT: 11 U/L (ref 6–29)
AST: 17 U/L (ref 10–30)
Albumin: 3.7 g/dL (ref 3.6–5.1)
Alkaline phosphatase (APISO): 46 U/L (ref 33–115)
BUN: 12 mg/dL (ref 7–25)
CALCIUM: 9 mg/dL (ref 8.6–10.2)
CHLORIDE: 106 mmol/L (ref 98–110)
CO2: 27 mmol/L (ref 20–32)
Creat: 0.68 mg/dL (ref 0.50–1.10)
GFR, EST AFRICAN AMERICAN: 123 mL/min/{1.73_m2} (ref >=60)
GFR, EST NON AFRICAN AMERICAN: 106 mL/min/{1.73_m2} (ref >=60)
GLUCOSE: 91 mg/dL (ref 65–99)
Globulin: 3.1 g/dL (calc) (ref 1.9–3.7)
POTASSIUM: 3.9 mmol/L (ref 3.5–5.3)
Sodium: 139 mmol/L (ref 135–146)
TOTAL PROTEIN: 6.8 g/dL (ref 6.1–8.1)
Total Bilirubin: 0.4 mg/dL (ref 0.2–1.2)

## 2017-06-04 LAB — HEMOGLOBIN A1C
Hgb A1c MFr Bld: 5.2 % of total Hgb (ref <5.7)
Mean Plasma Glucose: 103 (calc)
eAG (mmol/L): 5.7 (calc)

## 2017-06-04 LAB — TEST AUTHORIZATION

## 2017-06-04 LAB — INSULIN, FASTING: INSULIN: 4.3 u[IU]/mL (ref 2.0–19.6)

## 2017-06-04 LAB — VITAMIN D 25 HYDROXY (VIT D DEFICIENCY, FRACTURES): Vit D, 25-Hydroxy: 26 ng/mL — ABNORMAL LOW (ref 30–100)

## 2017-06-04 LAB — IRON: Iron: 21 ug/dL — ABNORMAL LOW (ref 40–190)

## 2017-06-06 ENCOUNTER — Other Ambulatory Visit: Payer: Self-pay

## 2017-06-06 DIAGNOSIS — D509 Iron deficiency anemia, unspecified: Secondary | ICD-10-CM

## 2017-06-21 ENCOUNTER — Ambulatory Visit: Payer: BLUE CROSS/BLUE SHIELD | Admitting: Family Medicine

## 2017-06-22 DIAGNOSIS — K0889 Other specified disorders of teeth and supporting structures: Secondary | ICD-10-CM | POA: Diagnosis not present

## 2017-06-25 ENCOUNTER — Encounter: Payer: Self-pay | Admitting: Family Medicine

## 2017-06-25 ENCOUNTER — Ambulatory Visit (INDEPENDENT_AMBULATORY_CARE_PROVIDER_SITE_OTHER): Payer: BLUE CROSS/BLUE SHIELD | Admitting: Family Medicine

## 2017-06-25 VITALS — BP 100/70 | HR 79 | Temp 98.5°F | Resp 14 | Ht 63.0 in | Wt 200.1 lb

## 2017-06-25 DIAGNOSIS — K047 Periapical abscess without sinus: Secondary | ICD-10-CM

## 2017-06-25 MED ORDER — AMOXICILLIN-POT CLAVULANATE 875-125 MG PO TABS: 1.0000 | ORAL_TABLET | Freq: Two times a day (BID) | ORAL | 0 refills | Status: DC

## 2017-06-25 NOTE — Progress Notes (Signed)
Name: Jill Davidson   MRN: [id number]    DOB: 1970/10/22   Date:06/25/2017       Progress Note  Subjective  Chief Complaint  Chief Complaint  Patient presents with  . Oral Pain    HPI  Tooth abscess: she states symptoms started 5 days ago. She initially noticed some pain after she ate some oreo cookies, able to work last Friday but was having some pain, 3 days ago pain was intense , swelling noticeable, went to urgent care, given Pen,-V-K, taking medication as prescribed and has noticed some improvement, but still has a lot of pain . Swelling is going down in size. Missed work. Has not looked for dentist yet. She has chills, left ear pain, neck discomfort with movement, no rashes, face is not red but tender to touch. No nausea or vomiting, but has noticed some loose stools ( none today).    Patient Active Problem List   Diagnosis Date Noted  . Obesity (BMI 30.0-34.9) 07/25/2016  . Hypertension 07/25/2016  . Status post endometrial ablation 07/23/2016  . Fibroid, uterine 11/15/2015  . Abnormal uterine bleeding 11/15/2015  . IDA (iron deficiency anemia) 01/25/2015    Past Surgical History:  Procedure Laterality Date  . CESAREAN SECTION     X2  . DILATION AND CURETTAGE OF UTERUS    . DILITATION & CURRETTAGE/HYSTROSCOPY WITH NOVASURE ABLATION N/A 07/23/2016   Procedure: DILATATION & CURETTAGE/HYSTEROSCOPY WITH NOVASURE ABLATION;  Surgeon: Brayton Mars, MD;  Location: [facility] ORS;  Service: Gynecology;  Laterality: N/A;    Family History  Problem Relation Age of Onset  . Diabetes Father   . Hypertension Father   . Colon cancer Maternal Aunt   . Breast cancer Maternal Aunt        great  . Heart disease Maternal Aunt   . Diabetes Maternal Grandmother   . Hypertension Mother   . Hypertension Brother   . Ovarian cancer Neg Hx     Social History   Social History  . Marital status: Single    Spouse name: N/A  . Number of children: N/A  . Years of education: N/A    Occupational History  . Not on file.   Social History Main Topics  . Smoking status: Current Every Day Smoker    Packs/day: 0.25    Years: 25.00    Types: Cigarettes  . Smokeless tobacco: Never Used  . Alcohol use Yes     Comment: once a week  . Drug use: Yes    Types: Marijuana     Comment: once a week  . Sexual activity: Yes    Birth control/ protection: None   Other Topics Concern  . 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  acetaminophen (TYLENOL) 325 MG tablet, Take 650 mg by mouth every 6 (six) hours as needed., Disp: , Rfl:  .  fluticasone (FLONASE) 50 MCG/ACT nasal spray, Place 2 sprays into both nostrils daily., Disp: 16 g, Rfl: 6 .  fluticasone furoate-vilanterol (BREO ELLIPTA) 100-25 MCG/INH AEPB, Inhale 1 puff into the lungs daily., Disp: 60 each, Rfl: 0 .  losartan (COZAAR) 25 MG tablet, Take 25 mg by mouth daily., Disp: , Rfl: 3 .  montelukast (SINGULAIR) 10 MG tablet, Take 1 tablet (10 mg total) by  mouth daily as needed (for allergies.)., Disp: 90 tablet, Rfl: 1 .  penicillin v potassium (VEETID) 500 MG tablet, , Disp: , Rfl: 0 .  amoxicillin-clavulanate (AUGMENTIN) 875-125 MG tablet, Take 1 tablet by mouth 2 (two) times daily., Disp: 14 tablet, Rfl: 0  No Known Allergies   ROS  Ten systems reviewed and is negative except as mentioned in HPI   Objective  Vitals:   06/25/17 1031  BP: 100/70  Pulse: 79  Resp: 14  Temp: 98.5 F (36.9 C)  TempSrc: Oral  SpO2: 99%  Weight: 200 lb 1.6 oz (90.8 kg)  Height: 5\' 3"  (1.6 m)    Body mass index is 35.45 kg/m.  Physical Exam  Constitutional: Patient appears well-developed and well-nourished. Obese  No distress.  HEENT: head atraumatic, normocephalic, pupils equal and reactive to light, ears normal TM, swollen and tender around last  molar on left lower jaw, also swelling on left cheek , positive lymphadenopathy on left anterior neck,  neck supple, able to move neck laterally,  throat within normal limits Cardiovascular: Normal rate, regular rhythm and normal heart sounds.  No murmur heard. No BLE edema. Pulmonary/Chest: Effort normal and breath sounds normal. No respiratory distress. Abdominal: Soft.  There is no tenderness. Psychiatric: Patient has a normal mood and affect. behavior is normal. Judgment and thought content normal.  Recent Results (from the past 2160 hour(s))  CBC with Differential/Platelet     Status: Abnormal   Collection Time: 06/03/17  2:08 PM  Result Value Ref Range   WBC 10.0 3.8 - 10.8 Thousand/uL   RBC 4.28 3.80 - 5.10 Million/uL   Hemoglobin 11.4 (L) 11.7 - 15.5 g/dL   HCT 35.7 35.0 - 45.0 %   MCV 83.4 80.0 - 100.0 fL   MCH 26.6 (L) 27.0 - 33.0 pg   MCHC 31.9 (L) 32.0 - 36.0 g/dL   RDW 15.2 (H) 11.0 - 15.0 %   Platelets 406 (H) 140 - 400 Thousand/uL   MPV 10.0 7.5 - 12.5 fL   Neutro Abs 5,560 1,500 - 7,800 cells/uL   Lymphs Abs 3,870 850 - 3,900 cells/uL   WBC mixed population 430 200 - 950 cells/uL   Eosinophils Absolute 90 15 - 500 cells/uL   Basophils Absolute 50 0 - 200 cells/uL   Neutrophils Relative % 55.6 %   Total Lymphocyte 38.7 %   Monocytes Relative 4.3 %   Eosinophils Relative 0.9 %   Basophils Relative 0.5 %  COMPLETE METABOLIC PANEL WITH GFR     Status: None   Collection Time: 06/03/17  2:08 PM  Result Value Ref Range   Glucose, Bld 91 65 - 99 mg/dL    Comment: .            Fasting reference interval .    BUN 12 7 - 25 mg/dL   Creat 0.68 0.50 - 1.10 mg/dL   GFR, Est Non African American 106 > OR = 60 mL/min/1.98m2   GFR, Est African American 123 > OR = 60 mL/min/1.64m2   BUN/Creatinine Ratio NOT APPLICABLE 6 - 22 (calc)   Sodium 139 135 - 146 mmol/L   Potassium 3.9 3.5 - 5.3 mmol/L   Chloride 106 98 - 110 mmol/L   CO2 27 20 - 32 mmol/L   Calcium 9.0 8.6 - 10.2  mg/dL   Total Protein 6.8 6.1 - 8.1 g/dL   Albumin 3.7 3.6 - 5.1 g/dL   Globulin 3.1 1.9 - 3.7 g/dL (calc)   AG  Ratio 1.2 1.0 - 2.5 (calc)   Total Bilirubin 0.4 0.2 - 1.2 mg/dL   Alkaline phosphatase (APISO) 46 33 - 115 U/L   AST 17 10 - 30 U/L   ALT 11 6 - 29 U/L  Hemoglobin A1c     Status: None   Collection Time: 06/03/17  2:08 PM  Result Value Ref Range   Hgb A1c MFr Bld 5.2 <5.7 % of total Hgb    Comment: For the purpose of screening for the presence of diabetes: . <5.7%       Consistent with the absence of diabetes 5.7-6.4%    Consistent with increased risk for diabetes             (prediabetes) > or =6.5%  Consistent with diabetes . This assay result is consistent with a decreased risk of diabetes. . Currently, no consensus exists regarding use of hemoglobin A1c for diagnosis of diabetes in children. . According to American Diabetes Association (ADA) guidelines, hemoglobin A1c <7.0% represents optimal control in non-pregnant diabetic patients. Different metrics may apply to specific patient populations.  Standards of Medical Care in Diabetes(ADA). .    Mean Plasma Glucose 103 (calc)   eAG (mmol/L) 5.7 (calc)  Lipid panel     Status: Abnormal   Collection Time: 06/03/17  2:08 PM  Result Value Ref Range   Cholesterol 146 <200 mg/dL   HDL 44 (L) >50 mg/dL   Triglycerides 71 <150 mg/dL   LDL Cholesterol (Calc) 86 mg/dL (calc)    Comment: Reference range: <100 . Desirable range <100 mg/dL for primary prevention;   <70 mg/dL for patients with CHD or diabetic patients  with > or = 2 CHD risk factors. Marland Kitchen LDL-C is now calculated using the Martin-Hopkins  calculation, which is a validated novel method providing  better accuracy than the Friedewald equation in the  estimation of LDL-C.  Cresenciano Genre et al. Annamaria Helling. 3016;010(93): 2061-2068  (http://education.[facility].com/faq/FAQ164)    Total CHOL/HDL Ratio 3.3 <5.0 (calc)   Non-HDL Cholesterol (Calc) 102 <130 mg/dL  (calc)    Comment: For patients with diabetes plus 1 major ASCVD risk  factor, treating to a non-HDL-C goal of <100 mg/dL  (LDL-C of <70 mg/dL) is considered a therapeutic  option.   Insulin, fasting     Status: None   Collection Time: 06/03/17  2:08 PM  Result Value Ref Range   Insulin 4.3 2.0 - 19.6 uIU/mL    Comment: This insulin assay shows strong cross-reactivity for some insulin analogs (lispro, aspart, and glargine) and much lower cross-reactivity with others (detemir, glulisine).   VITAMIN D 25 Hydroxy (Vit-D Deficiency, Fractures)     Status: Abnormal   Collection Time: 06/03/17  2:08 PM  Result Value Ref Range   Vit D, 25-Hydroxy 26 (L) 30 - 100 ng/mL    Comment: Vitamin D Status         25-OH Vitamin D: . Deficiency:                    <20 ng/mL Insufficiency:             20 - 29 ng/mL Optimal:                 > or = 30 ng/mL . For 25-OH Vitamin D testing on patients on  D2-supplementation and patients for whom quantitation  of D2 and D3 fractions is required, the [facility]ureD(TM) 25-OH VIT D, (D2,D3), LC/MS/MS is recommended: order  code [phone] (patients >  53yrs). . For more information on this test, go to: http://education.[facility].com/faq/FAQ163 (This link is being provided for  informational/educational purposes only.)   Iron     Status: Abnormal   Collection Time: 06/03/17  2:08 PM  Result Value Ref Range   Iron 21 (L) 40 - 190 mcg/dL  TEST AUTHORIZATION     Status: None   Collection Time: 06/03/17  2:08 PM  Result Value Ref Range   TEST NAME: IRON, TOTAL    TEST CODE: [id number]    CLIENT CONTACT: Ambrea Hegler    REPORT ALWAYS MESSAGE SIGNATURE      Comment: . The laboratory testing on this patient was verbally requested or confirmed by the ordering physician or his or her authorized representative after contact with an employee of [employer]. Federal regulations require that we maintain on file written authorization for all laboratory testing.   Accordingly we are asking that the ordering physician or his or her authorized representative sign a copy of this report and promptly return it to the client service representative. . . Signature:____________________________________________________ . Please fax this signed page to [phone] or return it via your [employer] courier.      PHQ2/9: Depression screen [facility] 2/9 06/03/2017 08/15/2015  Decreased Interest 0 0  Down, Depressed, Hopeless 0 0  PHQ - 2 Score 0 0    Fall Risk: Fall Risk  06/03/2017 08/15/2015  Falls in the past year? No No     Assessment & Plan  1. Dental abscess  - amoxicillin-clavulanate (AUGMENTIN) 875-125 MG tablet; Take 1 tablet by mouth 2 (two) times daily.  Dispense: 14 tablet; Refill: 0  Must see dentist within the next 48 hours, explained risk of worsening of the condition and need for admission and IV antibiotics.  She understands and will look for dentist today   -Red flags and when to present for emergency care or RTC including fever >101.1F, chest pain, shortness of breath, new/worsening/un-resolving symptoms, reviewed with patient at time of visit. Follow up and care instructions discussed and provided in AVS.

## 2017-07-03 ENCOUNTER — Encounter: Payer: Self-pay | Admitting: Family Medicine

## 2017-07-03 ENCOUNTER — Ambulatory Visit (INDEPENDENT_AMBULATORY_CARE_PROVIDER_SITE_OTHER): Payer: BLUE CROSS/BLUE SHIELD | Admitting: Family Medicine

## 2017-07-03 ENCOUNTER — Other Ambulatory Visit: Payer: Self-pay | Admitting: Family Medicine

## 2017-07-03 VITALS — BP 142/88 | HR 74 | Temp 97.8°F | Resp 16 | Ht 64.0 in | Wt 198.2 lb

## 2017-07-03 DIAGNOSIS — Z113 Encounter for screening for infections with a predominantly sexual mode of transmission: Secondary | ICD-10-CM | POA: Diagnosis not present

## 2017-07-03 DIAGNOSIS — I10 Essential (primary) hypertension: Secondary | ICD-10-CM | POA: Diagnosis not present

## 2017-07-03 DIAGNOSIS — M7661 Achilles tendinitis, right leg: Secondary | ICD-10-CM

## 2017-07-03 DIAGNOSIS — Z124 Encounter for screening for malignant neoplasm of cervix: Secondary | ICD-10-CM

## 2017-07-03 DIAGNOSIS — Z23 Encounter for immunization: Secondary | ICD-10-CM

## 2017-07-03 DIAGNOSIS — J40 Bronchitis, not specified as acute or chronic: Secondary | ICD-10-CM

## 2017-07-03 DIAGNOSIS — Z01419 Encounter for gynecological examination (general) (routine) without abnormal findings: Secondary | ICD-10-CM | POA: Diagnosis not present

## 2017-07-03 DIAGNOSIS — Z1239 Encounter for other screening for malignant neoplasm of breast: Secondary | ICD-10-CM

## 2017-07-03 NOTE — Progress Notes (Signed)
Name: Jill Davidson   MRN: [id number]    DOB: 10-07-1970   Date:07/03/2017       Progress Note  Subjective  Chief Complaint  Chief Complaint  Patient presents with  . Annual Exam    HPI   Patient presents for annual CPE and also pain right achilles  Achilles tendon: pain on right achilles, going on for the past 2 weeks, improves with rest, worse when walking or going up stair. No swelling or redness. No injury or change in activity  HTN: she states she went two days without Coozar but took a dose before she came in, bp is very high today, no headache, chest pain, palpitation or SOB. She smoked right before she came in , recheck bp about 15 minutes later still elevated.   Diet: eats out on a regular basis Current Exercise Habits: Home exercise routine, Type of exercise: walking, Time (Minutes): 45, Frequency (Times/Week): 3, Weekly Exercise (Minutes/Week): 135, Intensity: Moderate     USPSTF grade A and B recommendations  Depression:  Depression screen [facility] 2/9 06/03/2017 08/15/2015  Decreased Interest 0 0  Down, Depressed, Hopeless 0 0  PHQ - 2 Score 0 0   Hypertension: BP Readings from Last 3 Encounters:  07/03/17 (!) 150/100  06/25/17 100/70  06/03/17 126/84   Obesity: Wt Readings from Last 3 Encounters:  07/03/17 198 lb 3.2 oz (89.9 kg)  06/25/17 200 lb 1.6 oz (90.8 kg)  06/03/17 205 lb (93 kg)   BMI Readings from Last 3 Encounters:  07/03/17 34.02 kg/m  06/25/17 35.45 kg/m  06/03/17 36.31 kg/m    Alcohol: occasionally, about once a week. CAGE: negative Tobacco use: half pack daily, since age 29 yo, she wants to quit, she would like to try medication STD testing and prevention (chl/gon/syphilis): ordering today  Intimate partner violence:none  Sexual History/Pain during Intercourse: uncomfortable with deep penetration Menstrual History/LMP: history of ablation, has regular cycles now but lasts only 4 days.LMP 06/23/2017 Incontinence Symptoms: ; denies    Advanced Care Planning: A voluntary discussion about advance care planning including the explanation and discussion of advance directives.  Discussed health care proxy and Living will, and the patient was able to identify a health care proxy as daughter Jill Davidson)   Patient does not have a living will at present time. If patient does have living will, I have requested they bring this to the clinic to be scanned in to their chart.  Breast cancer:  Order mammgoram  Cervical cancer screening: due today   Lipids:  Lab Results  Component Value Date   CHOL 146 06/03/2017   CHOL 154 07/25/2016   CHOL 138 08/15/2015   Lab Results  Component Value Date   HDL 44 (L) 06/03/2017   HDL 37 (L) 07/25/2016   HDL 45 08/15/2015   Lab Results  Component Value Date   LDLCALC 104 (H) 07/25/2016   LDLCALC 80 08/15/2015   Lab Results  Component Value Date   TRIG 71 06/03/2017   TRIG 64 07/25/2016   TRIG 66 08/15/2015   Lab Results  Component Value Date   CHOLHDL 3.3 06/03/2017   CHOLHDL 4.2 07/25/2016   CHOLHDL 3.1 08/15/2015   No results found for: LDLDIRECT  Glucose:  Glucose, Bld  Date Value Ref Range Status  06/03/2017 91 65 - 99 mg/dL Final    Comment:    .            Fasting reference interval .  07/25/2016 79 65 - 99 mg/dL Final  08/26/2015 95 65 - 99 mg/dL Final     ECG:07/2016   Patient Active Problem List   Diagnosis Date Noted  . Obesity (BMI 30.0-34.9) 07/25/2016  . Hypertension 07/25/2016  . Status post endometrial ablation 07/23/2016  . Fibroid, uterine 11/15/2015  . Abnormal uterine bleeding 11/15/2015  . IDA (iron deficiency anemia) 01/25/2015    Past Surgical History:  Procedure Laterality Date  . CESAREAN SECTION     X2  . DILATION AND CURETTAGE OF UTERUS    . DILITATION & CURRETTAGE/HYSTROSCOPY WITH NOVASURE ABLATION N/A 07/23/2016   Procedure: DILATATION & CURETTAGE/HYSTEROSCOPY WITH NOVASURE ABLATION;  Surgeon: Brayton Mars, MD;   Location: [facility] ORS;  Service: Gynecology;  Laterality: N/A;    Family History  Problem Relation Age of Onset  . Diabetes Father   . Hypertension Father   . Colon cancer Maternal Aunt   . Breast cancer Maternal Aunt        great  . Heart disease Maternal Aunt   . Diabetes Maternal Grandmother   . Hypertension Mother   . Thyroid disease Mother   . Lung cancer Mother   . Asthma Mother   . Hypertension Brother   . Ovarian cancer Neg Hx     Social History   Social History  . Marital status: Single    Spouse name: N/A  . Number of children: N/A  . Years of education: N/A   Occupational History  . Not on file.   Social History Main Topics  . Smoking status: Current Every Day Smoker    Packs/day: 0.25    Years: 25.00    Types: Cigarettes    Start date: 07/03/1992  . Smokeless tobacco: Never Used  . Alcohol use Yes     Comment: every 2-3 weeks  . Drug use: Yes    Types: Marijuana     Comment: once a week  . Sexual activity: Yes    Birth control/ protection: None     Comment: Ablation   Other Topics Concern  . 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  acetaminophen (TYLENOL) 325 MG tablet, Take 650 mg by mouth every 6 (six) hours as needed., Disp: , Rfl:  .  fluticasone (FLONASE) 50 MCG/ACT nasal spray, Place 2 sprays into both nostrils daily., Disp: 16 g, Rfl: 6 .  losartan (COZAAR) 25 MG tablet, Take 25 mg by mouth daily., Disp: , Rfl: 3 .  montelukast (SINGULAIR) 10 MG tablet, Take 1 tablet (10 mg total) by mouth daily as needed (for allergies.)., Disp: 90 tablet, Rfl: 1  No Known Allergies   ROS   Constitutional: Negative for fever or weight change.  Respiratory: Negative for cough and shortness of breath.   Cardiovascular: Negative for chest pain or palpitations.  Gastrointestinal:  Negative for abdominal pain, no bowel changes.  Musculoskeletal: Negative for gait problem or joint swelling.  Skin: Negative for rash.  Neurological: Negative for dizziness or headache.  No other specific complaints in a complete review of systems (except as listed in HPI above).   Objective  Vitals:   07/03/17 1406 07/03/17 1442  BP: (!) 152/90 (!) 150/100  Pulse: 74   Resp: 16   Temp: 97.8 F (36.6 C)   TempSrc:  Oral   SpO2: 98%   Weight: 198 lb 3.2 oz (89.9 kg)   Height: 5\' 4"  (1.626 m)      Body mass index is 34.02 kg/m.  Physical Exam  Constitutional: Patient appears well-developed and well-nourished. No distress.  HENT: Head: Normocephalic and atraumatic. Ears: B TMs ok, no erythema or effusion; Nose: Nose normal. Mouth/Throat: Oropharynx is clear and moist. No oropharyngeal exudate.  Eyes: Conjunctivae and EOM are normal. Pupils are equal, round, and reactive to light. No scleral icterus.  Neck: Normal range of motion. Neck supple. No JVD present. No thyromegaly present.  Cardiovascular: Normal rate, regular rhythm and normal heart sounds.  No murmur heard. No BLE edema. Pulmonary/Chest: Effort normal and breath sounds normal. No respiratory distress. Abdominal: Soft. Bowel sounds are normal, no distension. There is no tenderness. no masses Breast: no lumps or masses, no nipple discharge or rashes FEMALE GENITALIA:  External genitalia normal External urethra normal Vaginal vault normal without discharge or lesions Cervix normal without discharge or lesions Bimanual exam normal without masses RECTAL: not done Musculoskeletal: Normal range of motion, no joint effusions. No gross deformities, she has tenderness with during palpation of right achilles tendon, but no pain with rom against resistance Neurological: he is alert and oriented to person, place, and time. No cranial nerve deficit. Coordination, balance, strength, speech and gait are normal.  Skin: Skin is  warm and dry. No rash noted. No erythema.  Psychiatric: Patient has a normal mood and affect. behavior is normal. Judgment and thought content normal.   Recent Results (from the past 2160 hour(s))  CBC with Differential/Platelet     Status: Abnormal   Collection Time: 06/03/17  2:08 PM  Result Value Ref Range   WBC 10.0 3.8 - 10.8 Thousand/uL   RBC 4.28 3.80 - 5.10 Million/uL   Hemoglobin 11.4 (L) 11.7 - 15.5 g/dL   HCT 35.7 35.0 - 45.0 %   MCV 83.4 80.0 - 100.0 fL   MCH 26.6 (L) 27.0 - 33.0 pg   MCHC 31.9 (L) 32.0 - 36.0 g/dL   RDW 15.2 (H) 11.0 - 15.0 %   Platelets 406 (H) 140 - 400 Thousand/uL   MPV 10.0 7.5 - 12.5 fL   Neutro Abs 5,560 1,500 - 7,800 cells/uL   Lymphs Abs 3,870 850 - 3,900 cells/uL   WBC mixed population 430 200 - 950 cells/uL   Eosinophils Absolute 90 15 - 500 cells/uL   Basophils Absolute 50 0 - 200 cells/uL   Neutrophils Relative % 55.6 %   Total Lymphocyte 38.7 %   Monocytes Relative 4.3 %   Eosinophils Relative 0.9 %   Basophils Relative 0.5 %  COMPLETE METABOLIC PANEL WITH GFR     Status: None   Collection Time: 06/03/17  2:08 PM  Result Value Ref Range   Glucose, Bld 91 65 - 99 mg/dL    Comment: .            Fasting reference interval .    BUN 12 7 - 25 mg/dL   Creat 0.68 0.50 - 1.10 mg/dL   GFR, Est Non African American 106 > OR = 60 mL/min/1.42m2   GFR, Est African American 123 > OR = 60 mL/min/1.23m2   BUN/Creatinine Ratio NOT APPLICABLE 6 - 22 (calc)   Sodium 139 135 - 146 mmol/L   Potassium 3.9 3.5 - 5.3 mmol/L   Chloride 106 98 - 110 mmol/L   CO2 27 20 - 32 mmol/L   Calcium 9.0  8.6 - 10.2 mg/dL   Total Protein 6.8 6.1 - 8.1 g/dL   Albumin 3.7 3.6 - 5.1 g/dL   Globulin 3.1 1.9 - 3.7 g/dL (calc)   AG Ratio 1.2 1.0 - 2.5 (calc)   Total Bilirubin 0.4 0.2 - 1.2 mg/dL   Alkaline phosphatase (APISO) 46 33 - 115 U/L   AST 17 10 - 30 U/L   ALT 11 6 - 29 U/L  Hemoglobin A1c     Status: None   Collection Time: 06/03/17  2:08 PM  Result  Value Ref Range   Hgb A1c MFr Bld 5.2 <5.7 % of total Hgb    Comment: For the purpose of screening for the presence of diabetes: . <5.7%       Consistent with the absence of diabetes 5.7-6.4%    Consistent with increased risk for diabetes             (prediabetes) > or =6.5%  Consistent with diabetes . This assay result is consistent with a decreased risk of diabetes. . Currently, no consensus exists regarding use of hemoglobin A1c for diagnosis of diabetes in children. . According to American Diabetes Association (ADA) guidelines, hemoglobin A1c <7.0% represents optimal control in non-pregnant diabetic patients. Different metrics may apply to specific patient populations.  Standards of Medical Care in Diabetes(ADA). .    Mean Plasma Glucose 103 (calc)   eAG (mmol/L) 5.7 (calc)  Lipid panel     Status: Abnormal   Collection Time: 06/03/17  2:08 PM  Result Value Ref Range   Cholesterol 146 <200 mg/dL   HDL 44 (L) >50 mg/dL   Triglycerides 71 <150 mg/dL   LDL Cholesterol (Calc) 86 mg/dL (calc)    Comment: Reference range: <100 . Desirable range <100 mg/dL for primary prevention;   <70 mg/dL for patients with CHD or diabetic patients  with > or = 2 CHD risk factors. Marland Kitchen LDL-C is now calculated using the Martin-Hopkins  calculation, which is a validated novel method providing  better accuracy than the Friedewald equation in the  estimation of LDL-C.  Cresenciano Genre et al. Annamaria Helling. 8250;539(76): 2061-2068  (http://education.[facility].com/faq/FAQ164)    Total CHOL/HDL Ratio 3.3 <5.0 (calc)   Non-HDL Cholesterol (Calc) 102 <130 mg/dL (calc)    Comment: For patients with diabetes plus 1 major ASCVD risk  factor, treating to a non-HDL-C goal of <100 mg/dL  (LDL-C of <70 mg/dL) is considered a therapeutic  option.   Insulin, fasting     Status: None   Collection Time: 06/03/17  2:08 PM  Result Value Ref Range   Insulin 4.3 2.0 - 19.6 uIU/mL    Comment: This insulin assay  shows strong cross-reactivity for some insulin analogs (lispro, aspart, and glargine) and much lower cross-reactivity with others (detemir, glulisine).   VITAMIN D 25 Hydroxy (Vit-D Deficiency, Fractures)     Status: Abnormal   Collection Time: 06/03/17  2:08 PM  Result Value Ref Range   Vit D, 25-Hydroxy 26 (L) 30 - 100 ng/mL    Comment: Vitamin D Status         25-OH Vitamin D: . Deficiency:                    <20 ng/mL Insufficiency:             20 - 29 ng/mL Optimal:                 > or = 30 ng/mL . For 25-OH Vitamin  D testing on patients on  D2-supplementation and patients for whom quantitation  of D2 and D3 fractions is required, the QuestAssureD(TM) 25-OH VIT D, (D2,D3), LC/MS/MS is recommended: order  code [id number] (patients >19yrs). . For more information on this test, go to: http://education.[facility].com/faq/FAQ163 (This link is being provided for  informational/educational purposes only.)   Iron     Status: Abnormal   Collection Time: 06/03/17  2:08 PM  Result Value Ref Range   Iron 21 (L) 40 - 190 mcg/dL  TEST AUTHORIZATION     Status: None   Collection Time: 06/03/17  2:08 PM  Result Value Ref Range   TEST NAME: IRON, TOTAL    TEST CODE: [id number]    CLIENT CONTACT: Dorise Gangi    REPORT ALWAYS MESSAGE SIGNATURE      Comment: . The laboratory testing on this patient was verbally requested or confirmed by the ordering physician or his or her authorized representative after contact with an employee of [employer]. Federal regulations require that we maintain on file written authorization for all laboratory testing.  Accordingly we are asking that the ordering physician or his or her authorized representative sign a copy of this report and promptly return it to the client service representative. . . Signature:____________________________________________________ . Please fax this signed page to [phone] or return it via your [employer]  courier.       PHQ2/9: Depression screen [facility] 2/9 06/03/2017 08/15/2015  Decreased Interest 0 0  Down, Depressed, Hopeless 0 0  PHQ - 2 Score 0 0    Fall Risk: Fall Risk  06/03/2017 08/15/2015  Falls in the past year? No No    Functional Status Survey: Is the patient deaf or have difficulty hearing?: No Does the patient have difficulty seeing, even when wearing glasses/contacts?: No Does the patient have difficulty concentrating, remembering, or making decisions?: No Does the patient have difficulty walking or climbing stairs?: No Does the patient have difficulty dressing or bathing?: No Does the patient have difficulty doing errands alone such as visiting a doctor's office or shopping?: No   Assessment & Plan  1. Well woman exam  We will check for STI's  2. Need for immunization against influenza  Refused   3. Cervical cancer screening  - Pap IG, CT/NG NAA, and HPV (high risk)  4. Breast cancer screening  - MM Digital Screening; Future  5. Routine screening for STI (sexually transmitted infection)  - HIV antibody - RPR  6. Uncontrolled hypertension  bp usually well controlled, but elevated times two today, she states she smoked right before she came in, explained importance of quitting smoking, she will monitor bp at home and if remains high we will add HCTZ  7. Achilles tendinitis of right lower extremity  Gave her exercises to do at home and discussed shoe wear  -USPSTF grade A and B recommendations reviewed with patient; age-appropriate recommendations, preventive care, screening tests, etc discussed and encouraged; healthy living encouraged; see AVS for patient education given to patient -Discussed importance of 150 minutes of physical activity weekly, eat two servings of fish weekly, eat one serving of tree nuts ( cashews, pistachios, pecans, almonds.Marland Kitchen) every other day, eat 6 servings of fruit/vegetables daily and drink plenty of water and avoid sweet  beverages.

## 2017-07-03 NOTE — Telephone Encounter (Signed)
Refill request for general medication. Breo to [facility].  Last office visit: 06/25/2017

## 2017-07-04 LAB — PAP IG, CT-NG NAA, HPV HIGH-RISK
C. trachomatis RNA, TMA: NOT DETECTED
HPV DNA High Risk: NOT DETECTED
N. GONORRHOEAE RNA, TMA: NOT DETECTED

## 2017-07-04 LAB — HIV ANTIBODY (ROUTINE TESTING W REFLEX): HIV 1&2 Ab, 4th Generation: NONREACTIVE

## 2017-07-04 LAB — RPR: RPR Ser Ql: NONREACTIVE

## 2017-07-23 ENCOUNTER — Telehealth: Payer: Self-pay | Admitting: Hematology and Oncology

## 2017-07-23 NOTE — Telephone Encounter (Signed)
Left message for patient regarding appointment updates per 11/5 sch message.

## 2017-07-24 ENCOUNTER — Ambulatory Visit: Payer: BLUE CROSS/BLUE SHIELD | Admitting: Hematology and Oncology

## 2017-08-02 ENCOUNTER — Encounter: Payer: Self-pay | Admitting: Hematology and Oncology

## 2017-08-02 ENCOUNTER — Telehealth: Payer: Self-pay | Admitting: Hematology and Oncology

## 2017-08-02 ENCOUNTER — Ambulatory Visit (HOSPITAL_BASED_OUTPATIENT_CLINIC_OR_DEPARTMENT_OTHER): Payer: BLUE CROSS/BLUE SHIELD | Admitting: Hematology and Oncology

## 2017-08-02 ENCOUNTER — Ambulatory Visit (HOSPITAL_BASED_OUTPATIENT_CLINIC_OR_DEPARTMENT_OTHER): Payer: BLUE CROSS/BLUE SHIELD

## 2017-08-02 VITALS — BP 124/75 | HR 66 | Temp 99.1°F | Resp 18 | Ht 64.0 in | Wt 193.9 lb

## 2017-08-02 DIAGNOSIS — R5383 Other fatigue: Secondary | ICD-10-CM

## 2017-08-02 DIAGNOSIS — D509 Iron deficiency anemia, unspecified: Secondary | ICD-10-CM

## 2017-08-02 DIAGNOSIS — Z800 Family history of malignant neoplasm of digestive organs: Secondary | ICD-10-CM | POA: Diagnosis not present

## 2017-08-02 DIAGNOSIS — Z801 Family history of malignant neoplasm of trachea, bronchus and lung: Secondary | ICD-10-CM | POA: Diagnosis not present

## 2017-08-02 DIAGNOSIS — Z803 Family history of malignant neoplasm of breast: Secondary | ICD-10-CM | POA: Diagnosis not present

## 2017-08-02 DIAGNOSIS — D508 Other iron deficiency anemias: Secondary | ICD-10-CM | POA: Diagnosis not present

## 2017-08-02 DIAGNOSIS — Z720 Tobacco use: Secondary | ICD-10-CM | POA: Diagnosis not present

## 2017-08-02 LAB — COMPREHENSIVE METABOLIC PANEL
ALT: 13 U/L (ref 0–55)
AST: 16 U/L (ref 5–34)
Albumin: 3.5 g/dL (ref 3.5–5.0)
Alkaline Phosphatase: 52 U/L (ref 40–150)
Anion Gap: 7 mEq/L (ref 3–11)
BUN: 8.8 mg/dL (ref 7.0–26.0)
CALCIUM: 8.9 mg/dL (ref 8.4–10.4)
CHLORIDE: 107 meq/L (ref 98–109)
CO2: 23 mEq/L (ref 22–29)
Creatinine: 0.7 mg/dL (ref 0.6–1.1)
EGFR: >60 mL/min/{1.73_m2} (ref >60)
GLUCOSE: 86 mg/dL (ref 70–140)
POTASSIUM: 3.6 meq/L (ref 3.5–5.1)
SODIUM: 137 meq/L (ref 136–145)
Total Bilirubin: 0.53 mg/dL (ref 0.20–1.20)
Total Protein: 7.2 g/dL (ref 6.4–8.3)

## 2017-08-02 LAB — CBC & DIFF AND RETIC
BASO%: 0.3 % (ref 0.0–2.0)
Basophils Absolute: 0 10*3/uL (ref 0.0–0.1)
EOS ABS: 0.1 10*3/uL (ref 0.0–0.5)
EOS%: 1.5 % (ref 0.0–7.0)
HCT: 35.8 % (ref 34.8–46.6)
HEMOGLOBIN: 11.5 g/dL — AB (ref 11.6–15.9)
Immature Retic Fract: 8.2 % (ref 1.60–10.00)
LYMPH%: 38.9 % (ref 14.0–49.7)
MCH: 26.8 pg (ref 25.1–34.0)
MCHC: 32.1 g/dL (ref 31.5–36.0)
MCV: 83.4 fL (ref 79.5–101.0)
MONO#: 0.7 10*3/uL (ref 0.1–0.9)
MONO%: 7.5 % (ref 0.0–14.0)
NEUT%: 51.8 % (ref 38.4–76.8)
NEUTROS ABS: 4.9 10*3/uL (ref 1.5–6.5)
Platelets: 369 10*3/uL (ref 145–400)
RBC: 4.29 10*6/uL (ref 3.70–5.45)
RDW: 16.5 % — AB (ref 11.2–14.5)
RETIC %: 1.32 % (ref 0.70–2.10)
Retic Ct Abs: 56.63 10*3/uL (ref 33.70–90.70)
WBC: 9.4 10*3/uL (ref 3.9–10.3)
lymph#: 3.7 10*3/uL — ABNORMAL HIGH (ref 0.9–3.3)

## 2017-08-02 LAB — MORPHOLOGY: PLT EST: ADEQUATE

## 2017-08-02 LAB — IRON AND TIBC
%SAT: 18 % — ABNORMAL LOW (ref 21–57)
IRON: 82 ug/dL (ref 41–142)
TIBC: 453 ug/dL — ABNORMAL HIGH (ref 236–444)
UIBC: 370 ug/dL (ref 120–384)

## 2017-08-02 LAB — TSH: TSH: 0.678 m[IU]/L (ref 0.308–3.960)

## 2017-08-02 LAB — FERRITIN: Ferritin: 12 ng/ml (ref 9–269)

## 2017-08-02 LAB — LACTATE DEHYDROGENASE: LDH: 174 U/L (ref 125–245)

## 2017-08-02 NOTE — Telephone Encounter (Signed)
Gave avs and calendar for November  °

## 2017-08-03 LAB — HAPTOGLOBIN: HAPTOGLOBIN: 175 mg/dL (ref 34–200)

## 2017-08-03 LAB — FOLATE: FOLATE: 8.3 ng/mL (ref >3.0)

## 2017-08-03 LAB — VITAMIN B12: VITAMIN B 12: 509 pg/mL (ref 232–1245)

## 2017-08-05 LAB — DIRECT ANTIGLOBULIN TEST (NOT AT ARMC): Coombs', Direct: NEGATIVE

## 2017-08-05 LAB — VITAMIN B1

## 2017-08-12 ENCOUNTER — Ambulatory Visit (HOSPITAL_BASED_OUTPATIENT_CLINIC_OR_DEPARTMENT_OTHER): Payer: BLUE CROSS/BLUE SHIELD | Admitting: Hematology and Oncology

## 2017-08-12 ENCOUNTER — Telehealth: Payer: Self-pay | Admitting: Hematology and Oncology

## 2017-08-12 ENCOUNTER — Encounter: Payer: Self-pay | Admitting: Hematology and Oncology

## 2017-08-12 VITALS — BP 163/93 | HR 69 | Temp 99.3°F | Resp 20 | Ht 64.0 in | Wt 201.1 lb

## 2017-08-12 DIAGNOSIS — Z720 Tobacco use: Secondary | ICD-10-CM

## 2017-08-12 DIAGNOSIS — I10 Essential (primary) hypertension: Secondary | ICD-10-CM | POA: Diagnosis not present

## 2017-08-12 DIAGNOSIS — D508 Other iron deficiency anemias: Secondary | ICD-10-CM

## 2017-08-12 DIAGNOSIS — D509 Iron deficiency anemia, unspecified: Secondary | ICD-10-CM | POA: Diagnosis not present

## 2017-08-12 NOTE — Telephone Encounter (Signed)
Scheduled appt per 11/26 los - Gave patient AVS and calender per los 

## 2017-08-23 ENCOUNTER — Ambulatory Visit (HOSPITAL_BASED_OUTPATIENT_CLINIC_OR_DEPARTMENT_OTHER): Payer: BLUE CROSS/BLUE SHIELD

## 2017-08-23 VITALS — BP 136/92 | HR 65 | Temp 98.0°F | Resp 18

## 2017-08-23 DIAGNOSIS — D509 Iron deficiency anemia, unspecified: Secondary | ICD-10-CM | POA: Diagnosis not present

## 2017-08-23 DIAGNOSIS — D508 Other iron deficiency anemias: Secondary | ICD-10-CM

## 2017-08-23 MED ORDER — NA FERRIC GLUC CPLX IN SUCROSE 12.5 MG/ML IV SOLN
125.0000 mg | Freq: Once | INTRAVENOUS | Status: AC
  Administered 2017-08-23: 125 mg via INTRAVENOUS
  Filled 2017-08-23: qty 10

## 2017-08-23 MED ORDER — SODIUM CHLORIDE 0.9 % IV SOLN
Freq: Once | INTRAVENOUS | Status: AC
  Administered 2017-08-23: 09:00:00 via INTRAVENOUS

## 2017-08-23 NOTE — Patient Instructions (Signed)

## 2017-08-23 NOTE — Progress Notes (Signed)
[facility] New Visit:  Assessment: IDA (iron deficiency anemia) 46 y.o. female with well-documented history of microcytic hypochromic anemia consistent with iron deficiency unsupported by appropriate iron testing of at least 3-year duration.  Most likely iron deficiency precipitated by recurrent uterine bleeding due to history of uterine fibroids.  No history of gastrointestinal bleeding.  Most recent iron labs are 46 years old and require redrawing to assess current state of iron in the body.  Additionally, patient has significant history of malignancies in the family in the maternal line and may require genetic testing in the future.  Plan: --Labs today as outlined below --RTC 1 week to review labs and to plan management strategy  Voice recognition software was used and creation of this note. Despite my best effort at editing the text, some misspelling/errors may have occurred.  Orders Placed This Encounter  Procedures  . CBC & Diff and Retic    Standing Status:   Future    Number of Occurrences:   1    Standing Expiration Date:   08/02/2018  . Morphology    Standing Status:   Future    Number of Occurrences:   1    Standing Expiration Date:   08/02/2018  . Comprehensive metabolic panel    Standing Status:   Future    Number of Occurrences:   1    Standing Expiration Date:   08/02/2018  . Lactate dehydrogenase (LDH)    Standing Status:   Future    Number of Occurrences:   1    Standing Expiration Date:   08/02/2018  . Ferritin    Standing Status:   Future    Number of Occurrences:   1    Standing Expiration Date:   08/02/2018  . Iron and TIBC    Standing Status:   Future    Number of Occurrences:   1    Standing Expiration Date:   08/02/2018  . Folate, Serum    Standing Status:   Future    Number of Occurrences:   1    Standing Expiration Date:   08/02/2018  . Vitamin B12    Standing Status:   Future    Number of Occurrences:   1    Standing  Expiration Date:   08/02/2018  . Haptoglobin    Standing Status:   Future    Number of Occurrences:   1    Standing Expiration Date:   08/02/2018  . TSH    Standing Status:   Future    Number of Occurrences:   1    Standing Expiration Date:   08/02/2018  . Vitamin B1    Standing Status:   Future    Number of Occurrences:   1    Standing Expiration Date:   08/02/2018  . Direct antiglobulin test (Coombs)    Standing Status:   Future    Number of Occurrences:   1    Standing Expiration Date:   08/02/2018    All questions were answered.  . The patient knows to call the clinic with any problems, questions or concerns.  This note was electronically signed.    History of Presenting Illness Jill Davidson 46 y.o. presenting to the [facility] for iron deficiency anemia referred by Dr Steele Sizer.  Patient's past medical history significant for hypertension, obesity, and history of uterine fibroids with pathological bleeding.  Family history significant for maternal aunt with colon cancer, maternal great  aunt with breast cancer, and mother with lung cancer.  No history of ovarian cancer in the family.  Patient has iron deficiency anemia history dating back at least till 2015.  In the past, she has been tried on multiple various oral iron supplementations and has been unable to tolerate any of them due to severe constipation.  In the interim, she has required supplementation by vein, but has not received that for some time now.  At the present time, patient denies any complaints of fevers, chills, night sweats.  No recent bleeding episodes.  Denies any respiratory, gastrointestinal, or genitourinary complaints.  No neurological deficits.  Oncological/hematological History: --Labs, 01/03/15: WBC   9.2, Hgb   9.7, MCV 82.0, MCH 25.1, RDW 17.1, Plt 454; Fe 18, FeSat 4%, TIBC 475, Ferritin 4 --Labs, 08/26/15: WBC 11.5, Hgb   9.7, MCV 79.9, MCH 25.0, RDW 17.0, Plt 385; Fe 13, FeSat 3%, TIBC  410, Ferritin 8 --Labs, 07/20/16: WBC 10.1, Hgb 11.8, MCV 79.9, MCH 27.7, RDW 18.3, Plt 292; --Labs, 06/03/17: WBC 10.0, Hgb 11.4, MCV 83.4, MCH 26.6, RDW 15.5, Plt 406;  Medical History: Past Medical History:  Diagnosis Date  . AR (allergic rhinitis)   . Bacterial vaginosis   . Complication of anesthesia 2008   PT STATES THAT DURING C-SECTION SHE WAS ADMINISTERED A GAS THAT CAUSED HER HEART TO BEAT IRREGULAR- PT HAS HAD NO PROBLEMS WITH THIS SINCE 2008  . Dysrhythmia    DUE TO A GAS THAT WAS ADMINISTERED DURING A 2008 C-SECTION-NO PROBLEMS SINCE  . Fibroid   . GERD (gastroesophageal reflux disease)    OCC  . IDA (iron deficiency anemia) 01/25/2015  . UTI (lower urinary tract infection)     Surgical History: Past Surgical History:  Procedure Laterality Date  . CESAREAN SECTION     X2  . DILATION AND CURETTAGE OF UTERUS    . DILITATION & CURRETTAGE/HYSTROSCOPY WITH NOVASURE ABLATION N/A 07/23/2016   Procedure: DILATATION & CURETTAGE/HYSTEROSCOPY WITH NOVASURE ABLATION;  Surgeon: Brayton Mars, MD;  Location: [facility] ORS;  Service: Gynecology;  Laterality: N/A;    Family History: Family History  Problem Relation Age of Onset  . Diabetes Father   . Hypertension Father   . Colon cancer Maternal Aunt   . Breast cancer Maternal Aunt        great  . Heart disease Maternal Aunt   . Diabetes Maternal Grandmother   . Hypertension Mother   . Thyroid disease Mother   . Lung cancer Mother   . Asthma Mother   . Hypertension Brother   . Ovarian cancer Neg Hx     Social History: Social History   Socioeconomic History  . Marital status: Single    Spouse name: Not on file  . Number of children: Not on file  . Years of education: Not on file  . Highest education level: Not on file  Social Needs  . Financial resource strain: Not on file  . Food insecurity - worry: Not on file  . Food insecurity - inability: Not on file  . Transportation needs - medical: Not on file  .  Transportation needs - non-medical: Not on file  Occupational History  . Not on file  Tobacco Use  . Smoking status: Current Every Day Smoker    Packs/day: 0.25    Years: 25.00    Pack years: 6.25    Types: Cigarettes    Start date: 07/03/1992  . Smokeless tobacco: Never Used  Substance and Sexual  Activity  . Alcohol use: Yes    Comment: every 2-3 weeks  . Drug use: Yes    Types: Marijuana    Comment: once a week  . Sexual activity: Yes    Birth control/protection: None    Comment: Ablation  Other Topics Concern  . 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 acetaminophen (TYLENOL) 325 MG tablet Take 650 mg by mouth every 6 (six) hours as needed.    . fluticasone (FLONASE) 50 MCG/ACT nasal spray Place 2 sprays into both nostrils daily. 16 g 6  . losartan (COZAAR) 25 MG tablet Take 25 mg by mouth daily.  3  . montelukast (SINGULAIR) 10 MG tablet Take 1 tablet (10 mg total) by mouth daily as needed (for allergies.). 90 tablet 1   No current facility-administered medications for this visit.     Review of Systems: Review of Systems  All other systems reviewed and are negative.    PHYSICAL EXAMINATION Blood pressure 124/75, pulse 66, temperature 99.1 F (37.3 C), temperature source Oral, resp. rate 18, height '5\' 4"'  (1.626 m), weight 193 lb 14.4 oz (88 kg), SpO2 100 %.  ECOG PERFORMANCE STATUS: 1 - Symptomatic but completely ambulatory  Physical Exam  Constitutional: She is oriented to person, place, and time and well-developed, well-nourished, and in no distress. No distress.  HENT:  Head: Normocephalic and atraumatic.  Mouth/Throat: Oropharynx is clear and moist. No oropharyngeal exudate.  Eyes: Conjunctivae and EOM are normal.  Pupils are equal, round, and reactive to light. No scleral icterus.  Neck: No thyromegaly present.  Cardiovascular: Normal rate, regular rhythm, normal heart sounds and intact distal pulses.  No murmur heard. Pulmonary/Chest: Effort normal and breath sounds normal. No respiratory distress. She has no wheezes. She has no rales. She exhibits no tenderness.  Abdominal: Soft. Bowel sounds are normal. She exhibits no distension and no mass. There is no tenderness. There is no rebound and no guarding.  Musculoskeletal: She exhibits no edema.  Lymphadenopathy:    She has no cervical adenopathy.  Neurological: She is alert and oriented to person, place, and time. She has normal reflexes. No cranial nerve deficit.  Skin: Skin is warm and dry. No rash noted. She is not diaphoretic. No erythema. No pallor.     LABORATORY DATA: I have personally reviewed the data as listed: Appointment on 08/02/2017  Component Date Value Ref Range Status  . Thiamine 08/02/2017 Test Not Performed.  nmol/L Final   Comment: The specimen submitted does not meet the laboratory's criteria for acceptability. Refer to [employer] [facility] for specimen acceptability criteria. This test was developed and its performance characteristics determined by [facility]. It has not been cleared or approved by the Food and Drug Administration.   Marland Kitchen TSH 08/02/2017 0.678  0.308 - 3.960 m(IU)/L Final  . Coombs', Direct 08/02/2017 Negative  Negative Final  . Haptoglobin 08/02/2017 175  34 - 200 mg/dL Final  . Vitamin B12 08/02/2017 509  232 - 1,245 pg/mL Final  . Folate 08/02/2017 8.3  >3.0 ng/mL Final   Comment: A serum folate concentration of less than 3.1 ng/mL is considered to represent clinical deficiency.   . Iron 08/02/2017 82  41 - 142 ug/dL Final  .  TIBC 08/02/2017 453* 236 - 444 ug/dL Final  . UIBC 08/02/2017 370  120 - 384 ug/dL Final  . %SAT 08/02/2017 18* 21 - 57 % Final  . Ferritin 08/02/2017 12  9 - 269 ng/ml  Final  . LDH 08/02/2017 174  125 - 245 U/L Final  . Sodium 08/02/2017 137  136 - 145 mEq/L Final  . Potassium 08/02/2017 3.6  3.5 - 5.1 mEq/L Final  . Chloride 08/02/2017 107  98 - 109 mEq/L Final  . CO2 08/02/2017 23  22 - 29 mEq/L Final  . Glucose 08/02/2017 86  70 - 140 mg/dl Final   Glucose reference range is for nonfasting patients. Fasting glucose reference range is 70- 100.  Marland Kitchen BUN 08/02/2017 8.8  7.0 - 26.0 mg/dL Final  . Creatinine 08/02/2017 0.7  0.6 - 1.1 mg/dL Final  . Total Bilirubin 08/02/2017 0.53  0.20 - 1.20 mg/dL Final  . Alkaline Phosphatase 08/02/2017 52  40 - 150 U/L Final  . AST 08/02/2017 16  5 - 34 U/L Final  . ALT 08/02/2017 13  0 - 55 U/L Final  . Total Protein 08/02/2017 7.2  6.4 - 8.3 g/dL Final  . Albumin 08/02/2017 3.5  3.5 - 5.0 g/dL Final  . Calcium 08/02/2017 8.9  8.4 - 10.4 mg/dL Final  . Anion Gap 08/02/2017 7  3 - 11 mEq/L Final  . EGFR 08/02/2017 >60  >60 ml/min/1.73 m2 Final   eGFR is calculated using the CKD-EPI Creatinine Equation (2009)  . Polychromasia 08/02/2017 Slight  Slight Final  . White Cell Comments 08/02/2017 C/W auto diff   Final  . PLT EST 08/02/2017 Adequate  Adequate Final  . WBC 08/02/2017 9.4  3.9 - 10.3 10e3/uL Final  . NEUT# 08/02/2017 4.9  1.5 - 6.5 10e3/uL Final  . HGB 08/02/2017 11.5* 11.6 - 15.9 g/dL Final  . HCT 08/02/2017 35.8  34.8 - 46.6 % Final  . Platelets 08/02/2017 369  145 - 400 10e3/uL Final  . MCV 08/02/2017 83.4  79.5 - 101.0 fL Final  . MCH 08/02/2017 26.8  25.1 - 34.0 pg Final  . MCHC 08/02/2017 32.1  31.5 - 36.0 g/dL Final  . RBC 08/02/2017 4.29  3.70 - 5.45 10e6/uL Final  . RDW 08/02/2017 16.5* 11.2 - 14.5 % Final  . lymph# 08/02/2017 3.7* 0.9 - 3.3 10e3/uL Final  . MONO# 08/02/2017 0.7  0.1 - 0.9 10e3/uL Final  . Eosinophils Absolute 08/02/2017 0.1  0.0 - 0.5 10e3/uL Final  . Basophils Absolute 08/02/2017 0.0  0.0 - 0.1 10e3/uL Final  . NEUT% 08/02/2017 51.8  38.4 - 76.8 % Final  . LYMPH% 08/02/2017  38.9  14.0 - 49.7 % Final  . MONO% 08/02/2017 7.5  0.0 - 14.0 % Final  . EOS% 08/02/2017 1.5  0.0 - 7.0 % Final  . BASO% 08/02/2017 0.3  0.0 - 2.0 % Final  . Retic % 08/02/2017 1.32  0.70 - 2.10 % Final  . Retic Ct Abs 08/02/2017 56.63  33.70 - 90.70 10e3/uL Final  . Immature Retic Fract 08/02/2017 8.20  1.60 - 10.00 % Final         Ardath Sax, MD

## 2017-08-23 NOTE — Progress Notes (Signed)
Pt tolerated iron infusion well.  Stayed for 30 min post infusion observation period.  VS stable.  Offered food and drink.

## 2017-08-23 NOTE — Assessment & Plan Note (Signed)
46 y.o. female with well-documented history of microcytic hypochromic anemia consistent with iron deficiency unsupported by appropriate iron testing of at least 3-year duration.  Most likely iron deficiency precipitated by recurrent uterine bleeding due to history of uterine fibroids.  No history of gastrointestinal bleeding.  Most recent iron labs are 46 years old and require redrawing to assess current state of iron in the body.  Additionally, patient has significant history of malignancies in the family in the maternal line and may require genetic testing in the future.  Plan: --Labs today as outlined below --RTC 1 week to review labs and to plan management strategy

## 2017-08-25 NOTE — Assessment & Plan Note (Signed)
46 y.o. female with well-documented history of microcytic hypochromic anemia consistent with iron deficiency unsupported by appropriate iron testing of at least 3-year duration.  Most likely iron deficiency precipitated by recurrent uterine bleeding due to history of uterine fibroids.  No history of gastrointestinal bleeding.  Patient remain severely depleted in iron with stable degree of mild anemia. At this time, recommend initiation of scheduled parenteral iron supplementation with monthly infusions.   Plan: --IV iron monthly --RTC three months with lab work three days prior

## 2017-08-25 NOTE — Progress Notes (Signed)
[facility] Follow-up Visit:  Assessment: IDA (iron deficiency anemia) 46 y.o. female with well-documented history of microcytic hypochromic anemia consistent with iron deficiency unsupported by appropriate iron testing of at least 3-year duration.  Most likely iron deficiency precipitated by recurrent uterine bleeding due to history of uterine fibroids.  No history of gastrointestinal bleeding.  Patient remain severely depleted in iron with stable degree of mild anemia. At this time, recommend initiation of scheduled parenteral iron supplementation with monthly infusions.   Plan: --IV iron monthly --RTC three months with lab work three days prior   Orders Placed This Encounter  Procedures  . CBC with Differential    Standing Status:   Future    Standing Expiration Date:   08/25/2018  . Comprehensive metabolic panel    Standing Status:   Future    Standing Expiration Date:   08/25/2018  . Ferritin    Standing Status:   Future    Standing Expiration Date:   08/25/2018  . Iron and TIBC    Standing Status:   Future    Standing Expiration Date:   08/25/2018    Cancer Staging No matching staging information was found for the patient.  All questions were answered.  . The patient knows to call the clinic with any problems, questions or concerns.  This note was electronically signed.    History of Presenting Illness Jill Davidson 46 y.o. presenting to the [facility] for iron deficiency anemia referred by Dr Steele Sizer.  Patient's past medical history significant for hypertension, obesity, and history of uterine fibroids with pathological bleeding.  Family history significant for maternal aunt with colon cancer, maternal great aunt with breast cancer, and mother with lung cancer.  No history of ovarian cancer in the family.  Patient has iron deficiency anemia history dating back at least till 2015.  In the past, she has been tried on multiple various oral  iron supplementations and has been unable to tolerate any of them due to severe constipation.  In the interim, she has required supplementation by vein, but has not received that for some time now.  At the present time, patient denies any complaints of fevers, chills, night sweats.  No recent bleeding episodes.  Denies any respiratory, gastrointestinal, or genitourinary complaints.  No neurological deficits.  Oncological/hematological History: --Labs, 01/03/15: WBC   9.2, Hgb   9.7, MCV 82.0, MCH 25.1, RDW 17.1, Plt 454; Fe 18, FeSat 4%, TIBC 475, Ferritin 4 --Labs, 08/26/15: WBC 11.5, Hgb   9.7, MCV 79.9, MCH 25.0, RDW 17.0, Plt 385; Fe 13, FeSat 3%, TIBC 410, Ferritin 8 --Labs, 07/20/16: WBC 10.1, Hgb 11.8, MCV 79.9, MCH 27.7, RDW 18.3, Plt 292; --Labs, 06/03/17: WBC 10.0, Hgb 11.4, MCV 83.4, MCH 26.6, RDW 15.5, Plt 406; --Labs, 08/02/17: WBC 9.4, Hgb 11.5, MCV , MCH , RDW , Plt 369; Fe 82, FeSat 18%, TIBC 453, Ferritin 12, Folate 8.3, vitamin B12 509    Medical History: Past Medical History:  Diagnosis Date  . AR (allergic rhinitis)   . Bacterial vaginosis   . Complication of anesthesia 2008   PT STATES THAT DURING C-SECTION SHE WAS ADMINISTERED A GAS THAT CAUSED HER HEART TO BEAT IRREGULAR- PT HAS HAD NO PROBLEMS WITH THIS SINCE 2008  . Dysrhythmia    DUE TO A GAS THAT WAS ADMINISTERED DURING A 2008 C-SECTION-NO PROBLEMS SINCE  . Fibroid   . GERD (gastroesophageal reflux disease)    OCC  . IDA (iron deficiency  anemia) 01/25/2015  . UTI (lower urinary tract infection)     Surgical History: Past Surgical History:  Procedure Laterality Date  . CESAREAN SECTION     X2  . DILATION AND CURETTAGE OF UTERUS    . DILITATION & CURRETTAGE/HYSTROSCOPY WITH NOVASURE ABLATION N/A 07/23/2016   Procedure: DILATATION & CURETTAGE/HYSTEROSCOPY WITH NOVASURE ABLATION;  Surgeon: Brayton Mars, MD;  Location: [facility] ORS;  Service: Gynecology;  Laterality: N/A;    Family History: Family  History  Problem Relation Age of Onset  . Diabetes Father   . Hypertension Father   . Colon cancer Maternal Aunt   . Breast cancer Maternal Aunt        great  . Heart disease Maternal Aunt   . Diabetes Maternal Grandmother   . Hypertension Mother   . Thyroid disease Mother   . Lung cancer Mother   . Asthma Mother   . Hypertension Brother   . Ovarian cancer Neg Hx     Social History: Social History   Socioeconomic History  . Marital status: Single    Spouse name: Not on file  . Number of children: Not on file  . Years of education: Not on file  . Highest education level: Not on file  Social Needs  . Financial resource strain: Not on file  . Food insecurity - worry: Not on file  . Food insecurity - inability: Not on file  . Transportation needs - medical: Not on file  . Transportation needs - non-medical: Not on file  Occupational History  . Not on file  Tobacco Use  . Smoking status: Current Every Day Smoker    Packs/day: 0.25    Years: 25.00    Pack years: 6.25    Types: Cigarettes    Start date: 07/03/1992  . Smokeless tobacco: Never Used  Substance and Sexual Activity  . Alcohol use: Yes    Comment: every 2-3 weeks  . Drug use: Yes    Types: Marijuana    Comment: once a week  . Sexual activity: Yes    Birth control/protection: None    Comment: Ablation  Other Topics Concern  . 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 acetaminophen (TYLENOL) 325 MG tablet Take 650 mg by mouth every 6 (six) hours as needed.    . fluticasone (FLONASE) 50 MCG/ACT nasal spray Place 2 sprays into both nostrils daily. 16 g 6  . losartan (COZAAR) 25 MG tablet Take 25 mg by mouth daily.  3  . montelukast (SINGULAIR) 10 MG tablet Take 1  tablet (10 mg total) by mouth daily as needed (for allergies.). 90 tablet 1   No current facility-administered medications for this visit.     Review of Systems: Review of Systems  All other systems reviewed and are negative.    PHYSICAL EXAMINATION Blood pressure (!) 163/93, pulse 69, temperature 99.3 F (37.4 C), temperature source Oral, resp. rate 20, height '5\' 4"'  (1.626 m), weight 201 lb 1.6 oz (91.2 kg), SpO2 100 %.  ECOG PERFORMANCE STATUS: 1 - Symptomatic but completely ambulatory  Physical Exam  Constitutional: She is oriented to person, place, and time and well-developed, well-nourished, and in no distress. No distress.  HENT:  Head: Normocephalic  and atraumatic.  Mouth/Throat: Oropharynx is clear and moist. No oropharyngeal exudate.  Eyes: Conjunctivae and EOM are normal. Pupils are equal, round, and reactive to light. No scleral icterus.  Neck: No thyromegaly present.  Cardiovascular: Normal rate, regular rhythm, normal heart sounds and intact distal pulses.  No murmur heard. Pulmonary/Chest: Effort normal and breath sounds normal. No respiratory distress. She has no wheezes. She has no rales. She exhibits no tenderness.  Abdominal: Soft. Bowel sounds are normal. She exhibits no distension and no mass. There is no tenderness. There is no rebound and no guarding.  Musculoskeletal: She exhibits no edema.  Lymphadenopathy:    She has no cervical adenopathy.  Neurological: She is alert and oriented to person, place, and time. She has normal reflexes. No cranial nerve deficit.  Skin: Skin is warm and dry. No rash noted. She is not diaphoretic. No erythema. No pallor.     LABORATORY DATA: I have personally reviewed the data as listed: No visits with results within 1 Week(s) from this visit.  Latest known visit with results is:  Appointment on 08/02/2017  Component Date Value Ref Range Status  . Thiamine 08/02/2017 Test Not Performed.  nmol/L Final   Comment: The  specimen submitted does not meet the laboratory's criteria for acceptability. Refer to [employer] [facility] for specimen acceptability criteria. This test was developed and its performance characteristics determined by [facility]. It has not been cleared or approved by the Food and Drug Administration.   Marland Kitchen TSH 08/02/2017 0.678  0.308 - 3.960 m(IU)/L Final  . Coombs', Direct 08/02/2017 Negative  Negative Final  . Haptoglobin 08/02/2017 175  34 - 200 mg/dL Final  . Vitamin B12 08/02/2017 509  232 - 1,245 pg/mL Final  . Folate 08/02/2017 8.3  >3.0 ng/mL Final   Comment: A serum folate concentration of less than 3.1 ng/mL is considered to represent clinical deficiency.   . Iron 08/02/2017 82  41 - 142 ug/dL Final  . TIBC 08/02/2017 453* 236 - 444 ug/dL Final  . UIBC 08/02/2017 370  120 - 384 ug/dL Final  . %SAT 08/02/2017 18* 21 - 57 % Final  . Ferritin 08/02/2017 12  9 - 269 ng/ml Final  . LDH 08/02/2017 174  125 - 245 U/L Final  . Sodium 08/02/2017 137  136 - 145 mEq/L Final  . Potassium 08/02/2017 3.6  3.5 - 5.1 mEq/L Final  . Chloride 08/02/2017 107  98 - 109 mEq/L Final  . CO2 08/02/2017 23  22 - 29 mEq/L Final  . Glucose 08/02/2017 86  70 - 140 mg/dl Final   Glucose reference range is for nonfasting patients. Fasting glucose reference range is 70- 100.  Marland Kitchen BUN 08/02/2017 8.8  7.0 - 26.0 mg/dL Final  . Creatinine 08/02/2017 0.7  0.6 - 1.1 mg/dL Final  . Total Bilirubin 08/02/2017 0.53  0.20 - 1.20 mg/dL Final  . Alkaline Phosphatase 08/02/2017 52  40 - 150 U/L Final  . AST 08/02/2017 16  5 - 34 U/L Final  . ALT 08/02/2017 13  0 - 55 U/L Final  . Total Protein 08/02/2017 7.2  6.4 - 8.3 g/dL Final  . Albumin 08/02/2017 3.5  3.5 - 5.0 g/dL Final  . Calcium 08/02/2017 8.9  8.4 - 10.4 mg/dL Final  . Anion Gap 08/02/2017 7  3 - 11 mEq/L Final  . EGFR 08/02/2017 >60  >60 ml/min/1.73 m2 Final   eGFR is calculated using the CKD-EPI Creatinine Equation (2009)  . Polychromasia  08/02/2017 Slight  Slight Final  . White Cell Comments 08/02/2017 C/W auto diff   Final  . PLT EST 08/02/2017 Adequate  Adequate Final  . WBC 08/02/2017 9.4  3.9 - 10.3 10e3/uL Final  . NEUT# 08/02/2017 4.9  1.5 - 6.5 10e3/uL Final  . HGB 08/02/2017 11.5* 11.6 - 15.9 g/dL Final  . HCT 08/02/2017 35.8  34.8 - 46.6 % Final  . Platelets 08/02/2017 369  145 - 400 10e3/uL Final  . MCV 08/02/2017 83.4  79.5 - 101.0 fL Final  . MCH 08/02/2017 26.8  25.1 - 34.0 pg Final  . MCHC 08/02/2017 32.1  31.5 - 36.0 g/dL Final  . RBC 08/02/2017 4.29  3.70 - 5.45 10e6/uL Final  . RDW 08/02/2017 16.5* 11.2 - 14.5 % Final  . lymph# 08/02/2017 3.7* 0.9 - 3.3 10e3/uL Final  . MONO# 08/02/2017 0.7  0.1 - 0.9 10e3/uL Final  . Eosinophils Absolute 08/02/2017 0.1  0.0 - 0.5 10e3/uL Final  . Basophils Absolute 08/02/2017 0.0  0.0 - 0.1 10e3/uL Final  . NEUT% 08/02/2017 51.8  38.4 - 76.8 % Final  . LYMPH% 08/02/2017 38.9  14.0 - 49.7 % Final  . MONO% 08/02/2017 7.5  0.0 - 14.0 % Final  . EOS% 08/02/2017 1.5  0.0 - 7.0 % Final  . BASO% 08/02/2017 0.3  0.0 - 2.0 % Final  . Retic % 08/02/2017 1.32  0.70 - 2.10 % Final  . Retic Ct Abs 08/02/2017 56.63  33.70 - 90.70 10e3/uL Final  . Immature Retic Fract 08/02/2017 8.20  1.60 - 10.00 % Final       Ardath Sax, MD

## 2017-09-02 ENCOUNTER — Ambulatory Visit: Payer: BLUE CROSS/BLUE SHIELD | Admitting: Family Medicine

## 2017-09-03 ENCOUNTER — Ambulatory Visit: Payer: BLUE CROSS/BLUE SHIELD | Admitting: Family Medicine

## 2017-09-06 ENCOUNTER — Ambulatory Visit: Payer: BLUE CROSS/BLUE SHIELD | Admitting: Family Medicine

## 2017-09-18 DIAGNOSIS — E559 Vitamin D deficiency, unspecified: Secondary | ICD-10-CM | POA: Diagnosis not present

## 2017-09-18 DIAGNOSIS — D649 Anemia, unspecified: Secondary | ICD-10-CM | POA: Diagnosis not present

## 2017-09-18 DIAGNOSIS — I10 Essential (primary) hypertension: Secondary | ICD-10-CM | POA: Diagnosis not present

## 2017-09-18 DIAGNOSIS — N390 Urinary tract infection, site not specified: Secondary | ICD-10-CM | POA: Diagnosis not present

## 2017-09-23 ENCOUNTER — Inpatient Hospital Stay: Payer: BLUE CROSS/BLUE SHIELD | Attending: Hematology and Oncology

## 2017-09-23 VITALS — BP 141/85 | HR 64 | Temp 98.0°F | Resp 16

## 2017-09-23 DIAGNOSIS — D509 Iron deficiency anemia, unspecified: Secondary | ICD-10-CM | POA: Diagnosis not present

## 2017-09-23 DIAGNOSIS — D508 Other iron deficiency anemias: Secondary | ICD-10-CM

## 2017-09-23 MED ORDER — SODIUM CHLORIDE 0.9 % IV SOLN
Freq: Once | INTRAVENOUS | Status: AC
  Administered 2017-09-23: 09:00:00 via INTRAVENOUS

## 2017-09-23 MED ORDER — SODIUM CHLORIDE 0.9 % IV SOLN
125.0000 mg | Freq: Once | INTRAVENOUS | Status: AC
  Administered 2017-09-23: 125 mg via INTRAVENOUS
  Filled 2017-09-23: qty 10

## 2017-10-22 ENCOUNTER — Inpatient Hospital Stay: Payer: BLUE CROSS/BLUE SHIELD | Attending: Hematology and Oncology

## 2017-10-22 DIAGNOSIS — D508 Other iron deficiency anemias: Secondary | ICD-10-CM

## 2017-10-22 DIAGNOSIS — D509 Iron deficiency anemia, unspecified: Secondary | ICD-10-CM | POA: Insufficient documentation

## 2017-10-22 LAB — COMPREHENSIVE METABOLIC PANEL
ALBUMIN: 3.5 g/dL (ref 3.5–5.0)
ALT: 11 U/L (ref 0–55)
AST: 15 U/L (ref 5–34)
Alkaline Phosphatase: 56 U/L (ref 40–150)
Anion gap: 8 (ref 3–11)
BILIRUBIN TOTAL: 0.3 mg/dL (ref 0.2–1.2)
BUN: 11 mg/dL (ref 7–26)
CO2: 23 mmol/L (ref 22–29)
CREATININE: 0.74 mg/dL (ref 0.60–1.10)
Calcium: 9.2 mg/dL (ref 8.4–10.4)
Chloride: 108 mmol/L (ref 98–109)
GFR calc Af Amer: >60 mL/min (ref >60)
GFR calc non Af Amer: >60 mL/min (ref >60)
GLUCOSE: 96 mg/dL (ref 70–140)
POTASSIUM: 3.8 mmol/L (ref 3.5–5.1)
Sodium: 139 mmol/L (ref 136–145)
TOTAL PROTEIN: 7 g/dL (ref 6.4–8.3)

## 2017-10-22 LAB — CBC WITH DIFFERENTIAL/PLATELET
BASOS ABS: 0.3 10*3/uL — AB (ref 0.0–0.1)
BASOS PCT: 3 %
Eosinophils Absolute: 0.2 10*3/uL (ref 0.0–0.5)
Eosinophils Relative: 2 %
HEMATOCRIT: 38 % (ref 34.8–46.6)
HEMOGLOBIN: 12.4 g/dL (ref 11.6–15.9)
Lymphocytes Relative: 38 %
Lymphs Abs: 4.2 10*3/uL — ABNORMAL HIGH (ref 0.9–3.3)
MCH: 27.6 pg (ref 25.1–34.0)
MCHC: 32.5 g/dL (ref 31.5–36.0)
MCV: 84.8 fL (ref 79.5–101.0)
MONO ABS: 0.7 10*3/uL (ref 0.1–0.9)
Monocytes Relative: 6 %
NEUTROS ABS: 5.6 10*3/uL (ref 1.5–6.5)
NEUTROS PCT: 51 %
Platelets: 355 10*3/uL (ref 145–400)
RBC: 4.48 MIL/uL (ref 3.70–5.45)
RDW: 17.9 % — AB (ref 11.2–14.5)
WBC: 11 10*3/uL — AB (ref 3.9–10.3)

## 2017-10-22 LAB — IRON AND TIBC
Iron: 28 ug/dL — ABNORMAL LOW (ref 41–142)
SATURATION RATIOS: 7 % — AB (ref 21–57)
TIBC: 388 ug/dL (ref 236–444)
UIBC: 359 ug/dL

## 2017-10-22 LAB — FERRITIN: Ferritin: 15 ng/mL (ref 9–269)

## 2017-10-24 ENCOUNTER — Encounter: Payer: Self-pay | Admitting: Hematology and Oncology

## 2017-10-24 ENCOUNTER — Inpatient Hospital Stay: Payer: BLUE CROSS/BLUE SHIELD

## 2017-10-24 ENCOUNTER — Other Ambulatory Visit: Payer: Self-pay

## 2017-10-24 ENCOUNTER — Telehealth: Payer: Self-pay | Admitting: Hematology and Oncology

## 2017-10-24 ENCOUNTER — Inpatient Hospital Stay (HOSPITAL_BASED_OUTPATIENT_CLINIC_OR_DEPARTMENT_OTHER): Payer: BLUE CROSS/BLUE SHIELD | Admitting: Hematology and Oncology

## 2017-10-24 VITALS — BP 143/81 | HR 66 | Temp 98.5°F | Resp 18 | Ht 64.0 in | Wt 200.5 lb

## 2017-10-24 VITALS — BP 121/71 | HR 65

## 2017-10-24 DIAGNOSIS — R5383 Other fatigue: Secondary | ICD-10-CM

## 2017-10-24 DIAGNOSIS — I10 Essential (primary) hypertension: Secondary | ICD-10-CM

## 2017-10-24 DIAGNOSIS — D508 Other iron deficiency anemias: Secondary | ICD-10-CM

## 2017-10-24 DIAGNOSIS — D509 Iron deficiency anemia, unspecified: Secondary | ICD-10-CM | POA: Diagnosis not present

## 2017-10-24 LAB — INFLUENZA PANEL BY PCR (TYPE A & B)
INFLBPCR: NEGATIVE
Influenza A By PCR: NEGATIVE

## 2017-10-24 MED ORDER — SODIUM CHLORIDE 0.9 % IV SOLN
125.0000 mg | Freq: Once | INTRAVENOUS | Status: AC
  Administered 2017-10-24: 125 mg via INTRAVENOUS
  Filled 2017-10-24: qty 10

## 2017-10-24 MED ORDER — SODIUM CHLORIDE 0.9 % IV SOLN
Freq: Once | INTRAVENOUS | Status: AC
  Administered 2017-10-24: 11:00:00 via INTRAVENOUS

## 2017-10-24 NOTE — Telephone Encounter (Signed)
Patient scheduled and given AVS/Calendar for appointments per 2/7 los

## 2017-10-24 NOTE — Progress Notes (Signed)
Pt stayed for entire post infusion 30 min.  Tolerated well.  VS stable.

## 2017-10-24 NOTE — Patient Instructions (Signed)

## 2017-10-31 ENCOUNTER — Inpatient Hospital Stay: Payer: BLUE CROSS/BLUE SHIELD

## 2017-10-31 VITALS — BP 151/81 | HR 65 | Temp 98.3°F | Resp 18

## 2017-10-31 DIAGNOSIS — D508 Other iron deficiency anemias: Secondary | ICD-10-CM

## 2017-10-31 DIAGNOSIS — D509 Iron deficiency anemia, unspecified: Secondary | ICD-10-CM | POA: Diagnosis not present

## 2017-10-31 MED ORDER — SODIUM CHLORIDE 0.9 % IV SOLN
125.0000 mg | Freq: Once | INTRAVENOUS | Status: AC
  Administered 2017-10-31: 125 mg via INTRAVENOUS
  Filled 2017-10-31: qty 10

## 2017-10-31 MED ORDER — SODIUM CHLORIDE 0.9 % IV SOLN
Freq: Once | INTRAVENOUS | Status: AC
  Administered 2017-10-31: 09:00:00 via INTRAVENOUS

## 2017-10-31 NOTE — Patient Instructions (Addendum)
  Sodium Ferric Gluconate Complex injection (Nulecit) What is this medicine? SODIUM FERRIC GLUCONATE COMPLEX (SOE dee um FER ik GLOO koe nate KOM pleks) is an iron replacement. It is used with epoetin therapy to treat low iron levels in patients who are receiving hemodialysis. This medicine may be used for other purposes; ask your health care provider or pharmacist if you have questions. COMMON BRAND NAME(S): Ferrlecit, Nulecit What should I tell my health care provider before I take this medicine? They need to know if you have any of the following conditions: -anemia that is not from iron deficiency -high levels of iron in the body -an unusual or allergic reaction to iron, benzyl alcohol, other medicines, foods, dyes, or preservatives -pregnant or are trying to become pregnant -breast-feeding How should I use this medicine? This medicine is for infusion into a vein. It is given by a health care professional in a hospital or clinic setting. Talk to your pediatrician regarding the use of this medicine in children. While this drug may be prescribed for children as young as 82 years old for selected conditions, precautions do apply. Overdosage: If you think you have taken too much of this medicine contact a poison control center or emergency room at once. NOTE: This medicine is only for you. Do not share this medicine with others. What if I miss a dose? It is important not to miss your dose. Call your doctor or health care professional if you are unable to keep an appointment. What may interact with this medicine? Do not take this medicine with any of the following medications: -deferoxamine -dimercaprol -other iron products This medicine may also interact with the following medications: -chloramphenicol -deferasirox -medicine for blood pressure like enalapril This list may not describe all possible interactions. Give your health care provider a list of all the medicines, herbs,  non-prescription drugs, or dietary supplements you use. Also tell them if you smoke, drink alcohol, or use illegal drugs. Some items may interact with your medicine. What should I watch for while using this medicine? Your condition will be monitored carefully while you are receiving this medicine. Visit your doctor for check-ups as directed. What side effects may I notice from receiving this medicine? Side effects that you should report to your doctor or health care professional as soon as possible: -allergic reactions like skin rash, itching or hives, swelling of the face, lips, or tongue -breathing problems -changes in hearing -changes in vision -chills, flushing, or sweating -fast, irregular heartbeat -feeling faint or lightheaded, falls -fever, flu-like symptoms -high or low blood pressure -pain, tingling, numbness in the hands or feet -severe pain in the chest, back, flanks, or groin -swelling of the ankles, feet, hands -trouble passing urine or change in the amount of urine -unusually weak or tired Side effects that usually do not require medical attention (report to your doctor or health care professional if they continue or are bothersome): -cramps -dark colored stools -diarrhea -headache -nausea, vomiting -stomach upset This list may not describe all possible side effects. Call your doctor for medical advice about side effects. You may report side effects to FDA at 1-800-FDA-1088. Where should I keep my medicine? This drug is given in a hospital or clinic and will not be stored at home. NOTE: This sheet is a summary. It may not cover all possible information. If you have questions about this medicine, talk to your doctor, pharmacist, or health care provider.  2018 Elsevier/Gold Standard (2008-05-05 15:58:57)

## 2017-11-07 ENCOUNTER — Inpatient Hospital Stay: Payer: BLUE CROSS/BLUE SHIELD

## 2017-11-07 VITALS — BP 141/84 | HR 68 | Temp 97.8°F | Resp 18

## 2017-11-07 DIAGNOSIS — D509 Iron deficiency anemia, unspecified: Secondary | ICD-10-CM | POA: Diagnosis not present

## 2017-11-07 DIAGNOSIS — D508 Other iron deficiency anemias: Secondary | ICD-10-CM

## 2017-11-07 MED ORDER — SODIUM CHLORIDE 0.9 % IV SOLN
Freq: Once | INTRAVENOUS | Status: AC
  Administered 2017-11-07: 1 mL via INTRAVENOUS

## 2017-11-07 MED ORDER — SODIUM CHLORIDE 0.9 % IV SOLN
125.0000 mg | Freq: Once | INTRAVENOUS | Status: AC
  Administered 2017-11-07: 125 mg via INTRAVENOUS
  Filled 2017-11-07: qty 10

## 2017-11-07 NOTE — Patient Instructions (Signed)
  Sodium Ferric Gluconate Complex injection (Nulecit) What is this medicine? SODIUM FERRIC GLUCONATE COMPLEX (SOE dee um FER ik GLOO koe nate KOM pleks) is an iron replacement. It is used with epoetin therapy to treat low iron levels in patients who are receiving hemodialysis. This medicine may be used for other purposes; ask your health care provider or pharmacist if you have questions. COMMON BRAND NAME(S): Ferrlecit, Nulecit What should I tell my health care provider before I take this medicine? They need to know if you have any of the following conditions: -anemia that is not from iron deficiency -high levels of iron in the body -an unusual or allergic reaction to iron, benzyl alcohol, other medicines, foods, dyes, or preservatives -pregnant or are trying to become pregnant -breast-feeding How should I use this medicine? This medicine is for infusion into a vein. It is given by a health care professional in a hospital or clinic setting. Talk to your pediatrician regarding the use of this medicine in children. While this drug may be prescribed for children as young as 61 years old for selected conditions, precautions do apply. Overdosage: If you think you have taken too much of this medicine contact a poison control center or emergency room at once. NOTE: This medicine is only for you. Do not share this medicine with others. What if I miss a dose? It is important not to miss your dose. Call your doctor or health care professional if you are unable to keep an appointment. What may interact with this medicine? Do not take this medicine with any of the following medications: -deferoxamine -dimercaprol -other iron products This medicine may also interact with the following medications: -chloramphenicol -deferasirox -medicine for blood pressure like enalapril This list may not describe all possible interactions. Give your health care provider a list of all the medicines, herbs,  non-prescription drugs, or dietary supplements you use. Also tell them if you smoke, drink alcohol, or use illegal drugs. Some items may interact with your medicine. What should I watch for while using this medicine? Your condition will be monitored carefully while you are receiving this medicine. Visit your doctor for check-ups as directed. What side effects may I notice from receiving this medicine? Side effects that you should report to your doctor or health care professional as soon as possible: -allergic reactions like skin rash, itching or hives, swelling of the face, lips, or tongue -breathing problems -changes in hearing -changes in vision -chills, flushing, or sweating -fast, irregular heartbeat -feeling faint or lightheaded, falls -fever, flu-like symptoms -high or low blood pressure -pain, tingling, numbness in the hands or feet -severe pain in the chest, back, flanks, or groin -swelling of the ankles, feet, hands -trouble passing urine or change in the amount of urine -unusually weak or tired Side effects that usually do not require medical attention (report to your doctor or health care professional if they continue or are bothersome): -cramps -dark colored stools -diarrhea -headache -nausea, vomiting -stomach upset This list may not describe all possible side effects. Call your doctor for medical advice about side effects. You may report side effects to FDA at 1-800-FDA-1088. Where should I keep my medicine? This drug is given in a hospital or clinic and will not be stored at home. NOTE: This sheet is a summary. It may not cover all possible information. If you have questions about this medicine, talk to your doctor, pharmacist, or health care provider.  2018 Elsevier/Gold Standard (2008-05-05 15:58:57)

## 2017-11-14 ENCOUNTER — Inpatient Hospital Stay: Payer: BLUE CROSS/BLUE SHIELD

## 2017-11-14 VITALS — BP 162/90 | HR 65 | Temp 98.3°F | Resp 16

## 2017-11-14 DIAGNOSIS — D509 Iron deficiency anemia, unspecified: Secondary | ICD-10-CM | POA: Diagnosis not present

## 2017-11-14 DIAGNOSIS — D508 Other iron deficiency anemias: Secondary | ICD-10-CM

## 2017-11-14 MED ORDER — SODIUM CHLORIDE 0.9 % IV SOLN
Freq: Once | INTRAVENOUS | Status: AC
  Administered 2017-11-14: 09:00:00 via INTRAVENOUS

## 2017-11-14 MED ORDER — SODIUM CHLORIDE 0.9 % IV SOLN
125.0000 mg | Freq: Once | INTRAVENOUS | Status: AC
  Administered 2017-11-14: 125 mg via INTRAVENOUS
  Filled 2017-11-14: qty 10

## 2017-11-14 NOTE — Patient Instructions (Signed)
Sodium Ferric Gluconate Complex injection What is this medicine? SODIUM FERRIC GLUCONATE COMPLEX (SOE dee um FER ik GLOO koe nate KOM pleks) is an iron replacement. It is used with epoetin therapy to treat low iron levels in patients who are receiving hemodialysis. This medicine may be used for other purposes; ask your health care provider or pharmacist if you have questions. COMMON BRAND NAME(S): Ferrlecit, Nulecit What should I tell my health care provider before I take this medicine? They need to know if you have any of the following conditions: -anemia that is not from iron deficiency -high levels of iron in the body -an unusual or allergic reaction to iron, benzyl alcohol, other medicines, foods, dyes, or preservatives -pregnant or are trying to become pregnant -breast-feeding How should I use this medicine? This medicine is for infusion into a vein. It is given by a health care professional in a hospital or clinic setting. Talk to your pediatrician regarding the use of this medicine in children. While this drug may be prescribed for children as young as 6 years old for selected conditions, precautions do apply. Overdosage: If you think you have taken too much of this medicine contact a poison control center or emergency room at once. NOTE: This medicine is only for you. Do not share this medicine with others. What if I miss a dose? It is important not to miss your dose. Call your doctor or health care professional if you are unable to keep an appointment. What may interact with this medicine? Do not take this medicine with any of the following medications: -deferoxamine -dimercaprol -other iron products This medicine may also interact with the following medications: -chloramphenicol -deferasirox -medicine for blood pressure like enalapril This list may not describe all possible interactions. Give your health care provider a list of all the medicines, herbs, non-prescription drugs,  or dietary supplements you use. Also tell them if you smoke, drink alcohol, or use illegal drugs. Some items may interact with your medicine. What should I watch for while using this medicine? Your condition will be monitored carefully while you are receiving this medicine. Visit your doctor for check-ups as directed. What side effects may I notice from receiving this medicine? Side effects that you should report to your doctor or health care professional as soon as possible: -allergic reactions like skin rash, itching or hives, swelling of the face, lips, or tongue -breathing problems -changes in hearing -changes in vision -chills, flushing, or sweating -fast, irregular heartbeat -feeling faint or lightheaded, falls -fever, flu-like symptoms -high or low blood pressure -pain, tingling, numbness in the hands or feet -severe pain in the chest, back, flanks, or groin -swelling of the ankles, feet, hands -trouble passing urine or change in the amount of urine -unusually weak or tired Side effects that usually do not require medical attention (report to your doctor or health care professional if they continue or are bothersome): -cramps -dark colored stools -diarrhea -headache -nausea, vomiting -stomach upset This list may not describe all possible side effects. Call your doctor for medical advice about side effects. You may report side effects to FDA at 1-800-FDA-1088. Where should I keep my medicine? This drug is given in a hospital or clinic and will not be stored at home. NOTE: This sheet is a summary. It may not cover all possible information. If you have questions about this medicine, talk to your doctor, pharmacist, or health care provider.  2018 Elsevier/Gold Standard (2008-05-05 15:58:57)  

## 2017-11-17 NOTE — Assessment & Plan Note (Signed)
47 y.o. female with well-documented history of microcytic hypochromic anemia consistent with iron deficiency unsupported by appropriate iron testing of at least 3-year duration.  Most likely iron deficiency precipitated by recurrent uterine bleeding due to history of uterine fibroids.  No history of gastrointestinal bleeding.  Patient remain severely depleted in iron with interval improvement in the anemia, but continued severe depletion of the iron stores despite parenteral iron supplementation.  It is likely that her menstrual periods are draining her iron faster than where delivering it in conjunction with additional symptoms suspicious for a viral infection this time likely creating an inflammatory state preventing appropriate utilization of the iron.  Plan: -Check for influenza today. -Proceed with Ferrlecit weekly x8 -Return to clinic in 3 months: Labs 3 days prior to the visit, clinic, possible IV iron infusion.

## 2017-11-17 NOTE — Progress Notes (Signed)
[facility] Follow-up Visit:  Assessment: IDA (iron deficiency anemia) 47 y.o. female with well-documented history of microcytic hypochromic anemia consistent with iron deficiency unsupported by appropriate iron testing of at least 3-year duration.  Most likely iron deficiency precipitated by recurrent uterine bleeding due to history of uterine fibroids.  No history of gastrointestinal bleeding.  Patient remain severely depleted in iron with interval improvement in the anemia, but continued severe depletion of the iron stores despite parenteral iron supplementation.  It is likely that her menstrual periods are draining her iron faster than where delivering it in conjunction with additional symptoms suspicious for a viral infection this time likely creating an inflammatory state preventing appropriate utilization of the iron.  Plan: -Check for influenza today. -Proceed with Ferrlecit weekly x8 -Return to clinic in 3 months: Labs 3 days prior to the visit, clinic, possible IV iron infusion.   Orders Placed This Encounter  Procedures  . CBC with Differential (Cancer Center Only)    Standing Status:   Future    Standing Expiration Date:   10/24/2018  . CMP ([facility] only)    Standing Status:   Future    Standing Expiration Date:   10/24/2018  . Iron and TIBC    Standing Status:   Future    Standing Expiration Date:   10/24/2018  . Ferritin    Standing Status:   Future    Standing Expiration Date:   10/24/2018  . Influenza panel by PCR (type A & B)    Standing Status:   Future    Number of Occurrences:   1    Standing Expiration Date:   10/24/2018    Cancer Staging No matching staging information was found for the patient.  All questions were answered.  . The patient knows to call the clinic with any problems, questions or concerns.  This note was electronically signed.    History of Presenting Illness Jill Davidson 47 y.o. presenting to the [facility] for  iron deficiency anemia referred by Dr Steele Sizer.  Patient's past medical history significant for hypertension, obesity, and history of uterine fibroids with pathological bleeding.  Family history significant for maternal aunt with colon cancer, maternal great aunt with breast cancer, and mother with lung cancer.  No history of ovarian cancer in the family.  Patient has iron deficiency anemia history dating back at least till 2015.  In the past, she has been tried on multiple various oral iron supplementations and has been unable to tolerate any of them due to severe constipation.    Since last visit to the clinic, patient has received 2 infusions of Ferrlecit on 08/23/17 and 09/23/17 respectively.  For the past 2 weeks, patient has been experiencing body aches, fatigue, generalized weakness and tiredness.  No fevers, chills, night sweats but feeling hot/cold.  Last menstrual period on 10/10/17 with 2 heavy days requiring pad change at least 6 times per day.  Patient also indicates that yesterday had some chest pain and shortness of breath which have resolved at this time.  Oncological/hematological History: --Labs, 01/03/15: WBC   9.2, Hgb   9.7, MCV 82.0, MCH 25.1,    RDW 17.1, Plt 454; Fe 18, FeSat   4%, TIBC 475, Ferritin   4 --Labs, 08/26/15: WBC 11.5, Hgb   9.7, MCV 79.9, MCH 25.0,    RDW 17.0, Plt 385; Fe 13, FeSat   3%, TIBC 410, Ferritin   8 --Labs, 07/20/16: WBC 10.1, Hgb 11.8,  MCV 79.9, MCH 27.7,    RDW 18.3, Plt 292; --Labs, 06/03/17: WBC 10.0, Hgb 11.4, MCV 83.4, MCH 26.6,    RDW 15.5, Plt 406; --Labs, 08/02/17: WBC   9.4, Hgb 11.5, MCV     ..., MCH     ...,   RDW     ..., Plt 369; Fe 82, FeSat 18%, TIBC 453, Ferritin 12, Folate 8.3, vitamin B12 509 --Treatment:    --Ferrlecit: 08/23/17, 09/23/17 --Labs, 10/22/17: WBC 11.0, Hgb 12.4, MCV 84.8, MCH 27.6, MCHC 32.5, RDW 17.9, Plt 355; Fe 28, FeSat   7%, TIBC 388, Ferritin 15  Medical History: Past Medical History:  Diagnosis Date  .  AR (allergic rhinitis)   . Bacterial vaginosis   . Complication of anesthesia 2008   PT STATES THAT DURING C-SECTION SHE WAS ADMINISTERED A GAS THAT CAUSED HER HEART TO BEAT IRREGULAR- PT HAS HAD NO PROBLEMS WITH THIS SINCE 2008  . Dysrhythmia    DUE TO A GAS THAT WAS ADMINISTERED DURING A 2008 C-SECTION-NO PROBLEMS SINCE  . Fibroid   . GERD (gastroesophageal reflux disease)    OCC  . IDA (iron deficiency anemia) 01/25/2015  . UTI (lower urinary tract infection)     Surgical History: Past Surgical History:  Procedure Laterality Date  . CESAREAN SECTION     X2  . DILATION AND CURETTAGE OF UTERUS    . DILITATION & CURRETTAGE/HYSTROSCOPY WITH NOVASURE ABLATION N/A 07/23/2016   Procedure: DILATATION & CURETTAGE/HYSTEROSCOPY WITH NOVASURE ABLATION;  Surgeon: Brayton Mars, MD;  Location: [facility] ORS;  Service: Gynecology;  Laterality: N/A;    Family History: Family History  Problem Relation Age of Onset  . Diabetes Father   . Hypertension Father   . Colon cancer Maternal Aunt   . Breast cancer Maternal Aunt        great  . Heart disease Maternal Aunt   . Diabetes Maternal Grandmother   . Hypertension Mother   . Thyroid disease Mother   . Lung cancer Mother   . Asthma Mother   . Hypertension Brother   . Ovarian cancer Neg Hx     Social History: Social History   Socioeconomic History  . Marital status: Single    Spouse name: Not on file  . Number of children: Not on file  . Years of education: Not on file  . Highest education level: Not on file  Social Needs  . Financial resource strain: Not on file  . Food insecurity - worry: Not on file  . Food insecurity - inability: Not on file  . Transportation needs - medical: Not on file  . Transportation needs - non-medical: Not on file  Occupational History  . Not on file  Tobacco Use  . Smoking status: Current Every Day Smoker    Packs/day: 0.25    Years: 25.00    Pack years: 6.25    Types: Cigarettes    Start  date: 07/03/1992  . Smokeless tobacco: Never Used  Substance and Sexual Activity  . Alcohol use: Yes    Comment: every 2-3 weeks  . Drug use: Yes    Types: Marijuana    Comment: once a week  . Sexual activity: Yes    Birth control/protection: None    Comment: Ablation  Other Topics Concern  . 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 acetaminophen (TYLENOL) 325 MG tablet Take 650 mg by mouth every 6 (six) hours as needed.    . fluticasone (FLONASE) 50 MCG/ACT nasal spray Place 2 sprays into both nostrils daily. 16 g 6  . losartan (COZAAR) 25 MG tablet Take 25 mg by mouth daily.  3  . montelukast (SINGULAIR) 10 MG tablet Take 1 tablet (10 mg total) by mouth daily as needed (for allergies.). 90 tablet 1   No current facility-administered medications for this visit.     Review of Systems: Review of Systems  Constitutional: Positive for fatigue. Negative for chills and fever.  Respiratory: Positive for shortness of breath.   Cardiovascular: Positive for chest pain.  Musculoskeletal: Positive for myalgias.  All other systems reviewed and are negative.    PHYSICAL EXAMINATION Blood pressure (!) 143/81, pulse 66, temperature 98.5 F (36.9 C), temperature source Oral, resp. rate 18, height '5\' 4"'  (1.626 m), weight 200 lb 8 oz (90.9 kg), SpO2 100 %.  ECOG PERFORMANCE STATUS: 1 - Symptomatic but completely ambulatory  Physical Exam  Constitutional: She is oriented to person, place, and time and well-developed, well-nourished, and in no distress. No distress.  HENT:  Head: Normocephalic and atraumatic.  Mouth/Throat: Oropharynx is clear and moist. No oropharyngeal exudate.  Eyes: Conjunctivae and EOM are normal. Pupils are equal, round, and  reactive to light. No scleral icterus.  Neck: No thyromegaly present.  Cardiovascular: Normal rate, regular rhythm, normal heart sounds and intact distal pulses.  No murmur heard. Pulmonary/Chest: Effort normal and breath sounds normal. No respiratory distress. She has no wheezes. She has no rales. She exhibits no tenderness.  Abdominal: Soft. Bowel sounds are normal. She exhibits no distension and no mass. There is no tenderness. There is no rebound and no guarding.  Musculoskeletal: She exhibits no edema.  Lymphadenopathy:    She has no cervical adenopathy.  Neurological: She is alert and oriented to person, place, and time. She has normal reflexes. No cranial nerve deficit.  Skin: Skin is warm and dry. No rash noted. She is not diaphoretic. No erythema. No pallor.     LABORATORY DATA: I have personally reviewed the data as listed: Appointment on 10/24/2017  Component Date Value Ref Range Status  . Influenza A By PCR 10/24/2017 NEGATIVE  NEGATIVE Final  . Influenza B By PCR 10/24/2017 NEGATIVE  NEGATIVE Final   Comment: (NOTE) The Xpert Xpress Flu assay is intended as an aid in the diagnosis of  influenza and should not be used as a sole basis for treatment.  This  assay is FDA approved for nasopharyngeal swab specimens only. Nasal  washings and aspirates are unacceptable for Xpert Xpress Flu testing. Performed at [facility], [place] [address] [place], [place] [address]   Appointment on 10/22/2017  Component Date Value Ref Range Status  . WBC 10/22/2017 11.0* 3.9 - 10.3 K/uL Final  . RBC 10/22/2017 4.48  3.70 - 5.45 MIL/uL Final  . Hemoglobin 10/22/2017 12.4  11.6 - 15.9 g/dL Final  . HCT 10/22/2017 38.0  34.8 - 46.6 % Final  . MCV 10/22/2017 84.8  79.5 - 101.0 fL Final  . MCH 10/22/2017 27.6  25.1 - 34.0 pg Final  . MCHC 10/22/2017 32.5  31.5 - 36.0 g/dL Final  . RDW 10/22/2017 17.9* 11.2 - 14.5 % Final  . Platelets 10/22/2017 355  145 - 400  K/uL Final  .  Neutrophils Relative % 10/22/2017 51  % Final  . Neutro Abs 10/22/2017 5.6  1.5 - 6.5 K/uL Final  . Lymphocytes Relative 10/22/2017 38  % Final  . Lymphs Abs 10/22/2017 4.2* 0.9 - 3.3 K/uL Final  . Monocytes Relative 10/22/2017 6  % Final  . Monocytes Absolute 10/22/2017 0.7  0.1 - 0.9 K/uL Final  . Eosinophils Relative 10/22/2017 2  % Final  . Eosinophils Absolute 10/22/2017 0.2  0.0 - 0.5 K/uL Final  . Basophils Relative 10/22/2017 3  % Final  . Basophils Absolute 10/22/2017 0.3* 0.0 - 0.1 K/uL Final   Performed at [facility], [place] [address], [place], [place] [address]  . Sodium 10/22/2017 139  136 - 145 mmol/L Final  . Potassium 10/22/2017 3.8  3.5 - 5.1 mmol/L Final  . Chloride 10/22/2017 108  98 - 109 mmol/L Final  . CO2 10/22/2017 23  22 - 29 mmol/L Final  . Glucose, Bld 10/22/2017 96  70 - 140 mg/dL Final  . BUN 10/22/2017 11  7 - 26 mg/dL Final  . Creatinine, Ser 10/22/2017 0.74  0.60 - 1.10 mg/dL Final  . Calcium 10/22/2017 9.2  8.4 - 10.4 mg/dL Final  . Total Protein 10/22/2017 7.0  6.4 - 8.3 g/dL Final  . Albumin 10/22/2017 3.5  3.5 - 5.0 g/dL Final  . AST 10/22/2017 15  5 - 34 U/L Final  . ALT 10/22/2017 11  0 - 55 U/L Final  . Alkaline Phosphatase 10/22/2017 56  40 - 150 U/L Final  . Total Bilirubin 10/22/2017 0.3  0.2 - 1.2 mg/dL Final  . GFR calc non Af Amer 10/22/2017 >60  >60 mL/min Final  . GFR calc Af Amer 10/22/2017 >60  >60 mL/min Final   Comment: (NOTE) The eGFR has been calculated using the CKD EPI equation. This calculation has not been validated in all clinical situations. eGFR's persistently <60 mL/min signify possible Chronic Kidney Disease.   Georgiann Hahn gap 10/22/2017 8  3 - 11 Final   Performed at [facility], [place] [address] [place], [place] [address]  . Ferritin 10/22/2017 15  9 - 269 ng/mL Final   Performed at [facility], [place] [address] [place], [place] [address]  . Iron  10/22/2017 28* 41 - 142 ug/dL Final  . TIBC 10/22/2017 388  236 - 444 ug/dL Final  . Saturation Ratios 10/22/2017 7* 21 - 57 % Final  . UIBC 10/22/2017 359  ug/dL Final   Performed at [facility], [place] [address], [place], [place] [address]       Ardath Sax, MD

## 2017-11-21 ENCOUNTER — Inpatient Hospital Stay: Payer: BLUE CROSS/BLUE SHIELD | Attending: Hematology and Oncology

## 2017-11-21 VITALS — BP 145/84 | HR 71 | Temp 98.6°F | Resp 16

## 2017-11-21 DIAGNOSIS — D509 Iron deficiency anemia, unspecified: Secondary | ICD-10-CM | POA: Diagnosis not present

## 2017-11-21 DIAGNOSIS — D508 Other iron deficiency anemias: Secondary | ICD-10-CM

## 2017-11-21 MED ORDER — SODIUM CHLORIDE 0.9 % IV SOLN
125.0000 mg | Freq: Once | INTRAVENOUS | Status: AC
  Administered 2017-11-21: 125 mg via INTRAVENOUS
  Filled 2017-11-21: qty 10

## 2017-11-21 MED ORDER — ACETAMINOPHEN 325 MG PO TABS
ORAL_TABLET | ORAL | Status: AC
  Filled 2017-11-21: qty 2

## 2017-11-21 MED ORDER — SODIUM CHLORIDE 0.9 % IV SOLN
Freq: Once | INTRAVENOUS | Status: AC
  Administered 2017-11-21: 08:00:00 via INTRAVENOUS

## 2017-11-21 MED ORDER — ACETAMINOPHEN 325 MG PO TABS
ORAL_TABLET | ORAL | Status: AC
  Filled 2017-11-21: qty 1

## 2017-11-21 MED ORDER — ACETAMINOPHEN 325 MG PO TABS
650.0000 mg | ORAL_TABLET | Freq: Once | ORAL | Status: AC
  Administered 2017-11-21: 650 mg via ORAL

## 2017-11-21 NOTE — Progress Notes (Signed)
Patient reports generalized body aches "for a week since my last infusion". Pt states "its hard to get out of bed in the morning". Spoke with Dr. Lebron Conners and instructed to give Tylenol pre-meds and pt to take Tylenol 1000mg  TID at home to see if symptoms improve. Pt voiced understanding.

## 2017-11-21 NOTE — Patient Instructions (Signed)
Sodium Ferric Gluconate Complex injection What is this medicine? SODIUM FERRIC GLUCONATE COMPLEX (SOE dee um FER ik GLOO koe nate KOM pleks) is an iron replacement. It is used with epoetin therapy to treat low iron levels in patients who are receiving hemodialysis. This medicine may be used for other purposes; ask your health care provider or pharmacist if you have questions. COMMON BRAND NAME(S): Ferrlecit, Nulecit What should I tell my health care provider before I take this medicine? They need to know if you have any of the following conditions: -anemia that is not from iron deficiency -high levels of iron in the body -an unusual or allergic reaction to iron, benzyl alcohol, other medicines, foods, dyes, or preservatives -pregnant or are trying to become pregnant -breast-feeding How should I use this medicine? This medicine is for infusion into a vein. It is given by a health care professional in a hospital or clinic setting. Talk to your pediatrician regarding the use of this medicine in children. While this drug may be prescribed for children as young as 6 years old for selected conditions, precautions do apply. Overdosage: If you think you have taken too much of this medicine contact a poison control center or emergency room at once. NOTE: This medicine is only for you. Do not share this medicine with others. What if I miss a dose? It is important not to miss your dose. Call your doctor or health care professional if you are unable to keep an appointment. What may interact with this medicine? Do not take this medicine with any of the following medications: -deferoxamine -dimercaprol -other iron products This medicine may also interact with the following medications: -chloramphenicol -deferasirox -medicine for blood pressure like enalapril This list may not describe all possible interactions. Give your health care provider a list of all the medicines, herbs, non-prescription drugs,  or dietary supplements you use. Also tell them if you smoke, drink alcohol, or use illegal drugs. Some items may interact with your medicine. What should I watch for while using this medicine? Your condition will be monitored carefully while you are receiving this medicine. Visit your doctor for check-ups as directed. What side effects may I notice from receiving this medicine? Side effects that you should report to your doctor or health care professional as soon as possible: -allergic reactions like skin rash, itching or hives, swelling of the face, lips, or tongue -breathing problems -changes in hearing -changes in vision -chills, flushing, or sweating -fast, irregular heartbeat -feeling faint or lightheaded, falls -fever, flu-like symptoms -high or low blood pressure -pain, tingling, numbness in the hands or feet -severe pain in the chest, back, flanks, or groin -swelling of the ankles, feet, hands -trouble passing urine or change in the amount of urine -unusually weak or tired Side effects that usually do not require medical attention (report to your doctor or health care professional if they continue or are bothersome): -cramps -dark colored stools -diarrhea -headache -nausea, vomiting -stomach upset This list may not describe all possible side effects. Call your doctor for medical advice about side effects. You may report side effects to FDA at 1-800-FDA-1088. Where should I keep my medicine? This drug is given in a hospital or clinic and will not be stored at home. NOTE: This sheet is a summary. It may not cover all possible information. If you have questions about this medicine, talk to your doctor, pharmacist, or health care provider.  2018 Elsevier/Gold Standard (2008-05-05 15:58:57)  

## 2017-11-28 ENCOUNTER — Inpatient Hospital Stay: Payer: BLUE CROSS/BLUE SHIELD

## 2017-11-28 VITALS — BP 151/91 | HR 61 | Temp 98.2°F | Resp 16

## 2017-11-28 DIAGNOSIS — D508 Other iron deficiency anemias: Secondary | ICD-10-CM

## 2017-11-28 DIAGNOSIS — D509 Iron deficiency anemia, unspecified: Secondary | ICD-10-CM | POA: Diagnosis not present

## 2017-11-28 MED ORDER — SODIUM CHLORIDE 0.9 % IV SOLN
Freq: Once | INTRAVENOUS | Status: AC
  Administered 2017-11-28: 08:00:00 via INTRAVENOUS

## 2017-11-28 MED ORDER — NA FERRIC GLUC CPLX IN SUCROSE 12.5 MG/ML IV SOLN
125.0000 mg | Freq: Once | INTRAVENOUS | Status: AC
  Administered 2017-11-28: 125 mg via INTRAVENOUS
  Filled 2017-11-28: qty 10

## 2017-11-28 NOTE — Patient Instructions (Signed)
  Sodium Ferric Gluconate Complex injection (Nulecit) What is this medicine? SODIUM FERRIC GLUCONATE COMPLEX (SOE dee um FER ik GLOO koe nate KOM pleks) is an iron replacement. It is used with epoetin therapy to treat low iron levels in patients who are receiving hemodialysis. This medicine may be used for other purposes; ask your health care provider or pharmacist if you have questions. COMMON BRAND NAME(S): Ferrlecit, Nulecit What should I tell my health care provider before I take this medicine? They need to know if you have any of the following conditions: -anemia that is not from iron deficiency -high levels of iron in the body -an unusual or allergic reaction to iron, benzyl alcohol, other medicines, foods, dyes, or preservatives -pregnant or are trying to become pregnant -breast-feeding How should I use this medicine? This medicine is for infusion into a vein. It is given by a health care professional in a hospital or clinic setting. Talk to your pediatrician regarding the use of this medicine in children. While this drug may be prescribed for children as young as 44 years old for selected conditions, precautions do apply. Overdosage: If you think you have taken too much of this medicine contact a poison control center or emergency room at once. NOTE: This medicine is only for you. Do not share this medicine with others. What if I miss a dose? It is important not to miss your dose. Call your doctor or health care professional if you are unable to keep an appointment. What may interact with this medicine? Do not take this medicine with any of the following medications: -deferoxamine -dimercaprol -other iron products This medicine may also interact with the following medications: -chloramphenicol -deferasirox -medicine for blood pressure like enalapril This list may not describe all possible interactions. Give your health care provider a list of all the medicines, herbs,  non-prescription drugs, or dietary supplements you use. Also tell them if you smoke, drink alcohol, or use illegal drugs. Some items may interact with your medicine. What should I watch for while using this medicine? Your condition will be monitored carefully while you are receiving this medicine. Visit your doctor for check-ups as directed. What side effects may I notice from receiving this medicine? Side effects that you should report to your doctor or health care professional as soon as possible: -allergic reactions like skin rash, itching or hives, swelling of the face, lips, or tongue -breathing problems -changes in hearing -changes in vision -chills, flushing, or sweating -fast, irregular heartbeat -feeling faint or lightheaded, falls -fever, flu-like symptoms -high or low blood pressure -pain, tingling, numbness in the hands or feet -severe pain in the chest, back, flanks, or groin -swelling of the ankles, feet, hands -trouble passing urine or change in the amount of urine -unusually weak or tired Side effects that usually do not require medical attention (report to your doctor or health care professional if they continue or are bothersome): -cramps -dark colored stools -diarrhea -headache -nausea, vomiting -stomach upset This list may not describe all possible side effects. Call your doctor for medical advice about side effects. You may report side effects to FDA at 1-800-FDA-1088. Where should I keep my medicine? This drug is given in a hospital or clinic and will not be stored at home. NOTE: This sheet is a summary. It may not cover all possible information. If you have questions about this medicine, talk to your doctor, pharmacist, or health care provider.  2018 Elsevier/Gold Standard (2008-05-05 15:58:57)

## 2017-11-28 NOTE — Progress Notes (Signed)
Pt took 1,000 mg of Tylenol this am @0715 .

## 2017-12-05 ENCOUNTER — Inpatient Hospital Stay: Payer: BLUE CROSS/BLUE SHIELD

## 2017-12-05 ENCOUNTER — Other Ambulatory Visit: Payer: Self-pay | Admitting: Hematology and Oncology

## 2017-12-05 VITALS — BP 137/79 | HR 86 | Temp 98.1°F | Resp 17

## 2017-12-05 DIAGNOSIS — D508 Other iron deficiency anemias: Secondary | ICD-10-CM

## 2017-12-05 DIAGNOSIS — D509 Iron deficiency anemia, unspecified: Secondary | ICD-10-CM | POA: Diagnosis not present

## 2017-12-05 MED ORDER — SODIUM CHLORIDE 0.9 % IV SOLN
125.0000 mg | Freq: Once | INTRAVENOUS | Status: AC
  Administered 2017-12-05: 125 mg via INTRAVENOUS
  Filled 2017-12-05: qty 10

## 2017-12-05 MED ORDER — SODIUM CHLORIDE 0.9 % IV SOLN
Freq: Once | INTRAVENOUS | Status: AC
  Administered 2017-12-05: 09:00:00 via INTRAVENOUS

## 2017-12-05 NOTE — Patient Instructions (Signed)
Sodium Ferric Gluconate Complex injection What is this medicine? SODIUM FERRIC GLUCONATE COMPLEX (SOE dee um FER ik GLOO koe nate KOM pleks) is an iron replacement. It is used with epoetin therapy to treat low iron levels in patients who are receiving hemodialysis. This medicine may be used for other purposes; ask your health care provider or pharmacist if you have questions. COMMON BRAND NAME(S): Ferrlecit, Nulecit What should I tell my health care provider before I take this medicine? They need to know if you have any of the following conditions: -anemia that is not from iron deficiency -high levels of iron in the body -an unusual or allergic reaction to iron, benzyl alcohol, other medicines, foods, dyes, or preservatives -pregnant or are trying to become pregnant -breast-feeding How should I use this medicine? This medicine is for infusion into a vein. It is given by a health care professional in a hospital or clinic setting. Talk to your pediatrician regarding the use of this medicine in children. While this drug may be prescribed for children as young as 6 years old for selected conditions, precautions do apply. Overdosage: If you think you have taken too much of this medicine contact a poison control center or emergency room at once. NOTE: This medicine is only for you. Do not share this medicine with others. What if I miss a dose? It is important not to miss your dose. Call your doctor or health care professional if you are unable to keep an appointment. What may interact with this medicine? Do not take this medicine with any of the following medications: -deferoxamine -dimercaprol -other iron products This medicine may also interact with the following medications: -chloramphenicol -deferasirox -medicine for blood pressure like enalapril This list may not describe all possible interactions. Give your health care provider a list of all the medicines, herbs, non-prescription drugs,  or dietary supplements you use. Also tell them if you smoke, drink alcohol, or use illegal drugs. Some items may interact with your medicine. What should I watch for while using this medicine? Your condition will be monitored carefully while you are receiving this medicine. Visit your doctor for check-ups as directed. What side effects may I notice from receiving this medicine? Side effects that you should report to your doctor or health care professional as soon as possible: -allergic reactions like skin rash, itching or hives, swelling of the face, lips, or tongue -breathing problems -changes in hearing -changes in vision -chills, flushing, or sweating -fast, irregular heartbeat -feeling faint or lightheaded, falls -fever, flu-like symptoms -high or low blood pressure -pain, tingling, numbness in the hands or feet -severe pain in the chest, back, flanks, or groin -swelling of the ankles, feet, hands -trouble passing urine or change in the amount of urine -unusually weak or tired Side effects that usually do not require medical attention (report to your doctor or health care professional if they continue or are bothersome): -cramps -dark colored stools -diarrhea -headache -nausea, vomiting -stomach upset This list may not describe all possible side effects. Call your doctor for medical advice about side effects. You may report side effects to FDA at 1-800-FDA-1088. Where should I keep my medicine? This drug is given in a hospital or clinic and will not be stored at home. NOTE: This sheet is a summary. It may not cover all possible information. If you have questions about this medicine, talk to your doctor, pharmacist, or health care provider.  2018 Elsevier/Gold Standard (2008-05-05 15:58:57)  

## 2017-12-12 ENCOUNTER — Inpatient Hospital Stay: Payer: BLUE CROSS/BLUE SHIELD

## 2017-12-12 VITALS — BP 148/91 | HR 71 | Temp 98.3°F | Resp 18

## 2017-12-12 DIAGNOSIS — D509 Iron deficiency anemia, unspecified: Secondary | ICD-10-CM | POA: Diagnosis not present

## 2017-12-12 DIAGNOSIS — D508 Other iron deficiency anemias: Secondary | ICD-10-CM

## 2017-12-12 MED ORDER — SODIUM CHLORIDE 0.9 % IV SOLN
Freq: Once | INTRAVENOUS | Status: AC
  Administered 2017-12-12: 08:00:00 via INTRAVENOUS

## 2017-12-12 MED ORDER — SODIUM CHLORIDE 0.9 % IV SOLN
125.0000 mg | Freq: Once | INTRAVENOUS | Status: AC
  Administered 2017-12-12: 125 mg via INTRAVENOUS
  Filled 2017-12-12: qty 10

## 2017-12-12 NOTE — Patient Instructions (Signed)
Sodium Ferric Gluconate Complex injection What is this medicine? SODIUM FERRIC GLUCONATE COMPLEX (SOE dee um FER ik GLOO koe nate KOM pleks) is an iron replacement. It is used with epoetin therapy to treat low iron levels in patients who are receiving hemodialysis. This medicine may be used for other purposes; ask your health care provider or pharmacist if you have questions. COMMON BRAND NAME(S): Ferrlecit, Nulecit What should I tell my health care provider before I take this medicine? They need to know if you have any of the following conditions: -anemia that is not from iron deficiency -high levels of iron in the body -an unusual or allergic reaction to iron, benzyl alcohol, other medicines, foods, dyes, or preservatives -pregnant or are trying to become pregnant -breast-feeding How should I use this medicine? This medicine is for infusion into a vein. It is given by a health care professional in a hospital or clinic setting. Talk to your pediatrician regarding the use of this medicine in children. While this drug may be prescribed for children as Kensli Bowley as 6 years old for selected conditions, precautions do apply. Overdosage: If you think you have taken too much of this medicine contact a poison control center or emergency room at once. NOTE: This medicine is only for you. Do not share this medicine with others. What if I miss a dose? It is important not to miss your dose. Call your doctor or health care professional if you are unable to keep an appointment. What may interact with this medicine? Do not take this medicine with any of the following medications: -deferoxamine -dimercaprol -other iron products This medicine may also interact with the following medications: -chloramphenicol -deferasirox -medicine for blood pressure like enalapril This list may not describe all possible interactions. Give your health care provider a list of all the medicines, herbs, non-prescription drugs,  or dietary supplements you use. Also tell them if you smoke, drink alcohol, or use illegal drugs. Some items may interact with your medicine. What should I watch for while using this medicine? Your condition will be monitored carefully while you are receiving this medicine. Visit your doctor for check-ups as directed. What side effects may I notice from receiving this medicine? Side effects that you should report to your doctor or health care professional as soon as possible: -allergic reactions like skin rash, itching or hives, swelling of the face, lips, or tongue -breathing problems -changes in hearing -changes in vision -chills, flushing, or sweating -fast, irregular heartbeat -feeling faint or lightheaded, falls -fever, flu-like symptoms -high or low blood pressure -pain, tingling, numbness in the hands or feet -severe pain in the chest, back, flanks, or groin -swelling of the ankles, feet, hands -trouble passing urine or change in the amount of urine -unusually weak or tired Side effects that usually do not require medical attention (report to your doctor or health care professional if they continue or are bothersome): -cramps -dark colored stools -diarrhea -headache -nausea, vomiting -stomach upset This list may not describe all possible side effects. Call your doctor for medical advice about side effects. You may report side effects to FDA at 1-800-FDA-1088. Where should I keep my medicine? This drug is given in a hospital or clinic and will not be stored at home. NOTE: This sheet is a summary. It may not cover all possible information. If you have questions about this medicine, talk to your doctor, pharmacist, or health care provider.  2018 Elsevier/Gold Standard (2008-05-05 15:58:57)  

## 2017-12-23 ENCOUNTER — Ambulatory Visit: Payer: BLUE CROSS/BLUE SHIELD

## 2017-12-24 ENCOUNTER — Inpatient Hospital Stay: Payer: BLUE CROSS/BLUE SHIELD | Attending: Hematology and Oncology

## 2017-12-24 VITALS — BP 144/83 | HR 74 | Temp 98.2°F | Resp 17

## 2017-12-24 DIAGNOSIS — D509 Iron deficiency anemia, unspecified: Secondary | ICD-10-CM | POA: Diagnosis not present

## 2017-12-24 DIAGNOSIS — D508 Other iron deficiency anemias: Secondary | ICD-10-CM

## 2017-12-24 MED ORDER — SODIUM CHLORIDE 0.9 % IV SOLN
Freq: Once | INTRAVENOUS | Status: AC
  Administered 2017-12-24: 10:00:00 via INTRAVENOUS

## 2017-12-24 MED ORDER — SODIUM CHLORIDE 0.9 % IV SOLN
125.0000 mg | Freq: Once | INTRAVENOUS | Status: AC
  Administered 2017-12-24: 125 mg via INTRAVENOUS
  Filled 2017-12-24: qty 10

## 2017-12-24 NOTE — Patient Instructions (Signed)
Sodium Ferric Gluconate Complex injection What is this medicine? SODIUM FERRIC GLUCONATE COMPLEX (SOE dee um FER ik GLOO koe nate KOM pleks) is an iron replacement. It is used with epoetin therapy to treat low iron levels in patients who are receiving hemodialysis. This medicine may be used for other purposes; ask your health care provider or pharmacist if you have questions. COMMON BRAND NAME(S): Ferrlecit, Nulecit What should I tell my health care provider before I take this medicine? They need to know if you have any of the following conditions: -anemia that is not from iron deficiency -high levels of iron in the body -an unusual or allergic reaction to iron, benzyl alcohol, other medicines, foods, dyes, or preservatives -pregnant or are trying to become pregnant -breast-feeding How should I use this medicine? This medicine is for infusion into a vein. It is given by a health care professional in a hospital or clinic setting. Talk to your pediatrician regarding the use of this medicine in children. While this drug may be prescribed for children as young as 6 years old for selected conditions, precautions do apply. Overdosage: If you think you have taken too much of this medicine contact a poison control center or emergency room at once. NOTE: This medicine is only for you. Do not share this medicine with others. What if I miss a dose? It is important not to miss your dose. Call your doctor or health care professional if you are unable to keep an appointment. What may interact with this medicine? Do not take this medicine with any of the following medications: -deferoxamine -dimercaprol -other iron products This medicine may also interact with the following medications: -chloramphenicol -deferasirox -medicine for blood pressure like enalapril This list may not describe all possible interactions. Give your health care provider a list of all the medicines, herbs, non-prescription drugs,  or dietary supplements you use. Also tell them if you smoke, drink alcohol, or use illegal drugs. Some items may interact with your medicine. What should I watch for while using this medicine? Your condition will be monitored carefully while you are receiving this medicine. Visit your doctor for check-ups as directed. What side effects may I notice from receiving this medicine? Side effects that you should report to your doctor or health care professional as soon as possible: -allergic reactions like skin rash, itching or hives, swelling of the face, lips, or tongue -breathing problems -changes in hearing -changes in vision -chills, flushing, or sweating -fast, irregular heartbeat -feeling faint or lightheaded, falls -fever, flu-like symptoms -high or low blood pressure -pain, tingling, numbness in the hands or feet -severe pain in the chest, back, flanks, or groin -swelling of the ankles, feet, hands -trouble passing urine or change in the amount of urine -unusually weak or tired Side effects that usually do not require medical attention (report to your doctor or health care professional if they continue or are bothersome): -cramps -dark colored stools -diarrhea -headache -nausea, vomiting -stomach upset This list may not describe all possible side effects. Call your doctor for medical advice about side effects. You may report side effects to FDA at 1-800-FDA-1088. Where should I keep my medicine? This drug is given in a hospital or clinic and will not be stored at home. NOTE: This sheet is a summary. It may not cover all possible information. If you have questions about this medicine, talk to your doctor, pharmacist, or health care provider.  2018 Elsevier/Gold Standard (2008-05-05 15:58:57)  

## 2018-01-15 ENCOUNTER — Emergency Department (HOSPITAL_COMMUNITY)
Admission: EM | Admit: 2018-01-15 | Discharge: 2018-01-15 | Disposition: A | Discharge status: Home / Self Care | Payer: BLUE CROSS/BLUE SHIELD | Attending: Emergency Medicine | Admitting: Emergency Medicine

## 2018-01-15 DIAGNOSIS — J069 Acute upper respiratory infection, unspecified: Secondary | ICD-10-CM | POA: Insufficient documentation

## 2018-01-15 DIAGNOSIS — J029 Acute pharyngitis, unspecified: Secondary | ICD-10-CM | POA: Diagnosis not present

## 2018-01-15 DIAGNOSIS — I10 Essential (primary) hypertension: Secondary | ICD-10-CM | POA: Diagnosis not present

## 2018-01-15 DIAGNOSIS — Z79899 Other long term (current) drug therapy: Secondary | ICD-10-CM | POA: Diagnosis not present

## 2018-01-15 DIAGNOSIS — B9789 Other viral agents as the cause of diseases classified elsewhere: Secondary | ICD-10-CM

## 2018-01-15 DIAGNOSIS — B349 Viral infection, unspecified: Secondary | ICD-10-CM | POA: Insufficient documentation

## 2018-01-15 DIAGNOSIS — F17210 Nicotine dependence, cigarettes, uncomplicated: Secondary | ICD-10-CM | POA: Insufficient documentation

## 2018-01-15 DIAGNOSIS — R05 Cough: Secondary | ICD-10-CM | POA: Diagnosis not present

## 2018-01-15 MED ORDER — IBUPROFEN 600 MG PO TABS: 600.0000 mg | ORAL_TABLET | Freq: Four times a day (QID) | ORAL | 0 refills | Status: DC | PRN

## 2018-01-15 MED ORDER — PSEUDOEPHEDRINE HCL ER 120 MG PO TB12: 120.0000 mg | ORAL_TABLET | Freq: Two times a day (BID) | ORAL | 0 refills | Status: DC

## 2018-01-15 NOTE — ED Triage Notes (Signed)
Pt complains of fever, body aches, sore throat, cough, nasal congestion, headache x 3 days. Pt noticed a bump on her left eyelid 2 days ago that has gotten bigger since.

## 2018-01-15 NOTE — Discharge Instructions (Signed)
Take the medicine prescribed for congestion. You may take Tylenol PM with this medication before sleep. Take ibuprofen or Tylenol for body aches.

## 2018-01-15 NOTE — ED Provider Notes (Signed)
[facility] DEPT Provider Note   CSN: [id number] Arrival date & time: 01/15/18  5638     History   Chief Complaint Chief Complaint  Patient presents with  . Generalized Body Aches  . Cough    HPI Jill Davidson is a 47 y.o. female.  HPI  47 year old female comes in with chief complaint of body aches and cough.  Patient has history of iron deficiency anemia, allergic rhinitis and she reports that over the past 4 days she has been having sore throat with associated congestion, body aches.  Patient denies any fevers or chills.  Patient has had multiple sick contacts both at work and in her family.  Past Medical History:  Diagnosis Date  . AR (allergic rhinitis)   . Bacterial vaginosis   . Complication of anesthesia 2008   PT STATES THAT DURING C-SECTION SHE WAS ADMINISTERED A GAS THAT CAUSED HER HEART TO BEAT IRREGULAR- PT HAS HAD NO PROBLEMS WITH THIS SINCE 2008  . Dysrhythmia    DUE TO A GAS THAT WAS ADMINISTERED DURING A 2008 C-SECTION-NO PROBLEMS SINCE  . Fibroid   . GERD (gastroesophageal reflux disease)    OCC  . IDA (iron deficiency anemia) 01/25/2015  . UTI (lower urinary tract infection)     Patient Active Problem List   Diagnosis Date Noted  . Obesity (BMI 30.0-34.9) 07/25/2016  . Hypertension 07/25/2016  . Status post endometrial ablation 07/23/2016  . Fibroid, uterine 11/15/2015  . Abnormal uterine bleeding 11/15/2015  . IDA (iron deficiency anemia) 01/25/2015    Past Surgical History:  Procedure Laterality Date  . CESAREAN SECTION     X2  . DILATION AND CURETTAGE OF UTERUS    . DILITATION & CURRETTAGE/HYSTROSCOPY WITH NOVASURE ABLATION N/A 07/23/2016   Procedure: DILATATION & CURETTAGE/HYSTEROSCOPY WITH NOVASURE ABLATION;  Surgeon: Brayton Mars, MD;  Location: [facility] ORS;  Service: Gynecology;  Laterality: N/A;     OB History    Gravida  6   Para  4   Term  4   Preterm      AB  2   Living  4     SAB  2    TAB      Ectopic      Multiple      Live Births  4            Home Medications    Prior to Admission medications   Medication Sig Start Date End Date Taking? Authorizing Provider  acetaminophen (TYLENOL) 325 MG tablet Take 650 mg by mouth every 6 (six) hours as needed for moderate pain.    Yes [provider]  fluticasone (FLONASE) 50 MCG/ACT nasal spray Place 2 sprays into both nostrils daily. 06/03/17  Yes Sowles, Drue Stager, MD  losartan (COZAAR) 25 MG tablet Take 25 mg by mouth daily. 03/12/17  Yes Kolluru, Lurena Nida, MD  montelukast (SINGULAIR) 10 MG tablet Take 1 tablet (10 mg total) by mouth daily as needed (for allergies.). 06/03/17  Yes Sowles, Drue Stager, MD  Vitamin D, Ergocalciferol, (DRISDOL) 50000 units CAPS capsule Take 1 capsule by mouth every 30 (thirty) days. 11/25/17  Yes [provider]  ibuprofen (ADVIL,MOTRIN) 600 MG tablet Take 1 tablet (600 mg total) by mouth every 6 (six) hours as needed. 01/15/18   Varney Biles, MD  pseudoephedrine (SUDAFED 12 HOUR) 120 MG 12 hr tablet Take 1 tablet (120 mg total) by mouth 2 (two) times daily. 01/15/18   Varney Biles, MD  Family History Family History  Problem Relation Age of Onset  . Diabetes Father   . Hypertension Father   . Colon cancer Maternal Aunt   . Breast cancer Maternal Aunt        great  . Heart disease Maternal Aunt   . Diabetes Maternal Grandmother   . Hypertension Mother   . Thyroid disease Mother   . Lung cancer Mother   . Asthma Mother   . Hypertension Brother   . Ovarian cancer Neg Hx     Social History Social History   Tobacco Use  . Smoking status: Current Every Day Smoker    Packs/day: 0.25    Years: 25.00    Pack years: 6.25    Types: Cigarettes    Start date: 07/03/1992  . Smokeless tobacco: Never Used  Substance Use Topics  . Alcohol use: Yes    Comment: every 2-3 weeks  . Drug use: Yes    Types: Marijuana    Comment: once a week     Allergies   Patient has  no known allergies.   Review of Systems Review of Systems  Constitutional: Positive for activity change.  HENT: Positive for congestion and sore throat. Negative for trouble swallowing and voice change.   Respiratory: Positive for cough. Negative for shortness of breath and wheezing.   Cardiovascular: Negative for chest pain.  Allergic/Immunologic: Negative for immunocompromised state.     Physical Exam Updated Vital Signs BP (!) 162/95 (BP Location: Right Arm)   Pulse (!) 57   Temp 97.6 F (36.4 C) (Oral)   Resp 18   SpO2 100%   Physical Exam  Constitutional: She is oriented to person, place, and time. She appears well-developed.  HENT:  Head: Normocephalic and atraumatic.  Mouth/Throat: No oropharyngeal exudate.  Left-sided tonsillar enlargement  Eyes: EOM are normal.  Neck: Normal range of motion. Neck supple.  Cardiovascular: Normal rate.  Pulmonary/Chest: Effort normal.  Lymphadenopathy:    She has cervical adenopathy.  Neurological: She is alert and oriented to person, place, and time.  Nursing note and vitals reviewed.    ED Treatments / Results  Labs (all labs ordered are listed, but only abnormal results are displayed) Labs Reviewed - No data to display  EKG None  Radiology No results found.  Procedures Procedures (including critical care time)  Medications Ordered in ED Medications - No data to display   Initial Impression / Assessment and Plan / ED Course  I have reviewed the triage vital signs and the nursing notes.  Pertinent labs & imaging results that were available during my care of the patient were reviewed by me and considered in my medical decision making (see chart for details).     47 year old female comes in with congestion, teary eyes, body aches, sore throat and she has had sick contacts.  Patient's symptoms could be due to allergies, but there could also be a combination of both allergies and a common cold.  Flu considered in  the differential diagnosis, however it is unlikely given the decline in the number of flu like illness visits we have seen.  Regardless patient symptoms started more than 4 days ago and she is not immunocompromised or immunosuppressed, therefore the flu status would not change our work-up.  We will treat the symptoms.  Final Clinical Impressions(s) / ED Diagnoses   Final diagnoses:  Viral syndrome  Viral URI with cough  Pharyngitis, unspecified etiology    ED Discharge Orders  Ordered    pseudoephedrine (SUDAFED 12 HOUR) 120 MG 12 hr tablet  2 times daily     01/15/18 0809    ibuprofen (ADVIL,MOTRIN) 600 MG tablet  Every 6 hours PRN     01/15/18 0810       Varney Biles, MD 01/15/18 [phone]

## 2018-01-16 ENCOUNTER — Inpatient Hospital Stay: Payer: BLUE CROSS/BLUE SHIELD | Attending: Hematology and Oncology

## 2018-01-16 ENCOUNTER — Inpatient Hospital Stay (HOSPITAL_BASED_OUTPATIENT_CLINIC_OR_DEPARTMENT_OTHER): Payer: BLUE CROSS/BLUE SHIELD | Admitting: Medical

## 2018-01-16 VITALS — BP 147/92 | HR 60 | Temp 98.2°F | Resp 17 | Ht 64.0 in | Wt 220.5 lb

## 2018-01-16 DIAGNOSIS — D509 Iron deficiency anemia, unspecified: Secondary | ICD-10-CM | POA: Insufficient documentation

## 2018-01-16 DIAGNOSIS — E669 Obesity, unspecified: Secondary | ICD-10-CM | POA: Insufficient documentation

## 2018-01-16 DIAGNOSIS — R5383 Other fatigue: Secondary | ICD-10-CM

## 2018-01-16 DIAGNOSIS — J0101 Acute recurrent maxillary sinusitis: Secondary | ICD-10-CM | POA: Insufficient documentation

## 2018-01-16 DIAGNOSIS — Z720 Tobacco use: Secondary | ICD-10-CM | POA: Insufficient documentation

## 2018-01-16 DIAGNOSIS — I10 Essential (primary) hypertension: Secondary | ICD-10-CM | POA: Diagnosis not present

## 2018-01-16 DIAGNOSIS — D508 Other iron deficiency anemias: Secondary | ICD-10-CM

## 2018-01-16 LAB — CMP (CANCER CENTER ONLY)
ALK PHOS: 50 U/L (ref 40–150)
ALT: 19 U/L (ref 0–55)
ANION GAP: 7 (ref 3–11)
AST: 18 U/L (ref 5–34)
Albumin: 3.9 g/dL (ref 3.5–5.0)
BUN: 11 mg/dL (ref 7–26)
CALCIUM: 9.1 mg/dL (ref 8.4–10.4)
CHLORIDE: 108 mmol/L (ref 98–109)
CO2: 22 mmol/L (ref 22–29)
Creatinine: 0.72 mg/dL (ref 0.60–1.10)
GFR, Estimated: >60 mL/min (ref >60)
GLUCOSE: 91 mg/dL (ref 70–140)
Potassium: 3.9 mmol/L (ref 3.5–5.1)
SODIUM: 137 mmol/L (ref 136–145)
Total Bilirubin: 0.3 mg/dL (ref 0.2–1.2)
Total Protein: 7.3 g/dL (ref 6.4–8.3)

## 2018-01-16 LAB — CBC WITH DIFFERENTIAL (CANCER CENTER ONLY)
BASOS PCT: 1 %
Basophils Absolute: 0.1 10*3/uL (ref 0.0–0.1)
EOS ABS: 0.1 10*3/uL (ref 0.0–0.5)
Eosinophils Relative: 1 %
HCT: 42.5 % (ref 34.8–46.6)
HEMOGLOBIN: 14.5 g/dL (ref 11.6–15.9)
Lymphocytes Relative: 38 %
Lymphs Abs: 3.2 10*3/uL (ref 0.9–3.3)
MCH: 31.1 pg (ref 25.1–34.0)
MCHC: 34.1 g/dL (ref 31.5–36.0)
MCV: 91.3 fL (ref 79.5–101.0)
Monocytes Absolute: 0.4 10*3/uL (ref 0.1–0.9)
Monocytes Relative: 4 %
NEUTROS PCT: 56 %
Neutro Abs: 4.7 10*3/uL (ref 1.5–6.5)
Platelet Count: 240 10*3/uL (ref 145–400)
RBC: 4.65 MIL/uL (ref 3.70–5.45)
RDW: 15.3 % — ABNORMAL HIGH (ref 11.2–14.5)
WBC: 8.4 10*3/uL (ref 3.9–10.3)

## 2018-01-16 LAB — IRON AND TIBC
Iron: 74 ug/dL (ref 41–142)
SATURATION RATIOS: 23 % (ref 21–57)
TIBC: 315 ug/dL (ref 236–444)
UIBC: 241 ug/dL

## 2018-01-16 LAB — FERRITIN: FERRITIN: 117 ng/mL (ref 9–269)

## 2018-01-16 MED ORDER — FLUCONAZOLE 150 MG PO TABS: ORAL_TABLET | ORAL | 0 refills | Status: DC

## 2018-01-16 MED ORDER — AMOXICILLIN-POT CLAVULANATE 875-125 MG PO TABS: 1.0000 | ORAL_TABLET | Freq: Two times a day (BID) | ORAL | 0 refills | Status: AC

## 2018-01-16 MED ORDER — PREDNISONE 5 MG PO TABS: ORAL_TABLET | ORAL | 0 refills | Status: DC

## 2018-01-16 NOTE — Progress Notes (Signed)
[facility] Progress Note   Jill Davidson [id number] 03-01-1971 47 y.o.  Jill Davidson is managed by Dr. Lebron Conners  Actively treated with chemotherapy: no  Assessment: Plan:    Acute recurrent maxillary sinusitis - Plan: amoxicillin-clavulanate (AUGMENTIN) 875-125 MG tablet, predniSONE (DELTASONE) 5 MG tablet, fluconazole (DIFLUCAN) 150 MG tablet   Acute recurrent maxillary sinusitis: Patient was given a prescription for Augmentin 875-125 p.o. twice daily x10 days along with a 6-day prednisone taper.  She was also given a prescription for Diflucan to use as needed.  I recommended to the patient that she begin Flonase 1 spray in each nostril twice daily and to begin either a probiotic or to eat yogurt daily while she is on Augmentin.  Please see After Visit Summary for patient specific instructions.  Future Appointments  Date Time Provider [facility]  01/21/2018  8:00 AM Ardath Sax, MD [facility] None  01/21/2018  8:45 AM [facility] I27 DNS [facility] None    No orders of the defined types were placed in this encounter.      Subjective:   Patient ID:  Jill Davidson is a 47 y.o. (DOB 22-Oct-1970) female.  Chief Complaint: No chief complaint on file.   HPI Jill Davidson is a 47 year old female who has been seen previously by Dr. Lebron Conners for iron deficiency anemia.  She was seen in the emergency room last evening for upper respiratory symptoms including chills, sweats, facial pressure, sinus pain, postnasal drainage, and a productive cough with yellowish sputum.  She denies shortness of breath, nausea, vomiting, or fevers.  She was told to take Motrin and Sudafed.  She states that she had been told by nephrology that she should not use Motrin or NSAIDs.  She is on losartan.  She reports that cardiology had told her previously that she should not take Sudafed as that it would increase her heart rate and blood pressure.  She missed work last evening due to  her symptoms.  Medications: I have reviewed the patient's current medications.  Allergies: No Known Allergies  Past Medical History:  Diagnosis Date  . AR (allergic rhinitis)   . Bacterial vaginosis   . Complication of anesthesia 2008   PT STATES THAT DURING C-SECTION SHE WAS ADMINISTERED A GAS THAT CAUSED HER HEART TO BEAT IRREGULAR- PT HAS HAD NO PROBLEMS WITH THIS SINCE 2008  . Dysrhythmia    DUE TO A GAS THAT WAS ADMINISTERED DURING A 2008 C-SECTION-NO PROBLEMS SINCE  . Fibroid   . GERD (gastroesophageal reflux disease)    OCC  . IDA (iron deficiency anemia) 01/25/2015  . UTI (lower urinary tract infection)     Past Surgical History:  Procedure Laterality Date  . CESAREAN SECTION     X2  . DILATION AND CURETTAGE OF UTERUS    . DILITATION & CURRETTAGE/HYSTROSCOPY WITH NOVASURE ABLATION N/A 07/23/2016   Procedure: DILATATION & CURETTAGE/HYSTEROSCOPY WITH NOVASURE ABLATION;  Surgeon: Brayton Mars, MD;  Location: [facility];  Service: Gynecology;  Laterality: N/A;    Family History  Problem Relation Age of Onset  . Diabetes Father   . Hypertension Father   . Colon cancer Maternal Aunt   . Breast cancer Maternal Aunt        great  . Heart disease Maternal Aunt   . Diabetes Maternal Grandmother   . Hypertension Mother   . Thyroid disease Mother   . Lung cancer Mother   . Asthma Mother   . Hypertension Brother   .  Ovarian cancer Neg Hx     Social History   Socioeconomic History  . Marital status: Single    Spouse name: Not on file  . Number of children: Not on file  . Years of education: Not on file  . Highest education level: Not on file  Occupational History  . Not on file  Social Needs  . Financial resource strain: Not on file  . Food insecurity:    Worry: Not on file    Inability: Not on file  . Transportation needs:    Medical: Not on file    Non-medical: Not on file  Tobacco Use  . Smoking status: Current Every Day Smoker    Packs/day: 0.25     Years: 25.00    Pack years: 6.25    Types: Cigarettes    Start date: 07/03/1992  . Smokeless tobacco: Never Used  Substance and Sexual Activity  . Alcohol use: Yes    Comment: every 2-3 weeks  . Drug use: Yes    Types: Marijuana    Comment: once a week  . Sexual activity: Yes    Birth control/protection: None    Comment: Ablation  Lifestyle  . Physical activity:    Days per week: Not on file    Minutes per session: Not on file  . Stress: Not on file  Relationships  . Social connections:    Talks on phone: Not on file    Gets together: Not on file    Attends religious service: Not on file    Active member of club or organization: Not on file    Attends meetings of clubs or organizations: Not on file    Relationship status: Not on file  . Intimate partner violence:    Fear of current or ex partner: Not on file    Emotionally abused: Not on file    Physically abused: Not on file    Forced sexual activity: Not on file  Other Topics Concern  . 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   Please see review of systems for further details on the patient's review from today.   Review of Systems:  Review of Systems  Constitutional: Negative for chills, diaphoresis and fever.  HENT: Positive for postnasal drip, sinus pressure and sinus pain. Negative for congestion, rhinorrhea, sneezing and sore throat.   Respiratory: Negative for cough and shortness of breath.   Neurological: Negative for headaches.    Objective:   Physical Exam:  BP (!) 147/92 (BP Location: Right Arm, Patient Position: Sitting) Comment: nurse aware of bp  Pulse 60   Temp 98.2 F (36.8 C) (Oral)   Resp 17   Ht 5\' 4"  (1.626 m)   Wt 220 lb 8 oz (100 kg)   SpO2  100%   BMI 37.85 kg/m  ECOG: 0  Physical Exam  Constitutional: No distress.  HENT:  Head: Normocephalic and atraumatic.  Nose: Right sinus exhibits maxillary sinus tenderness. Right sinus exhibits no frontal sinus tenderness. Left sinus exhibits maxillary sinus tenderness. Left sinus exhibits no frontal sinus tenderness.  Mouth/Throat: No oropharyngeal exudate.  Cardiovascular: Normal rate, regular rhythm and normal heart sounds. Exam reveals no gallop and no friction rub.  No murmur  heard. Pulmonary/Chest: Effort normal and breath sounds normal. No respiratory distress. She has no wheezes. She has no rales.  Neurological: She is alert.  Skin: Skin is warm and dry. No rash noted. She is not diaphoretic. No erythema.    Lab Review:     Component Value Date/Time   NA 137 01/16/2018 0840   NA 137 08/02/2017 1206   K 3.9 01/16/2018 0840   K 3.6 08/02/2017 1206   CL 108 01/16/2018 0840   CO2 22 01/16/2018 0840   CO2 23 08/02/2017 1206   GLUCOSE 91 01/16/2018 0840   GLUCOSE 86 08/02/2017 1206   BUN 11 01/16/2018 0840   BUN 8.8 08/02/2017 1206   CREATININE 0.72 01/16/2018 0840   CREATININE 0.7 08/02/2017 1206   CALCIUM 9.1 01/16/2018 0840   CALCIUM 8.9 08/02/2017 1206   PROT 7.3 01/16/2018 0840   PROT 7.2 08/02/2017 1206   ALBUMIN 3.9 01/16/2018 0840   ALBUMIN 3.5 08/02/2017 1206   AST 18 01/16/2018 0840   AST 16 08/02/2017 1206   ALT 19 01/16/2018 0840   ALT 13 08/02/2017 1206   ALKPHOS 50 01/16/2018 0840   ALKPHOS 52 08/02/2017 1206   BILITOT 0.3 01/16/2018 0840   BILITOT 0.53 08/02/2017 1206   GFRNONAA >60 01/16/2018 0840   GFRNONAA 106 06/03/2017 1408   GFRAA >60 01/16/2018 0840   GFRAA 123 06/03/2017 1408       Component Value Date/Time   WBC 8.4 01/16/2018 0840   WBC 11.0 (H) 10/22/2017 0825   RBC 4.65 01/16/2018 0840   HGB 14.5 01/16/2018 0840   HGB 11.5 (L) 08/02/2017 1205   HCT 42.5 01/16/2018 0840   HCT 35.8 08/02/2017 1205   PLT 240 01/16/2018 0840     PLT 369 08/02/2017 1205   PLT 454 (H) 08/15/2015 1047   MCV 91.3 01/16/2018 0840   MCV 83.4 08/02/2017 1205   MCH 31.1 01/16/2018 0840   MCHC 34.1 01/16/2018 0840   RDW 15.3 (H) 01/16/2018 0840   RDW 16.5 (H) 08/02/2017 1205   LYMPHSABS 3.2 01/16/2018 0840   LYMPHSABS 3.7 (H) 08/02/2017 1205   MONOABS 0.4 01/16/2018 0840   MONOABS 0.7 08/02/2017 1205   EOSABS 0.1 01/16/2018 0840   EOSABS 0.1 08/02/2017 1205   EOSABS 0.2 01/03/2015 1503   BASOSABS 0.1 01/16/2018 0840   BASOSABS 0.0 08/02/2017 1205   -------------------------------  Imaging from last 24 hours (if applicable):  Radiology interpretation: No results found.

## 2018-01-16 NOTE — Patient Instructions (Signed)
Return to Work Jill Davidson was treated at our facility. Please excuse her from work last evening as she was ill.  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 Your employer should be aware of your condition and can help with the necessary work activity restrictions. If you wish to return to work sooner than the date that is listed above, or if you have further problems that make it difficult for you to return at that time, please call our clinic or your health care provider.  Sandi Mealy, MHS, PA-C Health Care Provider Name (printed)  _________________________________________ Health Care Provider (signature)  _________________________________________ Date  This information is not intended to replace advice given to you by your health care provider. Make sure you discuss any questions you have with your health care provider. Document Released: 09/03/2005 Document Revised: 08/17/2016 Document Reviewed: 04/02/2014 Elsevier Interactive Patient Education  [employer].

## 2018-01-16 NOTE — Progress Notes (Signed)
Pt reports sinus pain, drainage, and feeling 'swollen' in her eyes, nose, and throat.  Recent ED visit.  Pt was given sudafed and ibuprofen neither of which the patient can take d/t other medications.  PA Lucianne Lei aware.  Afebrile.  A&Ox4.  Denies SOB or CP or trouble swallowing.  Minor redness without swelling in throat.

## 2018-01-21 ENCOUNTER — Encounter: Payer: Self-pay | Admitting: Hematology and Oncology

## 2018-01-21 ENCOUNTER — Inpatient Hospital Stay: Payer: BLUE CROSS/BLUE SHIELD

## 2018-01-21 ENCOUNTER — Inpatient Hospital Stay (HOSPITAL_BASED_OUTPATIENT_CLINIC_OR_DEPARTMENT_OTHER): Payer: BLUE CROSS/BLUE SHIELD | Admitting: Hematology and Oncology

## 2018-01-21 ENCOUNTER — Telehealth: Payer: Self-pay | Admitting: Hematology and Oncology

## 2018-01-21 VITALS — BP 154/98 | HR 67 | Temp 98.2°F | Resp 18 | Ht 64.0 in | Wt 204.0 lb

## 2018-01-21 DIAGNOSIS — E669 Obesity, unspecified: Secondary | ICD-10-CM

## 2018-01-21 DIAGNOSIS — J0101 Acute recurrent maxillary sinusitis: Secondary | ICD-10-CM | POA: Diagnosis not present

## 2018-01-21 DIAGNOSIS — D509 Iron deficiency anemia, unspecified: Secondary | ICD-10-CM | POA: Diagnosis not present

## 2018-01-21 DIAGNOSIS — I10 Essential (primary) hypertension: Secondary | ICD-10-CM

## 2018-01-21 DIAGNOSIS — D508 Other iron deficiency anemias: Secondary | ICD-10-CM

## 2018-01-21 DIAGNOSIS — Z720 Tobacco use: Secondary | ICD-10-CM

## 2018-01-21 NOTE — Telephone Encounter (Signed)
Scheduled appt per 5/7 los - Gave patient aVS and calender per los.

## 2018-01-23 DIAGNOSIS — D649 Anemia, unspecified: Secondary | ICD-10-CM | POA: Diagnosis not present

## 2018-01-23 DIAGNOSIS — R319 Hematuria, unspecified: Secondary | ICD-10-CM | POA: Diagnosis not present

## 2018-01-23 DIAGNOSIS — I10 Essential (primary) hypertension: Secondary | ICD-10-CM | POA: Diagnosis not present

## 2018-01-23 DIAGNOSIS — R809 Proteinuria, unspecified: Secondary | ICD-10-CM | POA: Diagnosis not present

## 2018-01-29 DIAGNOSIS — E559 Vitamin D deficiency, unspecified: Secondary | ICD-10-CM | POA: Diagnosis not present

## 2018-01-29 DIAGNOSIS — I10 Essential (primary) hypertension: Secondary | ICD-10-CM | POA: Diagnosis not present

## 2018-01-29 DIAGNOSIS — Z720 Tobacco use: Secondary | ICD-10-CM | POA: Diagnosis not present

## 2018-01-29 DIAGNOSIS — D649 Anemia, unspecified: Secondary | ICD-10-CM | POA: Diagnosis not present

## 2018-02-05 DIAGNOSIS — R319 Hematuria, unspecified: Secondary | ICD-10-CM | POA: Diagnosis not present

## 2018-02-05 DIAGNOSIS — R809 Proteinuria, unspecified: Secondary | ICD-10-CM | POA: Diagnosis not present

## 2018-02-05 DIAGNOSIS — D649 Anemia, unspecified: Secondary | ICD-10-CM | POA: Diagnosis not present

## 2018-02-05 DIAGNOSIS — I10 Essential (primary) hypertension: Secondary | ICD-10-CM | POA: Diagnosis not present

## 2018-02-10 NOTE — Progress Notes (Signed)
[facility] Follow-up Visit:  Assessment: No problem-specific Assessment & Plan notes found for this encounter.   Orders Placed This Encounter  Procedures  . CBC with Differential (Cancer Center Only)    Standing Status:   Standing    Number of Occurrences:   3    Standing Expiration Date:   01/22/2019  . CMP ([facility] only)    Standing Status:   Standing    Number of Occurrences:   3    Standing Expiration Date:   01/22/2019  . Iron and TIBC    Standing Status:   Standing    Number of Occurrences:   3    Standing Expiration Date:   01/22/2019  . Ferritin    Standing Status:   Standing    Number of Occurrences:   3    Standing Expiration Date:   01/22/2019    Cancer Staging No matching staging information was found for the patient.  All questions were answered.  . The patient knows to call the clinic with any problems, questions or concerns.  This note was electronically signed.    History of Presenting Illness Jill Davidson 47 y.o. presenting to the [facility] for iron deficiency anemia referred by Dr Steele Sizer.  Patient's past medical history significant for hypertension, obesity, and history of uterine fibroids with pathological bleeding.  Family history significant for maternal aunt with colon cancer, maternal great aunt with breast cancer, and mother with lung cancer.  No history of ovarian cancer in the family.  Patient has iron deficiency anem which she tolerated without complications.  In the interim, she is feeling a lot better with improving energy and improvement in the generalized body aches complained of previously.  Patient had a viral illness in early May and is recovering from it.  Oncological/hematological History: --Labs, 01/03/15: WBC   9.2, Hgb   9.7, MCV 82.0, MCH 25.1,    RDW 17.1, Plt 454; Fe 18, FeSat   4%, TIBC 475, Ferritin   4 --Labs, 08/26/15: WBC 11.5, Hgb   9.7, MCV 79.9, MCH 25.0,    RDW 17.0, Plt 385; Fe 13, FeSat    3%, TIBC 410, Ferritin   8 --Labs, 07/20/16: WBC 10.1, Hgb 11.8, MCV 79.9, MCH 27.7,    RDW 18.3, Plt 292; --Labs, 06/03/17: WBC 10.0, Hgb 11.4, MCV 83.4, MCH 26.6,    RDW 15.5, Plt 406; --Labs, 08/02/17: WBC   9.4, Hgb 11.5, MCV     ..., MCH     ...,   RDW     ..., Plt 369; Fe 82, FeSat 18%, TIBC 453, Ferritin 12, Folate 8.3, vitamin B12 509 --Treatment:  --Ferrlecit: x2 --Labs, 10/22/17: WBC 11.0, Hgb 12.4, MCV 84.8, MCH 27.6, MCHC 32.5, RDW 17.9, Plt 355; Fe 28, FeSat   7%, TIBC 388, Ferritin   15  --Ferrlecit: x6 --Labs, 01/16/18: WBC   8.4, Hgb 14.5, MCV 91.3, MCH 31.1, MCHC 34.1, RDW 15.3, Plt 340; Fe 74, FeSat 23%, TIBC 315, Ferritin 117;  Medical History: Past Medical History:  Diagnosis Date  . AR (allergic rhinitis)   . Bacterial vaginosis   . Complication of anesthesia 2008   PT STATES THAT DURING C-SECTION SHE WAS ADMINISTERED A GAS THAT CAUSED HER HEART TO BEAT IRREGULAR- PT HAS HAD NO PROBLEMS WITH THIS SINCE 2008  . Dysrhythmia    DUE TO A GAS THAT WAS ADMINISTERED DURING A 2008 C-SECTION-NO PROBLEMS SINCE  . Fibroid   . GERD (  gastroesophageal reflux disease)    OCC  . IDA (iron deficiency anemia) 01/25/2015  . UTI (lower urinary tract infection)     Surgical History: Past Surgical History:  Procedure Laterality Date  . CESAREAN SECTION     X2  . DILATION AND CURETTAGE OF UTERUS    . DILITATION & CURRETTAGE/HYSTROSCOPY WITH NOVASURE ABLATION N/A 07/23/2016   Procedure: DILATATION & CURETTAGE/HYSTEROSCOPY WITH NOVASURE ABLATION;  Surgeon: Brayton Mars, MD;  Location: [facility] ORS;  Service: Gynecology;  Laterality: N/A;    Family History: Family History  Problem Relation Age of Onset  . Diabetes Father   . Hypertension Father   . Colon cancer Maternal Aunt   . Breast cancer Maternal Aunt        great  . Heart disease Maternal Aunt   . Diabetes Maternal Grandmother   . Hypertension Mother   . Thyroid disease Mother   . Lung cancer Mother   . Asthma  Mother   . Hypertension Brother   . Ovarian cancer Neg Hx     Social History: Social History   Socioeconomic History  . Marital status: Single    Spouse name: Not on file  . Number of children: Not on file  . Years of education: Not on file  . Highest education level: Not on file  Occupational History  . Not on file  Social Needs  . Financial resource strain: Not on file  . Food insecurity:    Worry: Not on file    Inability: Not on file  . Transportation needs:    Medical: Not on file    Non-medical: Not on file  Tobacco Use  . Smoking status: Current Every Day Smoker    Packs/day: 0.25    Years: 25.00    Pack years: 6.25    Types: Cigarettes    Start date: 07/03/1992  . Smokeless tobacco: Never Used  Substance and Sexual Activity  . Alcohol use: Yes    Comment: every 2-3 weeks  . Drug use: Yes    Types: Marijuana    Comment: once a week  . Sexual activity: Yes    Birth control/protection: None    Comment: Ablation  Lifestyle  . Physical activity:    Days per week: Not on file    Minutes per session: Not on file  . Stress: Not on file  Relationships  . Social connections:    Talks on phone: Not on file    Gets together: Not on file    Attends religious service: Not on file    Active member of club or organization: Not on file    Attends meetings of clubs or organizations: Not on file    Relationship status: Not on file  . Intimate partner violence:    Fear of current or ex partner: Not on file    Emotionally abused: Not on file    Physically abused: Not on file    Forced sexual activity: Not on file  Other Topics Concern  . 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 acetaminophen (TYLENOL) 325 MG tablet Take 650  mg by mouth every 6 (six) hours as needed for moderate pain.     . fluconazole (DIFLUCAN) 150  MG tablet Take 1 tablet for a yeast infection if needed, may repeat in 3 days if needed 2 tablet 0  . fluticasone (FLONASE) 50 MCG/ACT nasal spray Place 2 sprays into both nostrils daily. 16 g 6  . ibuprofen (ADVIL,MOTRIN) 600 MG tablet Take 1 tablet (600 mg total) by mouth every 6 (six) hours as needed. 30 tablet 0  . losartan (COZAAR) 25 MG tablet Take 25 mg by mouth daily.  3  . montelukast (SINGULAIR) 10 MG tablet Take 1 tablet (10 mg total) by mouth daily as needed (for allergies.). 90 tablet 1   No current facility-administered medications for this visit.     Review of Systems: Review of Systems  Constitutional: Positive for fatigue. Negative for chills and fever.  Respiratory: Positive for shortness of breath.   Cardiovascular: Positive for chest pain.  Musculoskeletal: Positive for myalgias.  All other systems reviewed and are negative.    PHYSICAL EXAMINATION Blood pressure (!) 154/98, pulse 67, temperature 98.2 F (36.8 C), temperature source Oral, resp. rate 18, height '5\' 4"'  (1.626 m), weight 204 lb (92.5 kg), SpO2 99 %.  ECOG PERFORMANCE STATUS: 1 - Symptomatic but completely ambulatory  Physical Exam  Constitutional: She is oriented to person, place, and time and well-developed, well-nourished, and in no distress. No distress.  HENT:  Head: Normocephalic and atraumatic.  Mouth/Throat: Oropharynx is clear and moist. No oropharyngeal exudate.  Eyes: Pupils are equal, round, and reactive to light. Conjunctivae and EOM are normal. No scleral icterus.  Neck: No thyromegaly present.  Cardiovascular: Normal rate, regular rhythm, normal heart sounds and intact distal pulses.  No murmur heard. Pulmonary/Chest: Effort normal and breath sounds normal. No respiratory distress. She has no wheezes. She has no rales. She exhibits no tenderness.  Abdominal: Soft. Bowel sounds are normal. She  exhibits no distension and no mass. There is no tenderness. There is no rebound and no guarding.  Musculoskeletal: She exhibits no edema.  Lymphadenopathy:    She has no cervical adenopathy.  Neurological: She is alert and oriented to person, place, and time. She has normal reflexes. No cranial nerve deficit.  Skin: Skin is warm and dry. No rash noted. She is not diaphoretic. No erythema. No pallor.     LABORATORY DATA: I have personally reviewed the data as listed: Appointment on 01/16/2018  Component Date Value Ref Range Status  . WBC Count 01/16/2018 8.4  3.9 - 10.3 K/uL Final  . RBC 01/16/2018 4.65  3.70 - 5.45 MIL/uL Final  . Hemoglobin 01/16/2018 14.5  11.6 - 15.9 g/dL Final  . HCT 01/16/2018 42.5  34.8 - 46.6 % Final  . MCV 01/16/2018 91.3  79.5 - 101.0 fL Final  . MCH 01/16/2018 31.1  25.1 - 34.0 pg Final  . MCHC 01/16/2018 34.1  31.5 - 36.0 g/dL Final  . RDW 01/16/2018 15.3* 11.2 - 14.5 % Final  . Platelet Count 01/16/2018 240  145 - 400 K/uL Final  . Neutrophils Relative % 01/16/2018 56  % Final  . Neutro Abs 01/16/2018 4.7  1.5 - 6.5 K/uL Final  . Lymphocytes Relative 01/16/2018 38  % Final  . Lymphs Abs 01/16/2018 3.2  0.9 - 3.3 K/uL Final  . Monocytes Relative 01/16/2018 4  % Final  . Monocytes Absolute 01/16/2018 0.4  0.1 - 0.9 K/uL Final  . Eosinophils Relative 01/16/2018 1  % Final  . Eosinophils Absolute 01/16/2018 0.1  0.0 - 0.5 K/uL Final  . Basophils Relative 01/16/2018 1  %  Final  . Basophils Absolute 01/16/2018 0.1  0.0 - 0.1 K/uL Final   Performed at [facility], [place] [address] [place], [place] [address]  . Sodium 01/16/2018 137  136 - 145 mmol/L Final  . Potassium 01/16/2018 3.9  3.5 - 5.1 mmol/L Final  . Chloride 01/16/2018 108  98 - 109 mmol/L Final  . CO2 01/16/2018 22  22 - 29 mmol/L Final  . Glucose, Bld 01/16/2018 91  70 - 140 mg/dL Final  . BUN 01/16/2018 11  7 - 26 mg/dL Final  . Creatinine 01/16/2018 0.72  0.60 - 1.10  mg/dL Final  . Calcium 01/16/2018 9.1  8.4 - 10.4 mg/dL Final  . Total Protein 01/16/2018 7.3  6.4 - 8.3 g/dL Final  . Albumin 01/16/2018 3.9  3.5 - 5.0 g/dL Final  . AST 01/16/2018 18  5 - 34 U/L Final  . ALT 01/16/2018 19  0 - 55 U/L Final  . Alkaline Phosphatase 01/16/2018 50  40 - 150 U/L Final  . Total Bilirubin 01/16/2018 0.3  0.2 - 1.2 mg/dL Final  . GFR, Est Non Af Am 01/16/2018 >60  >60 mL/min Final  . GFR, Est AFR Am 01/16/2018 >60  >60 mL/min Final   Comment: (NOTE) The eGFR has been calculated using the CKD EPI equation. This calculation has not been validated in all clinical situations. eGFR's persistently <60 mL/min signify possible Chronic Kidney Disease.   Georgiann Hahn gap 01/16/2018 7  3 - 11 Final   Performed at [facility], [place] [address], [place], [place] [address]  . Iron 01/16/2018 74  41 - 142 ug/dL Final  . TIBC 01/16/2018 315  236 - 444 ug/dL Final  . Saturation Ratios 01/16/2018 23  21 - 57 % Final  . UIBC 01/16/2018 241  ug/dL Final   Performed at [facility], [place] [address], [place], [place] [address]  . Ferritin 01/16/2018 117  9 - 269 ng/mL Final   Performed at [facility], [place] [address] [place],  [address]       Ardath Sax, MD

## 2018-02-10 NOTE — Assessment & Plan Note (Signed)
47 y.o. female with well-documented history of microcytic hypochromic anemia consistent with iron deficiency unsupported by appropriate iron testing of at least 3-year duration.  Most likely iron deficiency precipitated by recurrent uterine bleeding due to history of uterine fibroids.  No history of gastrointestinal bleeding.  Patient has received additional iron supplementation resulting in significant hemoglobin and iron stores.  At this point in time, she does not need any additional iron parenterally.   Plan: -Monthly labs to assess the rate of hemoglobin decline. -Return to clinic in 3 months: Labs 3 days prior to the visit, clinic, possible IV iron infusion.

## 2018-02-12 ENCOUNTER — Encounter: Payer: Self-pay | Admitting: Obstetrics and Gynecology

## 2018-02-12 ENCOUNTER — Ambulatory Visit (INDEPENDENT_AMBULATORY_CARE_PROVIDER_SITE_OTHER): Payer: BLUE CROSS/BLUE SHIELD | Admitting: Obstetrics and Gynecology

## 2018-02-12 VITALS — BP 166/109 | HR 76 | Ht 64.0 in | Wt 205.3 lb

## 2018-02-12 DIAGNOSIS — D259 Leiomyoma of uterus, unspecified: Secondary | ICD-10-CM

## 2018-02-12 DIAGNOSIS — N939 Abnormal uterine and vaginal bleeding, unspecified: Secondary | ICD-10-CM | POA: Diagnosis not present

## 2018-02-12 DIAGNOSIS — Z9889 Other specified postprocedural states: Secondary | ICD-10-CM

## 2018-02-12 DIAGNOSIS — R102 Pelvic and perineal pain: Secondary | ICD-10-CM | POA: Diagnosis not present

## 2018-02-12 NOTE — Patient Instructions (Addendum)
1.  LAVH bilateral salpingectomy is to be scheduled today 2.  Return the week before surgery for preop appointment 3.  Expect to be out for 6 weeks after surgery for convalescence  Laparoscopically Assisted Vaginal Hysterectomy A laparoscopically assisted vaginal hysterectomy (LAVH) is a surgical procedure to remove the uterus and cervix, and sometimes the ovaries and fallopian tubes. During an LAVH, some of the surgical removal is done through the vagina, and the rest is done through a few small surgical cuts (incisions) in the abdomen. This procedure is usually considered in women when a vaginal hysterectomy is not an option. Your health care provider will discuss the risks and benefits of the different surgical techniques at your appointment. Generally, recovery time is faster and there are fewer complications after laparoscopic procedures than after open incisional procedures. Tell a health care provider about:  Any allergies you have.  All medicines you are taking, including vitamins, herbs, eye drops, creams, and over-the-counter medicines.  Any problems you or family members have had with anesthetic medicines.  Any blood disorders you have.  Any surgeries you have had.  Any medical conditions you have. What are the risks? Generally, this is a safe procedure. However, as with any procedure, complications can occur. Possible complications include:  Allergies to medicines.  Difficulty breathing.  Bleeding.  Infection.  Damage to other structures near your uterus and cervix.  What happens before the procedure?  Ask your health care provider about changing or stopping your regular medicines.  Take certain medicines, such as a colon-emptying preparation, as directed.  Do not eat or drink anything for at least 8 hours before your surgery.  Stop smoking if you smoke. Stopping will improve your health after surgery.  Arrange for a ride home after surgery and for help at home  during recovery. What happens during the procedure?  An IV tube will be put into one of your veins in order to give you fluids and medicines.  You will receive medicines to relax you and medicines that make you sleep (general anesthetic).  You may have a flexible tube (catheter) put into your bladder to drain urine.  You may have a tube put through your nose or mouth that goes into your stomach (nasogastric tube). The nasogastric tube removes digestive fluids and prevents you from feeling nauseated and from vomiting.  Tight-fitting (compression) stockings will be placed on your legs to promote circulation.  Three to four small incisions will be made in your abdomen. An incision also will be made in your vagina. Probes and tools will be inserted into the small incisions. The uterus and cervix are removed (and possibly your ovaries and fallopian tubes) through your vagina as well as through the small incisions that were made in the abdomen.  Your vagina is then sewn back to normal. What happens after the procedure?  You may have a liquid diet temporarily. You will most likely return to, and tolerate, your usual diet the day after surgery.  You will be passing urine through a catheter. It will be removed the day after surgery.  Your temperature, breathing rate, heart rate, blood pressure, and oxygen level will be monitored regularly.  You will still wear compression stockings on your legs until you are able to move around.  You will use a special device or do breathing exercises to keep your lungs clear.  You will be encouraged to walk as soon as possible. This information is not intended to replace advice given to you  by your health care provider. Make sure you discuss any questions you have with your health care provider. Document Released: 08/23/2011 Document Revised: 02/09/2016 Document Reviewed: 03/19/2013 Elsevier Interactive Patient Education  [employer].

## 2018-02-12 NOTE — Progress Notes (Signed)
GYN ENCOUNTER NOTE  Subjective:       Jill Davidson is a 47 y.o. [phone] female is here for gynecologic evaluation of the following issues:  1.  Symptomatic fibroid uterus  Long history of chronic pelvic pain, worsening, status post hysteroscopy D&C/endometrial ablation in 07/23/2016.  Since her surgery she has had persistent cycles which have worsened recently following iron infusions for chronic anemia.  She has associated pelvic pain categorized as 5 out of 10 in intensity.  The pain is central and bilateral cramping without back pain component.  Long history of abnormal uterine bleeding, previously treated with Provera 30 mg daily, trial of IUD, without success; history of chronic endometritis in December 2016 which was treated with doxycycline twice daily for 14 days; bleeding and pain continue.  History of multi-fibroid uterus with largest fibroid measuring 3.5 cm.  Patient is desiring definitive surgery at this time due to the pain, bleeding, irritation from wearing pads chronically.   Gynecologic History Patient's last menstrual period was 02/05/2018. Menarche age 43. Past interval is 21-23 days with heavy moderate bleeding lasting upwards of 8 days. She needs multiple pads daily to control clots. Patient had history of worsening dysmenorrhea with radiation to back and legs over the past 4 years (prior to ablation) History of anemia from menorrhagia which has required a transfusion in the past, and most recently iron infusions.  Obstetric History OB History  Gravida Para Term Preterm AB Living  6 4 4   2 4   SAB TAB Ectopic Multiple Live Births  2       4    # Outcome Date GA Lbr Len/2nd Weight Sex Delivery Anes PTL Lv  6 SAB 2010          5 SAB 1996          4 Term 1994   6 lb (2.722 kg) M VBAC   LIV  3 Term 1992   7 lb (3.175 kg) M CS-LTranv   LIV  2 Term 1991   6 lb (2.722 kg) M Vag-Spont   LIV  1 Term 1989   7 lb 2.2 oz (3.239 kg) F Vag-Spont   LIV    Past Medical  History:  Diagnosis Date  . AR (allergic rhinitis)   . Bacterial vaginosis   . Complication of anesthesia 2008   PT STATES THAT DURING C-SECTION SHE WAS ADMINISTERED A GAS THAT CAUSED HER HEART TO BEAT IRREGULAR- PT HAS HAD NO PROBLEMS WITH THIS SINCE 2008  . Dysrhythmia    DUE TO A GAS THAT WAS ADMINISTERED DURING A 2008 C-SECTION-NO PROBLEMS SINCE  . Fibroid   . GERD (gastroesophageal reflux disease)    OCC  . Hypertension   . IDA (iron deficiency anemia) 01/25/2015  . UTI (lower urinary tract infection)     Past Surgical History:  Procedure Laterality Date  . CESAREAN SECTION     X2  . DILATION AND CURETTAGE OF UTERUS    . DILITATION & CURRETTAGE/HYSTROSCOPY WITH NOVASURE ABLATION N/A 07/23/2016   Procedure: DILATATION & CURETTAGE/HYSTEROSCOPY WITH NOVASURE ABLATION;  Surgeon: Brayton Mars, MD;  Location: [facility] ORS;  Service: Gynecology;  Laterality: N/A;    Current Outpatient Medications on File Prior to Visit  Medication Sig Dispense Refill  . acetaminophen (TYLENOL) 325 MG tablet Take 650 mg by mouth every 6 (six) hours as needed for moderate pain.     . fluticasone (FLONASE) 50 MCG/ACT nasal spray Place 2 sprays into both nostrils daily.  16 g 6  . losartan (COZAAR) 25 MG tablet Take 25 mg by mouth daily.  3  . montelukast (SINGULAIR) 10 MG tablet Take 1 tablet (10 mg total) by mouth daily as needed (for allergies.). 90 tablet 1  . Vitamin D, Ergocalciferol, (DRISDOL) 50000 units CAPS capsule   0   No current facility-administered medications on file prior to visit.     No Known Allergies  Social History   Socioeconomic History  . Marital status: Single    Spouse name: Not on file  . Number of children: Not on file  . Years of education: Not on file  . Highest education level: Not on file  Occupational History  . Not on file  Social Needs  . Financial resource strain: Not on file  . Food insecurity:    Worry: Not on file    Inability: Not on file  .  Transportation needs:    Medical: Not on file    Non-medical: Not on file  Tobacco Use  . Smoking status: Current Every Day Smoker    Packs/day: 0.25    Years: 25.00    Pack years: 6.25    Types: Cigarettes    Start date: 07/03/1992  . Smokeless tobacco: Never Used  Substance and Sexual Activity  . Alcohol use: Yes    Comment: every 2-3 weeks  . Drug use: Yes    Types: Marijuana    Comment: once a week  . Sexual activity: Yes    Birth control/protection: None    Comment: Ablation  Lifestyle  . Physical activity:    Days per week: Not on file    Minutes per session: Not on file  . Stress: Not on file  Relationships  . Social connections:    Talks on phone: Not on file    Gets together: Not on file    Attends religious service: Not on file    Active member of club or organization: Not on file    Attends meetings of clubs or organizations: Not on file    Relationship status: Not on file  . Intimate partner violence:    Fear of current or ex partner: Not on file    Emotionally abused: Not on file    Physically abused: Not on file    Forced sexual activity: Not on file  Other Topics Concern  . 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 Diabetes Father   . Hypertension Father   . Colon cancer Maternal Aunt   . Breast cancer Maternal Aunt        great  . Heart disease Maternal Aunt   . Diabetes Maternal Grandmother   . Hypertension Mother   . Thyroid disease Mother   . Lung cancer Mother   . Asthma Mother   . Hypertension Brother   . Ovarian cancer Neg Hx     The following portions of the patient's history were reviewed and updated as appropriate: allergies, current medications, past family history, past medical history, past social history, past surgical history and problem list.  Review  of Systems Review of Systems -comprehensive review of systems is negative except for that noted in the HPI  Objective:   BP (!) 166/109   Pulse 76   Ht  5\' 4"  (1.626 m)   Wt 205 lb 4.8 oz (93.1 kg)   LMP 02/05/2018   BMI 35.24 kg/m  CONSTITUTIONAL: Well-developed, well-nourished female in no acute distress.  HENT:  Normocephalic, atraumatic.  NECK: Normal range of motion, supple, no masses.  Normal thyroid.  SKIN: Skin is warm and dry. No rash noted. Not diaphoretic. No erythema. No pallor. [place]: Alert and oriented to person, place, and time. PSYCHIATRIC: Normal mood and affect. Normal behavior. Normal judgment and thought content. CARDIOVASCULAR:Not Examined RESPIRATORY: Not Examined BREASTS: Not Examined ABDOMEN: Soft, non distended; Non tender.  No Organomegaly.  Pfannenstiel incision is well-healed without evidence of hernia PELVIC:  External Genitalia: Normal  BUS: Normal  Vagina: Normal  Cervix: Normal; parous; no lesions; cervical motion tenderness 1/4  Uterus: 8 weeks size, mobile, anterior, 2/4 tender  Adnexa: Normal; nonpalpable and nontender  RV: Normal external exam  Bladder: Nontender MUSCULOSKELETAL: Normal range of motion. No tenderness.  No cyanosis, clubbing, or edema.     Assessment:   1. Uterine leiomyoma, unspecified location, 8 weeks size  2. Status post endometrial ablation  3. Abnormal uterine bleeding, ongoing  4. Pelvic pain in female, worsening     Plan:   1.  LAVH bilateral salpingectomy 2.  Patient is to return for preop appointment the week before surgery 3.  Patient is not interested in any further medical management, and desires surgical intervention  A total of 15 minutes were spent face-to-face with the patient during this encounter and over half of that time dealt with counseling and coordination of care.  Brayton Mars, MD  Note: This dictation was prepared with Dragon dictation along with smaller phrase  technology. Any transcriptional errors that result from this process are unintentional.

## 2018-02-21 ENCOUNTER — Inpatient Hospital Stay: Payer: BLUE CROSS/BLUE SHIELD

## 2018-02-21 ENCOUNTER — Telehealth: Payer: Self-pay | Admitting: Hematology

## 2018-02-21 NOTE — Telephone Encounter (Signed)
Patient called to reschedule  °

## 2018-02-24 ENCOUNTER — Inpatient Hospital Stay: Payer: BLUE CROSS/BLUE SHIELD | Attending: Hematology and Oncology

## 2018-02-24 DIAGNOSIS — D649 Anemia, unspecified: Secondary | ICD-10-CM | POA: Diagnosis not present

## 2018-02-24 DIAGNOSIS — I10 Essential (primary) hypertension: Secondary | ICD-10-CM | POA: Diagnosis not present

## 2018-02-24 DIAGNOSIS — R319 Hematuria, unspecified: Secondary | ICD-10-CM | POA: Diagnosis not present

## 2018-02-24 DIAGNOSIS — D508 Other iron deficiency anemias: Secondary | ICD-10-CM | POA: Insufficient documentation

## 2018-02-24 DIAGNOSIS — R809 Proteinuria, unspecified: Secondary | ICD-10-CM | POA: Diagnosis not present

## 2018-02-24 LAB — CMP (CANCER CENTER ONLY)
ALBUMIN: 3.9 g/dL (ref 3.5–5.0)
ALT: 11 U/L (ref 0–55)
ANION GAP: 8 (ref 3–11)
AST: 16 U/L (ref 5–34)
Alkaline Phosphatase: 49 U/L (ref 40–150)
BUN: 15 mg/dL (ref 7–26)
CO2: 26 mmol/L (ref 22–29)
Calcium: 9.3 mg/dL (ref 8.4–10.4)
Chloride: 106 mmol/L (ref 98–109)
Creatinine: 0.76 mg/dL (ref 0.60–1.10)
GFR, Est AFR Am: >60 mL/min (ref >60)
GFR, Estimated: >60 mL/min (ref >60)
GLUCOSE: 112 mg/dL (ref 70–140)
POTASSIUM: 3.6 mmol/L (ref 3.5–5.1)
SODIUM: 140 mmol/L (ref 136–145)
Total Bilirubin: 0.3 mg/dL (ref 0.2–1.2)
Total Protein: 7.1 g/dL (ref 6.4–8.3)

## 2018-02-24 LAB — CBC WITH DIFFERENTIAL (CANCER CENTER ONLY)
BASOS ABS: 0.1 10*3/uL (ref 0.0–0.1)
Basophils Relative: 1 %
Eosinophils Absolute: 0.2 10*3/uL (ref 0.0–0.5)
Eosinophils Relative: 2 %
HEMATOCRIT: 42.3 % (ref 34.8–46.6)
HEMOGLOBIN: 14.4 g/dL (ref 11.6–15.9)
LYMPHS PCT: 42 %
Lymphs Abs: 4.5 10*3/uL — ABNORMAL HIGH (ref 0.9–3.3)
MCH: 31.8 pg (ref 25.1–34.0)
MCHC: 34 g/dL (ref 31.5–36.0)
MCV: 93.5 fL (ref 79.5–101.0)
MONO ABS: 0.6 10*3/uL (ref 0.1–0.9)
MONOS PCT: 6 %
NEUTROS ABS: 5.3 10*3/uL (ref 1.5–6.5)
NEUTROS PCT: 49 %
Platelet Count: 262 10*3/uL (ref 145–400)
RBC: 4.52 MIL/uL (ref 3.70–5.45)
RDW: 13.7 % (ref 11.2–14.5)
WBC Count: 10.7 10*3/uL — ABNORMAL HIGH (ref 3.9–10.3)

## 2018-02-24 LAB — IRON AND TIBC
Iron: 61 ug/dL (ref 41–142)
SATURATION RATIOS: 18 % — AB (ref 21–57)
TIBC: 330 ug/dL (ref 236–444)
UIBC: 269 ug/dL

## 2018-02-24 LAB — FERRITIN: Ferritin: 87 ng/mL (ref 9–269)

## 2018-02-25 ENCOUNTER — Encounter: Payer: Self-pay | Admitting: Obstetrics and Gynecology

## 2018-03-18 ENCOUNTER — Other Ambulatory Visit: Payer: Self-pay

## 2018-03-18 ENCOUNTER — Encounter: Payer: Self-pay | Admitting: Obstetrics and Gynecology

## 2018-03-18 ENCOUNTER — Ambulatory Visit (INDEPENDENT_AMBULATORY_CARE_PROVIDER_SITE_OTHER): Payer: BLUE CROSS/BLUE SHIELD | Admitting: Obstetrics and Gynecology

## 2018-03-18 ENCOUNTER — Encounter
Admission: RE | Admit: 2018-03-18 | Discharge: 2018-03-18 | Disposition: A | Discharge status: Home / Self Care | Payer: BLUE CROSS/BLUE SHIELD | Source: Ambulatory Visit | Attending: Obstetrics and Gynecology | Admitting: Obstetrics and Gynecology

## 2018-03-18 VITALS — BP 144/83 | HR 65 | Ht 64.0 in | Wt 205.3 lb

## 2018-03-18 DIAGNOSIS — R001 Bradycardia, unspecified: Secondary | ICD-10-CM | POA: Insufficient documentation

## 2018-03-18 DIAGNOSIS — Z01810 Encounter for preprocedural cardiovascular examination: Secondary | ICD-10-CM | POA: Insufficient documentation

## 2018-03-18 DIAGNOSIS — D259 Leiomyoma of uterus, unspecified: Secondary | ICD-10-CM

## 2018-03-18 DIAGNOSIS — I10 Essential (primary) hypertension: Secondary | ICD-10-CM | POA: Diagnosis not present

## 2018-03-18 DIAGNOSIS — Z01812 Encounter for preprocedural laboratory examination: Secondary | ICD-10-CM | POA: Insufficient documentation

## 2018-03-18 DIAGNOSIS — Z01818 Encounter for other preprocedural examination: Secondary | ICD-10-CM | POA: Diagnosis not present

## 2018-03-18 DIAGNOSIS — Z9889 Other specified postprocedural states: Secondary | ICD-10-CM | POA: Diagnosis not present

## 2018-03-18 DIAGNOSIS — N939 Abnormal uterine and vaginal bleeding, unspecified: Secondary | ICD-10-CM

## 2018-03-18 LAB — CBC WITH DIFFERENTIAL/PLATELET
BASOS ABS: 0.1 10*3/uL (ref 0–0.1)
Basophils Relative: 1 %
EOS PCT: 1 %
Eosinophils Absolute: 0.1 10*3/uL (ref 0–0.7)
HEMATOCRIT: 42.1 % (ref 35.0–47.0)
Hemoglobin: 14.3 g/dL (ref 12.0–16.0)
LYMPHS ABS: 3.7 10*3/uL — AB (ref 1.0–3.6)
LYMPHS PCT: 33 %
MCH: 32.3 pg (ref 26.0–34.0)
MCHC: 34 g/dL (ref 32.0–36.0)
MCV: 94.8 fL (ref 80.0–100.0)
MONO ABS: 0.7 10*3/uL (ref 0.2–0.9)
Monocytes Relative: 6 %
NEUTROS ABS: 6.8 10*3/uL — AB (ref 1.4–6.5)
Neutrophils Relative %: 59 %
Platelets: 277 10*3/uL (ref 150–440)
RBC: 4.44 MIL/uL (ref 3.80–5.20)
RDW: 13.7 % (ref 11.5–14.5)
WBC: 11.4 10*3/uL — AB (ref 3.6–11.0)

## 2018-03-18 LAB — TYPE AND SCREEN
ABO/RH(D): A POS
ANTIBODY SCREEN: NEGATIVE

## 2018-03-18 LAB — RAPID HIV SCREEN (HIV 1/2 AB+AG)
HIV 1/2 ANTIBODIES: NONREACTIVE
HIV-1 P24 ANTIGEN - HIV24: NONREACTIVE

## 2018-03-18 NOTE — H&P (Signed)
PREOPERATIVE HISTORY AND PHYSICAL  Date of surgery: 03/24/2018 Procedure: LAVH bilateral salpingectomy Diagnoses: 1.  Symptomatic uterine fibroids 2.  Abnormal uterine bleeding 3.  Status post endometrial ablation    Patient is a 47 y.o. [id number] scheduled for surgery on 03/24/2018. Long history of chronic pelvic pain, worsening, status post hysteroscopy D&C/endometrial ablation in 07/23/2016.  Since her surgery she has had persistent cycles which have worsened recently following iron infusions for chronic anemia.  She has associated pelvic pain categorized as 5 out of 10 in intensity.  The pain is central and bilateral cramping without back pain component.  Long history of abnormal uterine bleeding, previously treated with Provera 30 mg daily, trial of IUD, without success; history of chronic endometritis in December 2016 which was treated with doxycycline twice daily for 14 days; bleeding and pain continue.  History of multi-fibroid uterus with largest fibroid measuring 3.5 cm.  Patient is desiring definitive surgery at this time due to the pain, bleeding, irritation from wearing pads chronically.  Gynecologic History Patient's last menstrual period was 02/05/2018. Menarche age 53. Pastinterval is 21-23 days with heavy moderate bleeding lasting upwards of 8 days. She needs multiple pads daily to control clots. Patient had history of worsening dysmenorrhea with radiation to back and legs over the past 4 years (prior to ablation) History of anemia from menorrhagia which has required a transfusion in the past, and most recently iron infusions.    OB History    Gravida  6   Para  4   Term  4   Preterm      AB  2   Living  4     SAB  2   TAB      Ectopic      Multiple      Live Births  4           Patient's last menstrual period was 02/26/2018 (approximate).    Past Medical History:  Diagnosis Date  . AR (allergic rhinitis)   . Bacterial vaginosis   .  Complication of anesthesia 2008   PT STATES THAT DURING C-SECTION SHE WAS ADMINISTERED A GAS THAT CAUSED HER HEART TO BEAT IRREGULAR- PT HAS HAD NO PROBLEMS WITH THIS SINCE 2008  . Dysrhythmia    DUE TO A GAS THAT WAS ADMINISTERED DURING A 2008 C-SECTION-NO PROBLEMS SINCE  . Fibroid   . GERD (gastroesophageal reflux disease)    OCC  . Hypertension   . IDA (iron deficiency anemia) 01/25/2015  . UTI (lower urinary tract infection)     Past Surgical History:  Procedure Laterality Date  . CESAREAN SECTION     X2  . DILATION AND CURETTAGE OF UTERUS    . DILITATION & CURRETTAGE/HYSTROSCOPY WITH NOVASURE ABLATION N/A 07/23/2016   Procedure: DILATATION & CURETTAGE/HYSTEROSCOPY WITH NOVASURE ABLATION;  Surgeon: Brayton Mars, MD;  Location: [facility] ORS;  Service: Gynecology;  Laterality: N/A;    OB History  Gravida Para Term Preterm AB Living  6 4 4   2 4   SAB TAB Ectopic Multiple Live Births  2       4    # Outcome Date GA Lbr Len/2nd Weight Sex Delivery Anes PTL Lv  6 SAB 2010          5 SAB 1996          4 Term 1994   6 lb (2.722 kg) M VBAC   LIV  3 Term 1992   7 lb (3.175 kg) M  CS-LTranv   LIV  2 Term 1991   6 lb (2.722 kg) M Vag-Spont   LIV  1 Term 1989   7 lb 2.2 oz (3.239 kg) F Vag-Spont   LIV    Social History   Socioeconomic History  . Marital status: Single    Spouse name: Not on file  . Number of children: Not on file  . Years of education: Not on file  . Highest education level: Not on file  Occupational History  . Not on file  Social Needs  . Financial resource strain: Not on file  . Food insecurity:    Worry: Not on file    Inability: Not on file  . Transportation needs:    Medical: Not on file    Non-medical: Not on file  Tobacco Use  . Smoking status: Current Every Day Smoker    Packs/day: 0.25    Years: 25.00    Pack years: 6.25    Types: Cigarettes    Start date: 07/03/1992  . Smokeless tobacco: Never Used  Substance and Sexual Activity  .  Alcohol use: Yes    Comment: every 2-3 weeks  . Drug use: Yes    Types: Marijuana    Comment: once a week  . Sexual activity: Yes    Birth control/protection: None    Comment: Ablation  Lifestyle  . Physical activity:    Days per week: Not on file    Minutes per session: Not on file  . Stress: Not on file  Relationships  . Social connections:    Talks on phone: Not on file    Gets together: Not on file    Attends religious service: Not on file    Active member of club or organization: Not on file    Attends meetings of clubs or organizations: Not on file    Relationship status: Not on file  Other Topics Concern  . 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 Diabetes Father   . Hypertension Father   . Colon cancer Maternal Aunt   . Breast cancer Maternal Aunt        great  . Heart disease Maternal Aunt   . Diabetes Maternal Grandmother   . Hypertension Mother   . Thyroid disease Mother   . Lung cancer Mother   . Asthma Mother   . Hypertension Brother   . Ovarian cancer Neg Hx      (Not in a hospital admission)  No Known Allergies  Review of Systems Constitutional: No recent fever/chills/sweats Respiratory: No recent cough/bronchitis Cardiovascular: No chest pain Gastrointestinal: No recent nausea/vomiting/diarrhea Genitourinary: No UTI symptoms Hematologic/lymphatic:No history of coagulopathy or recent blood thinner use    Objective:    BP (!) 144/83   Pulse 65   Ht 5\' 4"  (1.626 m)   Wt 205 lb 4.8 oz (93.1 kg)   LMP 02/26/2018 (Approximate)   BMI 35.24 kg/m   General:   Normal  Skin:   normal  HEENT:  Normal  Neck:  Supple without Adenopathy or Thyromegaly  Lungs:   Heart:              Breasts:   Abdomen:  Pelvis:  M/S   Extremeties:  Neuro:    clear to  auscultation  bilaterally   Normal without murmur   Not Examined   soft, non-tender; bowel sounds normal; no masses,  no organomegaly   Exam deferred to OR  No CVAT  Warm/Dry   Normal         02/12/2018 ABDOMEN: Soft, non distended; Non tender.  No Organomegaly.  Pfannenstiel incision is well-healed without evidence of hernia PELVIC:             External Genitalia: Normal             BUS: Normal             Vagina: Normal             Cervix: Normal; parous; no lesions; cervical motion tenderness 1/4             Uterus: 8 weeks size, mobile, anterior, 2/4 tender             Adnexa: Normal; nonpalpable and nontender             RV: Normal external exam             Bladder: Nontender   Assessment:    1.  Symptomatic fibroid uterus 2.  Abnormal uterine bleeding 3.  Status post endometrial ablation   Plan:  LAVH bilateral salpingectomy  Preop counseling: Patient is to undergo LAVH bilateral salpingectomy for management of symptomatic fibroid uterus.  She is understanding of the planned procedure and is aware of and is accepting of all surgical risks which include but are not limited to bleeding, infection, pelvic organ injury with need for repair, blood clot disorders, anesthesia risk, etc.  All questions have been answered.  Informed consent is given.  Patient is very willing to proceed with surgery as scheduled.  Brayton Mars, MD  Note: This dictation was prepared with Dragon dictation along with smaller phrase technology. Any transcriptional errors that result from this process are unintentional.

## 2018-03-18 NOTE — Patient Instructions (Signed)
Return on 04/02/2018 for postop check  Laparoscopically Assisted Vaginal Hysterectomy, Care After Refer to this sheet in the next few weeks. These instructions provide you with information on caring for yourself after your procedure. Your health care provider may also give you more specific instructions. Your treatment has been planned according to current medical practices, but problems sometimes occur. Call your health care provider if you have any problems or questions after your procedure. What can I expect after the procedure? After your procedure, it is typical to have the following:  Abdominal pain. You will be given pain medicine to control it.  Sore throat from the breathing tube that was inserted during surgery.  Follow these instructions at home:  Only take over-the-counter or prescription medicines for pain, discomfort, or fever as directed by your health care provider.  Do not take aspirin. It can cause bleeding.  Do not drive when taking pain medicine.  Follow your health care provider's advice regarding diet, exercise, lifting, driving, and general activities.  Resume your usual diet as directed and allowed.  Get plenty of rest and sleep.  Do not douche, use tampons, or have sexual intercourse for at least 6 weeks, or until your health care provider gives you permission.  Change your bandages (dressings) as directed by your health care provider.  Monitor your temperature and notify your health care provider of a fever.  Take showers instead of baths for 2-3 weeks.  Do not drink alcohol until your health care provider gives you permission.  If you develop constipation, you may take a mild laxative with your health care provider's permission. Bran foods may help with constipation problems. Drinking enough fluids to keep your urine clear or pale yellow may help as well.  Try to have someone home with you for 1-2 weeks to help around the house.  Keep all of your  follow-up appointments as directed by your health care provider. Contact a health care provider if:  You have swelling, redness, or increasing pain around your incision sites.  You have pus coming from your incision.  You notice a bad smell coming from your incision.  Your incision breaks open.  You feel dizzy or lightheaded.  You have pain or bleeding when you urinate.  You have persistent diarrhea.  You have persistent nausea and vomiting.  You have abnormal vaginal discharge.  You have a rash.  You have any type of abnormal reaction or develop an allergy to your medicine.  You have poor pain control with your prescribed medicine. Get help right away if:  You have a fever.  You have severe abdominal pain.  You have chest pain.  You have shortness of breath.  You faint.  You have pain, swelling, or redness in your leg.  You have heavy vaginal bleeding with blood clots. This information is not intended to replace advice given to you by your health care provider. Make sure you discuss any questions you have with your health care provider. Document Released: 08/23/2011 Document Revised: 02/09/2016 Document Reviewed: 03/19/2013 Elsevier Interactive Patient Education  2017 [employer].

## 2018-03-18 NOTE — Progress Notes (Signed)
PREOPERATIVE HISTORY AND PHYSICAL  Date of surgery: 03/24/2018 Procedure: LAVH bilateral salpingectomy Diagnoses: 1.  Symptomatic uterine fibroids 2.  Abnormal uterine bleeding 3.  Status post endometrial ablation    Patient is a 47 y.o. [id number] scheduled for surgery on 03/24/2018. Long history of chronic pelvic pain, worsening, status post hysteroscopy D&C/endometrial ablation in 07/23/2016.  Since her surgery she has had persistent cycles which have worsened recently following iron infusions for chronic anemia.  She has associated pelvic pain categorized as 5 out of 10 in intensity.  The pain is central and bilateral cramping without back pain component.  Long history of abnormal uterine bleeding, previously treated with Provera 30 mg daily, trial of IUD, without success; history of chronic endometritis in December 2016 which was treated with doxycycline twice daily for 14 days; bleeding and pain continue.  History of multi-fibroid uterus with largest fibroid measuring 3.5 cm.  Patient is desiring definitive surgery at this time due to the pain, bleeding, irritation from wearing pads chronically.  Gynecologic History Patient's last menstrual period was 02/05/2018. Menarche age 50. Pastinterval is 21-23 days with heavy moderate bleeding lasting upwards of 8 days. She needs multiple pads daily to control clots. Patient had history of worsening dysmenorrhea with radiation to back and legs over the past 4 years (prior to ablation) History of anemia from menorrhagia which has required a transfusion in the past, and most recently iron infusions.    OB History    Gravida  6   Para  4   Term  4   Preterm      AB  2   Living  4     SAB  2   TAB      Ectopic      Multiple      Live Births  4           Patient's last menstrual period was 02/26/2018 (approximate).    Past Medical History:  Diagnosis Date  . AR (allergic rhinitis)   . Bacterial vaginosis   .  Complication of anesthesia 2008   PT STATES THAT DURING C-SECTION SHE WAS ADMINISTERED A GAS THAT CAUSED HER HEART TO BEAT IRREGULAR- PT HAS HAD NO PROBLEMS WITH THIS SINCE 2008  . Dysrhythmia    DUE TO A GAS THAT WAS ADMINISTERED DURING A 2008 C-SECTION-NO PROBLEMS SINCE  . Fibroid   . GERD (gastroesophageal reflux disease)    OCC  . Hypertension   . IDA (iron deficiency anemia) 01/25/2015  . UTI (lower urinary tract infection)     Past Surgical History:  Procedure Laterality Date  . CESAREAN SECTION     X2  . DILATION AND CURETTAGE OF UTERUS    . DILITATION & CURRETTAGE/HYSTROSCOPY WITH NOVASURE ABLATION N/A 07/23/2016   Procedure: DILATATION & CURETTAGE/HYSTEROSCOPY WITH NOVASURE ABLATION;  Surgeon: Brayton Mars, MD;  Location: [facility] ORS;  Service: Gynecology;  Laterality: N/A;    OB History  Gravida Para Term Preterm AB Living  6 4 4   2 4   SAB TAB Ectopic Multiple Live Births  2       4    # Outcome Date GA Lbr Len/2nd Weight Sex Delivery Anes PTL Lv  6 SAB 2010          5 SAB 1996          4 Term 1994   6 lb (2.722 kg) M VBAC   LIV  3 Term 1992   7 lb (3.175 kg) M  CS-LTranv   LIV  2 Term 1991   6 lb (2.722 kg) M Vag-Spont   LIV  1 Term 1989   7 lb 2.2 oz (3.239 kg) F Vag-Spont   LIV    Social History   Socioeconomic History  . Marital status: Single    Spouse name: Not on file  . Number of children: Not on file  . Years of education: Not on file  . Highest education level: Not on file  Occupational History  . Not on file  Social Needs  . Financial resource strain: Not on file  . Food insecurity:    Worry: Not on file    Inability: Not on file  . Transportation needs:    Medical: Not on file    Non-medical: Not on file  Tobacco Use  . Smoking status: Current Every Day Smoker    Packs/day: 0.25    Years: 25.00    Pack years: 6.25    Types: Cigarettes    Start date: 07/03/1992  . Smokeless tobacco: Never Used  Substance and Sexual Activity  .  Alcohol use: Yes    Comment: every 2-3 weeks  . Drug use: Yes    Types: Marijuana    Comment: once a week  . Sexual activity: Yes    Birth control/protection: None    Comment: Ablation  Lifestyle  . Physical activity:    Days per week: Not on file    Minutes per session: Not on file  . Stress: Not on file  Relationships  . Social connections:    Talks on phone: Not on file    Gets together: Not on file    Attends religious service: Not on file    Active member of club or organization: Not on file    Attends meetings of clubs or organizations: Not on file    Relationship status: Not on file  Other Topics Concern  . 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 Diabetes Father   . Hypertension Father   . Colon cancer Maternal Aunt   . Breast cancer Maternal Aunt        great  . Heart disease Maternal Aunt   . Diabetes Maternal Grandmother   . Hypertension Mother   . Thyroid disease Mother   . Lung cancer Mother   . Asthma Mother   . Hypertension Brother   . Ovarian cancer Neg Hx      (Not in a hospital admission)  No Known Allergies  Review of Systems Constitutional: No recent fever/chills/sweats Respiratory: No recent cough/bronchitis Cardiovascular: No chest pain Gastrointestinal: No recent nausea/vomiting/diarrhea Genitourinary: No UTI symptoms Hematologic/lymphatic:No history of coagulopathy or recent blood thinner use    Objective:    BP (!) 144/83   Pulse 65   Ht 5\' 4"  (1.626 m)   Wt 205 lb 4.8 oz (93.1 kg)   LMP 02/26/2018 (Approximate)   BMI 35.24 kg/m   General:   Normal  Skin:   normal  HEENT:  Normal  Neck:  Supple without Adenopathy or Thyromegaly  Lungs:   Heart:              Breasts:   Abdomen:  Pelvis:  M/S   Extremeties:  Neuro:    clear to  auscultation  bilaterally   Normal without murmur   Not Examined   soft, non-tender; bowel sounds normal; no masses,  no organomegaly   Exam deferred to OR  No CVAT  Warm/Dry   Normal         02/12/2018 ABDOMEN: Soft, non distended; Non tender.  No Organomegaly.  Pfannenstiel incision is well-healed without evidence of hernia PELVIC:             External Genitalia: Normal             BUS: Normal             Vagina: Normal             Cervix: Normal; parous; no lesions; cervical motion tenderness 1/4             Uterus: 8 weeks size, mobile, anterior, 2/4 tender             Adnexa: Normal; nonpalpable and nontender             RV: Normal external exam             Bladder: Nontender   Assessment:    1.  Symptomatic fibroid uterus 2.  Abnormal uterine bleeding 3.  Status post endometrial ablation   Plan:  LAVH bilateral salpingectomy  Preop counseling: Patient is to undergo LAVH bilateral salpingectomy for management of symptomatic fibroid uterus.  She is understanding of the planned procedure and is aware of and is accepting of all surgical risks which include but are not limited to bleeding, infection, pelvic organ injury with need for repair, blood clot disorders, anesthesia risk, etc.  All questions have been answered.  Informed consent is given.  Patient is very willing to proceed with surgery as scheduled.  Brayton Mars, MD  Note: This dictation was prepared with Dragon dictation along with smaller phrase technology. Any transcriptional errors that result from this process are unintentional.

## 2018-03-18 NOTE — Patient Instructions (Signed)
Your procedure is scheduled on: Monday 03/24/18 Report to [place]. To find out your arrival time please call [phone] between 1PM - 3PM on Friday 03/21/18.  Remember: Instructions that are not followed completely may result in serious medical risk, up to and including death, or upon the discretion of your surgeon and anesthesiologist your surgery may need to be rescheduled.     _X__ 1. Do not eat food after midnight the night before your procedure.                 No gum chewing or hard candies. You may drink clear liquids up to 2 hours                 before you are scheduled to arrive for your surgery- DO not drink clear                 liquids within 2 hours of the start of your surgery.                 Clear Liquids include:  water, apple juice without pulp, clear carbohydrate                 drink such as Clearfast or Gatorade, Black Coffee or Tea (Do not add                 anything to coffee or tea).  __X__2.  On the morning of surgery brush your teeth with toothpaste and water, you                 may rinse your mouth with mouthwash if you wish.  Do not swallow any              toothpaste of mouthwash.     _X__ 3.  No Alcohol for 24 hours before or after surgery.   _X__ 4.  Do Not Smoke or use e-cigarettes For 24 Hours Prior to Your Surgery.                 Do not use any chewable tobacco products for at least 6 hours prior to                 surgery.  ____  5.  Bring all medications with you on the day of surgery if instructed.   __X__  6.  Notify your doctor if there is any change in your medical condition      (cold, fever, infections).     Do not wear jewelry, make-up, hairpins, clips or nail polish. Do not wear lotions, powders, or perfumes.  Do not shave 48 hours prior to surgery. Men may shave face and neck. Do not bring valuables to the hospital.    [facility] is not responsible for any belongings or  valuables.  Contacts, dentures/partials or body piercings may not be worn into surgery. Bring a case for your contacts, glasses or hearing aids, a denture cup will be supplied. Leave your suitcase in the car. After surgery it may be brought to your room. For patients admitted to the hospital, discharge time is determined by your treatment team.   Patients discharged the day of surgery will not be allowed to drive home.   Please read over the following fact sheets that you were given:   MRSA Information  __X__ Take these medicines the morning of surgery with A SIP OF WATER:  1. none  2.   3.   4.  5.  6.  ____ Fleet Enema (as directed)   __X__ Use CHG Soap/SAGE wipes as directed  ____ Use inhalers on the day of surgery  ____ Stop metformin/Janumet/Farxiga 2 days prior to surgery    ____ Take 1/2 of usual insulin dose the night before surgery. No insulin the morning          of surgery.   ____ Stop Blood Thinners Coumadin/Plavix/Xarelto/Pleta/Pradaxa/Eliquis/Effient/Aspirin  on   Or contact your Surgeon, Cardiologist or Medical Doctor regarding  ability to stop your blood thinners  __X__ Stop Anti-inflammatories 7 days before surgery such as Advil, Ibuprofen, Motrin,  BC or Goodies Powder, Naprosyn, Naproxen, Aleve, Aspirin MAY TAKE TYLENOL INSTEAD   __X__ Stop all herbal supplements, fish oil or vitamin E until after surgery.    ____ Bring C-Pap to the hospital.

## 2018-03-18 NOTE — H&P (View-Only) (Signed)
PREOPERATIVE HISTORY AND PHYSICAL  Date of surgery: 03/24/2018 Procedure: LAVH bilateral salpingectomy Diagnoses: 1.  Symptomatic uterine fibroids 2.  Abnormal uterine bleeding 3.  Status post endometrial ablation    Patient is a 47 y.o. [id number] scheduled for surgery on 03/24/2018. Long history of chronic pelvic pain, worsening, status post hysteroscopy D&C/endometrial ablation in 07/23/2016.  Since her surgery she has had persistent cycles which have worsened recently following iron infusions for chronic anemia.  She has associated pelvic pain categorized as 5 out of 10 in intensity.  The pain is central and bilateral cramping without back pain component.  Long history of abnormal uterine bleeding, previously treated with Provera 30 mg daily, trial of IUD, without success; history of chronic endometritis in December 2016 which was treated with doxycycline twice daily for 14 days; bleeding and pain continue.  History of multi-fibroid uterus with largest fibroid measuring 3.5 cm.  Patient is desiring definitive surgery at this time due to the pain, bleeding, irritation from wearing pads chronically.  Gynecologic History Patient's last menstrual period was 02/05/2018. Menarche age 13. Pastinterval is 21-23 days with heavy moderate bleeding lasting upwards of 8 days. She needs multiple pads daily to control clots. Patient had history of worsening dysmenorrhea with radiation to back and legs over the past 4 years (prior to ablation) History of anemia from menorrhagia which has required a transfusion in the past, and most recently iron infusions.    OB History    Gravida  6   Para  4   Term  4   Preterm      AB  2   Living  4     SAB  2   TAB      Ectopic      Multiple      Live Births  4           Patient's last menstrual period was 02/26/2018 (approximate).    Past Medical History:  Diagnosis Date  . AR (allergic rhinitis)   . Bacterial vaginosis   .  Complication of anesthesia 2008   PT STATES THAT DURING C-SECTION SHE WAS ADMINISTERED A GAS THAT CAUSED HER HEART TO BEAT IRREGULAR- PT HAS HAD NO PROBLEMS WITH THIS SINCE 2008  . Dysrhythmia    DUE TO A GAS THAT WAS ADMINISTERED DURING A 2008 C-SECTION-NO PROBLEMS SINCE  . Fibroid   . GERD (gastroesophageal reflux disease)    OCC  . Hypertension   . IDA (iron deficiency anemia) 01/25/2015  . UTI (lower urinary tract infection)     Past Surgical History:  Procedure Laterality Date  . CESAREAN SECTION     X2  . DILATION AND CURETTAGE OF UTERUS    . DILITATION & CURRETTAGE/HYSTROSCOPY WITH NOVASURE ABLATION N/A 07/23/2016   Procedure: DILATATION & CURETTAGE/HYSTEROSCOPY WITH NOVASURE ABLATION;  Surgeon: Brayton Mars, MD;  Location: [facility] ORS;  Service: Gynecology;  Laterality: N/A;    OB History  Gravida Para Term Preterm AB Living  6 4 4   2 4   SAB TAB Ectopic Multiple Live Births  2       4    # Outcome Date GA Lbr Len/2nd Weight Sex Delivery Anes PTL Lv  6 SAB 2010          5 SAB 1996          4 Term 1994   6 lb (2.722 kg) M VBAC   LIV  3 Term 1992   7 lb (3.175 kg) M  CS-LTranv   LIV  2 Term 1991   6 lb (2.722 kg) M Vag-Spont   LIV  1 Term 1989   7 lb 2.2 oz (3.239 kg) F Vag-Spont   LIV    Social History   Socioeconomic History  . Marital status: Single    Spouse name: Not on file  . Number of children: Not on file  . Years of education: Not on file  . Highest education level: Not on file  Occupational History  . Not on file  Social Needs  . Financial resource strain: Not on file  . Food insecurity:    Worry: Not on file    Inability: Not on file  . Transportation needs:    Medical: Not on file    Non-medical: Not on file  Tobacco Use  . Smoking status: Current Every Day Smoker    Packs/day: 0.25    Years: 25.00    Pack years: 6.25    Types: Cigarettes    Start date: 07/03/1992  . Smokeless tobacco: Never Used  Substance and Sexual Activity  .  Alcohol use: Yes    Comment: every 2-3 weeks  . Drug use: Yes    Types: Marijuana    Comment: once a week  . Sexual activity: Yes    Birth control/protection: None    Comment: Ablation  Lifestyle  . Physical activity:    Days per week: Not on file    Minutes per session: Not on file  . Stress: Not on file  Relationships  . Social connections:    Talks on phone: Not on file    Gets together: Not on file    Attends religious service: Not on file    Active member of club or organization: Not on file    Attends meetings of clubs or organizations: Not on file    Relationship status: Not on file  Other Topics Concern  . 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 Diabetes Father   . Hypertension Father   . Colon cancer Maternal Aunt   . Breast cancer Maternal Aunt        great  . Heart disease Maternal Aunt   . Diabetes Maternal Grandmother   . Hypertension Mother   . Thyroid disease Mother   . Lung cancer Mother   . Asthma Mother   . Hypertension Brother   . Ovarian cancer Neg Hx      (Not in a hospital admission)  No Known Allergies  Review of Systems Constitutional: No recent fever/chills/sweats Respiratory: No recent cough/bronchitis Cardiovascular: No chest pain Gastrointestinal: No recent nausea/vomiting/diarrhea Genitourinary: No UTI symptoms Hematologic/lymphatic:No history of coagulopathy or recent blood thinner use    Objective:    BP (!) 144/83   Pulse 65   Ht 5\' 4"  (1.626 m)   Wt 205 lb 4.8 oz (93.1 kg)   LMP 02/26/2018 (Approximate)   BMI 35.24 kg/m   General:   Normal  Skin:   normal  HEENT:  Normal  Neck:  Supple without Adenopathy or Thyromegaly  Lungs:   Heart:              Breasts:   Abdomen:  Pelvis:  M/S   Extremeties:  Neuro:    clear to  auscultation  bilaterally   Normal without murmur   Not Examined   soft, non-tender; bowel sounds normal; no masses,  no organomegaly   Exam deferred to OR  No CVAT  Warm/Dry   Normal         02/12/2018 ABDOMEN: Soft, non distended; Non tender.  No Organomegaly.  Pfannenstiel incision is well-healed without evidence of hernia PELVIC:             External Genitalia: Normal             BUS: Normal             Vagina: Normal             Cervix: Normal; parous; no lesions; cervical motion tenderness 1/4             Uterus: 8 weeks size, mobile, anterior, 2/4 tender             Adnexa: Normal; nonpalpable and nontender             RV: Normal external exam             Bladder: Nontender   Assessment:    1.  Symptomatic fibroid uterus 2.  Abnormal uterine bleeding 3.  Status post endometrial ablation   Plan:  LAVH bilateral salpingectomy  Preop counseling: Patient is to undergo LAVH bilateral salpingectomy for management of symptomatic fibroid uterus.  She is understanding of the planned procedure and is aware of and is accepting of all surgical risks which include but are not limited to bleeding, infection, pelvic organ injury with need for repair, blood clot disorders, anesthesia risk, etc.  All questions have been answered.  Informed consent is given.  Patient is very willing to proceed with surgery as scheduled.  Brayton Mars, MD  Note: This dictation was prepared with Dragon dictation along with smaller phrase technology. Any transcriptional errors that result from this process are unintentional.

## 2018-03-19 LAB — RPR: RPR: NONREACTIVE

## 2018-03-23 MED ORDER — CEFAZOLIN SODIUM-DEXTROSE 2-4 GM/100ML-% IV SOLN
2.0000 g | INTRAVENOUS | Status: AC
  Administered 2018-03-24: 2 g via INTRAVENOUS

## 2018-03-24 ENCOUNTER — Other Ambulatory Visit: Payer: Self-pay

## 2018-03-24 ENCOUNTER — Ambulatory Visit: Payer: BLUE CROSS/BLUE SHIELD | Admitting: Anesthesiology

## 2018-03-24 ENCOUNTER — Encounter: Payer: Self-pay | Admitting: *Deleted

## 2018-03-24 ENCOUNTER — Encounter
Admission: RE | Disposition: A | Discharge status: Home / Self Care | Payer: Self-pay | Source: Ambulatory Visit | Attending: Obstetrics and Gynecology

## 2018-03-24 ENCOUNTER — Inpatient Hospital Stay: Payer: BLUE CROSS/BLUE SHIELD | Attending: Hematology and Oncology

## 2018-03-24 ENCOUNTER — Observation Stay
Admission: RE | Admit: 2018-03-24 | Discharge: 2018-03-25 | Disposition: A | Discharge status: Home / Self Care | Payer: BLUE CROSS/BLUE SHIELD | Source: Ambulatory Visit | Attending: Obstetrics and Gynecology | Admitting: Obstetrics and Gynecology

## 2018-03-24 DIAGNOSIS — G8929 Other chronic pain: Secondary | ICD-10-CM | POA: Diagnosis not present

## 2018-03-24 DIAGNOSIS — Z90710 Acquired absence of both cervix and uterus: Secondary | ICD-10-CM | POA: Diagnosis present

## 2018-03-24 DIAGNOSIS — N72 Inflammatory disease of cervix uteri: Secondary | ICD-10-CM | POA: Diagnosis not present

## 2018-03-24 DIAGNOSIS — N879 Dysplasia of cervix uteri, unspecified: Secondary | ICD-10-CM | POA: Diagnosis not present

## 2018-03-24 DIAGNOSIS — N8301 Follicular cyst of right ovary: Secondary | ICD-10-CM

## 2018-03-24 DIAGNOSIS — R102 Pelvic and perineal pain: Secondary | ICD-10-CM | POA: Insufficient documentation

## 2018-03-24 DIAGNOSIS — D251 Intramural leiomyoma of uterus: Secondary | ICD-10-CM | POA: Insufficient documentation

## 2018-03-24 DIAGNOSIS — I10 Essential (primary) hypertension: Secondary | ICD-10-CM | POA: Insufficient documentation

## 2018-03-24 DIAGNOSIS — F17210 Nicotine dependence, cigarettes, uncomplicated: Secondary | ICD-10-CM | POA: Insufficient documentation

## 2018-03-24 DIAGNOSIS — N939 Abnormal uterine and vaginal bleeding, unspecified: Secondary | ICD-10-CM | POA: Diagnosis not present

## 2018-03-24 DIAGNOSIS — D259 Leiomyoma of uterus, unspecified: Secondary | ICD-10-CM

## 2018-03-24 DIAGNOSIS — N938 Other specified abnormal uterine and vaginal bleeding: Principal | ICD-10-CM | POA: Insufficient documentation

## 2018-03-24 HISTORY — PX: LAPAROSCOPIC VAGINAL HYSTERECTOMY WITH SALPINGECTOMY: SHX6680

## 2018-03-24 LAB — URINE DRUG SCREEN, QUALITATIVE (ARMC ONLY)
Amphetamines, Ur Screen: NOT DETECTED
Benzodiazepine, Ur Scrn: NOT DETECTED
CANNABINOID 50 NG, UR ~~LOC~~: POSITIVE — AB
COCAINE METABOLITE, UR ~~LOC~~: NOT DETECTED
MDMA (Ecstasy)Ur Screen: NOT DETECTED
Methadone Scn, Ur: NOT DETECTED
Opiate, Ur Screen: NOT DETECTED
Phencyclidine (PCP) Ur S: NOT DETECTED
TRICYCLIC, UR SCREEN: NOT DETECTED

## 2018-03-24 LAB — POCT PREGNANCY, URINE: Preg Test, Ur: NEGATIVE

## 2018-03-24 SURGERY — HYSTERECTOMY, VAGINAL, LAPAROSCOPY-ASSISTED, WITH SALPINGECTOMY
Anesthesia: General | Laterality: Right | Wound class: Clean Contaminated

## 2018-03-24 MED ORDER — LACTATED RINGERS IV SOLN
INTRAVENOUS | Status: DC
  Administered 2018-03-24: 19:00:00 via INTRAVENOUS

## 2018-03-24 MED ORDER — FENTANYL CITRATE (PF) 100 MCG/2ML IJ SOLN
INTRAMUSCULAR | Status: AC
  Administered 2018-03-24: 25 ug via INTRAVENOUS
  Filled 2018-03-24: qty 2

## 2018-03-24 MED ORDER — FAMOTIDINE 20 MG PO TABS
ORAL_TABLET | ORAL | Status: AC
  Filled 2018-03-24: qty 1

## 2018-03-24 MED ORDER — SUCCINYLCHOLINE CHLORIDE 20 MG/ML IJ SOLN
INTRAMUSCULAR | Status: AC
  Filled 2018-03-24: qty 1

## 2018-03-24 MED ORDER — MIDAZOLAM HCL 2 MG/2ML IJ SOLN
INTRAMUSCULAR | Status: DC | PRN
  Administered 2018-03-24: 2 mg via INTRAVENOUS

## 2018-03-24 MED ORDER — GLYCOPYRROLATE 0.2 MG/ML IJ SOLN
INTRAMUSCULAR | Status: AC
  Filled 2018-03-24: qty 1

## 2018-03-24 MED ORDER — ACETAMINOPHEN 10 MG/ML IV SOLN
INTRAVENOUS | Status: DC | PRN
  Administered 2018-03-24: 1000 mg via INTRAVENOUS

## 2018-03-24 MED ORDER — FENTANYL CITRATE (PF) 100 MCG/2ML IJ SOLN
INTRAMUSCULAR | Status: AC
  Filled 2018-03-24: qty 2

## 2018-03-24 MED ORDER — ROCURONIUM BROMIDE 100 MG/10ML IV SOLN
INTRAVENOUS | Status: DC | PRN
  Administered 2018-03-24: 10 mg via INTRAVENOUS
  Administered 2018-03-24: 40 mg via INTRAVENOUS
  Administered 2018-03-24: 10 mg via INTRAVENOUS
  Administered 2018-03-24: 20 mg via INTRAVENOUS

## 2018-03-24 MED ORDER — HYDROMORPHONE HCL 1 MG/ML IJ SOLN
INTRAMUSCULAR | Status: AC
  Filled 2018-03-24: qty 1

## 2018-03-24 MED ORDER — ROCURONIUM BROMIDE 50 MG/5ML IV SOLN
INTRAVENOUS | Status: AC
  Filled 2018-03-24: qty 2

## 2018-03-24 MED ORDER — FAMOTIDINE 20 MG PO TABS
20.0000 mg | ORAL_TABLET | Freq: Once | ORAL | Status: AC
  Administered 2018-03-24: 20 mg via ORAL

## 2018-03-24 MED ORDER — SIMETHICONE 80 MG PO CHEW
80.0000 mg | CHEWABLE_TABLET | Freq: Three times a day (TID) | ORAL | Status: DC
  Administered 2018-03-24 – 2018-03-25 (×3): 80 mg via ORAL
  Filled 2018-03-24 (×3): qty 1

## 2018-03-24 MED ORDER — ONDANSETRON HCL 4 MG/2ML IJ SOLN
INTRAMUSCULAR | Status: AC
  Filled 2018-03-24: qty 2

## 2018-03-24 MED ORDER — DEXAMETHASONE SODIUM PHOSPHATE 10 MG/ML IJ SOLN
INTRAMUSCULAR | Status: AC
Start: 2018-03-24 — End: ?
  Filled 2018-03-24: qty 1

## 2018-03-24 MED ORDER — KETOROLAC TROMETHAMINE 30 MG/ML IJ SOLN
30.0000 mg | Freq: Four times a day (QID) | INTRAMUSCULAR | Status: DC
  Administered 2018-03-24 – 2018-03-25 (×3): 30 mg via INTRAVENOUS
  Filled 2018-03-24 (×3): qty 1

## 2018-03-24 MED ORDER — PROPOFOL 10 MG/ML IV BOLUS
INTRAVENOUS | Status: DC | PRN
  Administered 2018-03-24: 180 mg via INTRAVENOUS
  Administered 2018-03-24: 20 mg via INTRAVENOUS

## 2018-03-24 MED ORDER — PROPOFOL 10 MG/ML IV BOLUS
INTRAVENOUS | Status: AC
  Filled 2018-03-24: qty 20

## 2018-03-24 MED ORDER — SUCCINYLCHOLINE CHLORIDE 20 MG/ML IJ SOLN
INTRAMUSCULAR | Status: DC | PRN
  Administered 2018-03-24: 100 mg via INTRAVENOUS

## 2018-03-24 MED ORDER — MIDAZOLAM HCL 2 MG/2ML IJ SOLN
INTRAMUSCULAR | Status: AC
  Filled 2018-03-24: qty 2

## 2018-03-24 MED ORDER — DOCUSATE SODIUM 100 MG PO CAPS
100.0000 mg | ORAL_CAPSULE | Freq: Two times a day (BID) | ORAL | Status: DC
  Administered 2018-03-24 – 2018-03-25 (×2): 100 mg via ORAL
  Filled 2018-03-24 (×2): qty 1

## 2018-03-24 MED ORDER — ACETAMINOPHEN NICU IV SYRINGE 10 MG/ML
INTRAVENOUS | Status: AC
  Filled 2018-03-24: qty 1

## 2018-03-24 MED ORDER — KETOROLAC TROMETHAMINE 30 MG/ML IJ SOLN
INTRAMUSCULAR | Status: DC | PRN
  Administered 2018-03-24: 30 mg via INTRAVENOUS

## 2018-03-24 MED ORDER — LIDOCAINE HCL (CARDIAC) PF 100 MG/5ML IV SOSY
PREFILLED_SYRINGE | INTRAVENOUS | Status: DC | PRN
  Administered 2018-03-24: 100 mg via INTRAVENOUS

## 2018-03-24 MED ORDER — DEXAMETHASONE SODIUM PHOSPHATE 10 MG/ML IJ SOLN
INTRAMUSCULAR | Status: DC | PRN
Start: 2018-03-24 — End: 2018-03-24
  Administered 2018-03-24: 10 mg via INTRAVENOUS

## 2018-03-24 MED ORDER — GLYCOPYRROLATE 0.2 MG/ML IJ SOLN
INTRAMUSCULAR | Status: DC | PRN
  Administered 2018-03-24: 0.2 mg via INTRAVENOUS

## 2018-03-24 MED ORDER — LIDOCAINE HCL (PF) 2 % IJ SOLN
INTRAMUSCULAR | Status: AC
  Filled 2018-03-24: qty 10

## 2018-03-24 MED ORDER — SUGAMMADEX SODIUM 200 MG/2ML IV SOLN
INTRAVENOUS | Status: AC
  Filled 2018-03-24: qty 2

## 2018-03-24 MED ORDER — KETOROLAC TROMETHAMINE 30 MG/ML IJ SOLN
INTRAMUSCULAR | Status: AC
  Filled 2018-03-24: qty 1

## 2018-03-24 MED ORDER — LACTATED RINGERS IV SOLN
INTRAVENOUS | Status: DC
  Administered 2018-03-24 (×2): via INTRAVENOUS

## 2018-03-24 MED ORDER — FENTANYL CITRATE (PF) 100 MCG/2ML IJ SOLN
INTRAMUSCULAR | Status: DC | PRN
  Administered 2018-03-24 (×4): 50 ug via INTRAVENOUS
  Administered 2018-03-24: 100 ug via INTRAVENOUS

## 2018-03-24 MED ORDER — ONDANSETRON HCL 4 MG/2ML IJ SOLN: 4.0000 mg | Freq: Four times a day (QID) | INTRAMUSCULAR | Status: DC | PRN

## 2018-03-24 MED ORDER — HYDROMORPHONE HCL 1 MG/ML IJ SOLN
INTRAMUSCULAR | Status: DC | PRN
  Administered 2018-03-24: 1 mg via INTRAVENOUS

## 2018-03-24 MED ORDER — KETOROLAC TROMETHAMINE 30 MG/ML IJ SOLN: 30.0000 mg | Freq: Four times a day (QID) | INTRAMUSCULAR | Status: DC

## 2018-03-24 MED ORDER — SUGAMMADEX SODIUM 200 MG/2ML IV SOLN
INTRAVENOUS | Status: DC | PRN
  Administered 2018-03-24: 200 mg via INTRAVENOUS

## 2018-03-24 MED ORDER — CEFAZOLIN SODIUM-DEXTROSE 2-4 GM/100ML-% IV SOLN
INTRAVENOUS | Status: AC
  Filled 2018-03-24: qty 100

## 2018-03-24 MED ORDER — ONDANSETRON HCL 4 MG/2ML IJ SOLN
INTRAMUSCULAR | Status: DC | PRN
  Administered 2018-03-24: 4 mg via INTRAVENOUS

## 2018-03-24 MED ORDER — FENTANYL CITRATE (PF) 100 MCG/2ML IJ SOLN
25.0000 ug | INTRAMUSCULAR | Status: AC | PRN
  Administered 2018-03-24 (×6): 25 ug via INTRAVENOUS

## 2018-03-24 MED ORDER — MORPHINE SULFATE (PF) 2 MG/ML IV SOLN
1.0000 mg | INTRAVENOUS | Status: DC | PRN
  Administered 2018-03-24: 2 mg via INTRAVENOUS
  Administered 2018-03-24: 1 mg via INTRAVENOUS
  Administered 2018-03-25 (×2): 2 mg via INTRAVENOUS
  Filled 2018-03-24 (×4): qty 1

## 2018-03-24 MED ORDER — ONDANSETRON HCL 4 MG/2ML IJ SOLN: 4.0000 mg | Freq: Once | INTRAMUSCULAR | Status: DC | PRN

## 2018-03-24 SURGICAL SUPPLY — 48 items
APPLICATOR ARISTA FLEXITIP XL (MISCELLANEOUS) ×3 IMPLANT
BAG URINE DRAINAGE (UROLOGICAL SUPPLIES) ×3 IMPLANT
BLADE SURG SZ10 CARB STEEL (BLADE) ×6 IMPLANT
BLADE SURG SZ11 CARB STEEL (BLADE) ×3 IMPLANT
CATH FOLEY 2WAY  5CC 16FR (CATHETERS) ×1
CATH URTH 16FR FL 2W BLN LF (CATHETERS) ×2 IMPLANT
CHLORAPREP W/TINT 26ML (MISCELLANEOUS) ×3 IMPLANT
CORD MONOPOLAR M/FML 12FT (MISCELLANEOUS) ×3 IMPLANT
DERMABOND ADVANCED (GAUZE/BANDAGES/DRESSINGS) ×1
DERMABOND ADVANCED .7 DNX12 (GAUZE/BANDAGES/DRESSINGS) ×2 IMPLANT
DRSG TEGADERM 2-3/8X2-3/4 SM (GAUZE/BANDAGES/DRESSINGS) ×9 IMPLANT
ELECT BLADE 6.5 EXT (BLADE) ×3 IMPLANT
ELECT REM PT RETURN 9FT ADLT (ELECTROSURGICAL) ×3
ELECTRODE REM PT RTRN 9FT ADLT (ELECTROSURGICAL) ×2 IMPLANT
GAUZE PETRO XEROFOAM 1X8 (MISCELLANEOUS) ×3 IMPLANT
GLOVE BIO SURGEON STRL SZ 6.5 (GLOVE) ×6 IMPLANT
GLOVE BIO SURGEON STRL SZ8 (GLOVE) ×15 IMPLANT
GLOVE INDICATOR 7.0 STRL GRN (GLOVE) ×15 IMPLANT
GOWN STRL REUS W/ TWL LRG LVL3 (GOWN DISPOSABLE) ×4 IMPLANT
GOWN STRL REUS W/ TWL XL LVL3 (GOWN DISPOSABLE) ×2 IMPLANT
GOWN STRL REUS W/TWL LRG LVL3 (GOWN DISPOSABLE) ×2
GOWN STRL REUS W/TWL XL LVL3 (GOWN DISPOSABLE) ×1
HEMOSTAT ARISTA ABSORB 3G PWDR (MISCELLANEOUS) ×3 IMPLANT
IRRIGATION STRYKERFLOW (MISCELLANEOUS) ×2 IMPLANT
IRRIGATOR STRYKERFLOW (MISCELLANEOUS) ×3
IV LACTATED RINGERS 1000ML (IV SOLUTION) ×3 IMPLANT
KIT PINK PAD W/HEAD ARE REST (MISCELLANEOUS) ×3
KIT PINK PAD W/HEAD ARM REST (MISCELLANEOUS) ×2 IMPLANT
KIT TURNOVER CYSTO (KITS) ×3 IMPLANT
LABEL OR SOLS (LABEL) ×3 IMPLANT
PACK BASIN MINOR ARMC (MISCELLANEOUS) ×3 IMPLANT
PACK GYN LAPAROSCOPIC (MISCELLANEOUS) ×3 IMPLANT
PAD OB MATERNITY 4.3X12.25 (PERSONAL CARE ITEMS) ×3 IMPLANT
PENCIL ELECTRO HAND CTR (MISCELLANEOUS) ×3 IMPLANT
SCISSORS METZENBAUM CVD 33 (INSTRUMENTS) IMPLANT
SHEARS HARMONIC ACE PLUS 36CM (ENDOMECHANICALS) ×3 IMPLANT
SLEEVE ENDOPATH XCEL 5M (ENDOMECHANICALS) ×6 IMPLANT
SUT CHROMIC 2 0 CT 1 (SUTURE) ×15 IMPLANT
SUT MNCRL 4-0 (SUTURE) ×1
SUT MNCRL 4-0 27XMFL (SUTURE) ×2
SUT VIC AB 0 CT1 27 (SUTURE) ×2
SUT VIC AB 0 CT1 27XCR 8 STRN (SUTURE) ×4 IMPLANT
SUT VIC AB 0 CT1 36 (SUTURE) ×6 IMPLANT
SUTURE MNCRL 4-0 27XMF (SUTURE) ×2 IMPLANT
SYR 10ML LL (SYRINGE) ×3 IMPLANT
TAPE TRANSPORE STRL 2 31045 (GAUZE/BANDAGES/DRESSINGS) ×3 IMPLANT
TROCAR XCEL NON-BLD 5MMX100MML (ENDOMECHANICALS) ×3 IMPLANT
TUBING INSUF HEATED (TUBING) ×3 IMPLANT

## 2018-03-24 NOTE — Anesthesia Procedure Notes (Signed)
Procedure Name: Intubation Date/Time: 03/24/2018 9:21 AM Performed by: Aline Brochure, CRNA Pre-anesthesia Checklist: Patient identified, Emergency Drugs available, Suction available and Patient being monitored Patient Re-evaluated:Patient Re-evaluated prior to induction Oxygen Delivery Method: Circle system utilized Preoxygenation: Pre-oxygenation with 100% oxygen Induction Type: IV induction Ventilation: Mask ventilation without difficulty Laryngoscope Size: McGraph and 4 Grade View: Grade I Tube type: Oral Tube size: 7.5 mm Number of attempts: 2 Airway Equipment and Method: Video-laryngoscopy and Stylet Placement Confirmation: ETT inserted through vocal cords under direct vision,  positive ETCO2 and breath sounds checked- equal and bilateral Secured at: 21 cm Tube secured with: Tape Dental Injury: Teeth and Oropharynx as per pre-operative assessment  Difficulty Due To: Difficult Airway- due to anterior larynx and Difficult Airway- due to dentition

## 2018-03-24 NOTE — Transfer of Care (Signed)
Immediate Anesthesia Transfer of Care Note  Patient: Jill Davidson  Procedure(s) Performed: LAPAROSCOPIC ASSISTED VAGINAL HYSTERECTOMY WITH BILATERAL SALPINGECTOMY (Bilateral ) LAPAROSCOPIC OOPHORECTOMY (Right )  Patient Location: PACU  Anesthesia Type:General  Level of Consciousness: awake, alert , oriented and patient cooperative  Airway & Oxygen Therapy: Patient Spontanous Breathing and Patient connected to face mask oxygen  Post-op Assessment: Report given to RN, Post -op Vital signs reviewed and stable and Patient moving all extremities  Post vital signs: Reviewed and stable  Last Vitals:  Vitals Value Taken Time  BP    Temp    Pulse 65 03/24/2018 12:42 PM  Resp    SpO2 100 % 03/24/2018 12:42 PM  Vitals shown include unvalidated device data.  Last Pain:  Vitals:   03/24/18 0737  TempSrc: Oral  PainSc: 0-No pain         Complications: No apparent anesthesia complications

## 2018-03-24 NOTE — Anesthesia Preprocedure Evaluation (Addendum)
Anesthesia Evaluation  Patient identified by MRN, date of birth, ID band Patient awake    Reviewed: Allergy & Precautions, NPO status , Patient's Chart, lab work & pertinent test results, reviewed documented beta blocker date and time   Airway Mallampati: III  TM Distance: >3 FB     Dental  (+) Chipped   Pulmonary Current Smoker,           Cardiovascular hypertension, Pt. on medications      Neuro/Psych    GI/Hepatic GERD  Controlled,  Endo/Other    Renal/GU      Musculoskeletal   Abdominal   Peds  Hematology  (+) anemia ,   Anesthesia Other Findings Obese. Had some irregular rythms with c-section in the past.  Reproductive/Obstetrics                             Anesthesia Physical Anesthesia Plan  ASA: III  Anesthesia Plan: General   Post-op Pain Management:    Induction: Intravenous  PONV Risk Score and Plan:   Airway Management Planned: Oral ETT  Additional Equipment:   Intra-op Plan:   Post-operative Plan:   Informed Consent: I have reviewed the patients History and Physical, chart, labs and discussed the procedure including the risks, benefits and alternatives for the proposed anesthesia with the patient or authorized representative who has indicated his/her understanding and acceptance.     Plan Discussed with: CRNA  Anesthesia Plan Comments:         Anesthesia Quick Evaluation

## 2018-03-24 NOTE — Anesthesia Post-op Follow-up Note (Signed)
Anesthesia QCDR form completed.        

## 2018-03-24 NOTE — Op Note (Signed)
OPERATIVE NOTE:  Jill Davidson  PROCEDURE DATE: 03/24/2018   PREOPERATIVE DIAGNOSIS: 1.  Abnormal uterine bleeding 2.  Leiomyoma uteri 3.  Status post NovaSure endometrial ablation  POSTOPERATIVE DIAGNOSIS: Same as above  PROCEDURE: LAVH bilateral salpingectomy (with morcellation) and right oophorectomy SURGEON:  Brayton Mars, MD ASSISTANTS: Ave Filter, PA-S and Dr. Marcelline Mates ANESTHESIA: General INDICATIONS: 47 y.o. Jill Davidson with long history of uterine fibroids and chronic abnormal uterine bleeding which have been refractory to both medical and surgical therapies.  She is status post NovaSure ablation.  Medical therapies have been effective in controlling bleeding.  Patient has chronic anemia secondary to chronic bleeding.  She desires surgery at this time  FINDINGS: 10 to 12 weeks as globular and myomatous uterus; left ovary containing simple ovarian cyst; filmy adhesions were noted in the right adnexa prompting additional right oophorectomy   I/O's: Total I/O In: 1200 [I.V.:1200] Out: 800 [Urine:500; Blood:300] COUNTS:  YES SPECIMENS: Uterus; bilateral fallopian tubes; right ovary ANTIBIOTIC PROPHYLAXIS:Ancef 2 grams COMPLICATIONS: None immediate  PROCEDURE IN DETAIL: Patient was brought to the operating room placed in supine position.  General endotracheal anesthesia was induced without difficulty.  She was placed in the dorsolithotomy position using the bumblebee stirrups.  A ChloraPrep abdominal and Hibiclens perineal and intravaginal prep and drape was performed in standard fashion. Timeout was completed. Hulka tenaculum was placed onto the cervix to facilitate uterine manipulation intraoperatively.  A Foley catheter was placed into the bladder was draining clear yellow urine. Subumbilical vertical incision 5 mm in length was made.  The Optiview laparoscopic trocar system was used to place the camera directly to the abdominal pelvic cavity without evidence of bowel or  vascular injury.  2 additional 5 mm ports were placed in the right and left lower quadrants respectively under direct visualization. Hysterectomy was then performed in standard fashion.  The round ligaments were clamped coagulated and cut using the Ace harmonic scalpel.  This instrument along with Kleppinger bipolar forceps and graspers were used throughout the procedure.  The right adnexa was enmeshed in adhesions and decision was made to remove the right fallopian tube and ovary.  The infundibulopelvic ligament was clamped coagulated and cut using the Ace harmonic scalpel.  The mesosalpinx attachments were likewise clamped coagulated and cut using the Ace harmonic scalpel.  Once removed from the adnexa the right fallopian tube and ovary were placed into the cul-de-sac, for removal at colpotomy.  The bladder flap was created using sharp and blunt dissection  with the aid of the Ace harmonic scalpel.  The uterine arteries were skeletonized, then coagulated with Kleppinger bipolar forceps and cut with the Ace harmonic scalpel.  On the contralateral side the left ovary was preserved.  The mesosalpinx was grasped coagulated and cut with the Ace harmonic scalpel.  Sequentially this was carried out throughout the length of attachment, after which the fallopian tube was resected from the uterus and placed into the posterior cul-de-sac for removal, at the time of colpotomy.  Similarly on the left side the uterine artery was skeletonized and then coagulated with Kleppinger bipolar forceps.  The vessel was then cut with the Ace harmonic scalpel.  Following complete mobilization of the bladder in standard fashion, procedure was then turned towards the vagina to accomplish the remainder of the surgery. Weighted speculum was placed into the vagina.  The Hulka tenaculum was removed and a double-tooth tenaculum was placed onto the cervix to facilitate manipulation intraoperatively.  Posterior colpotomy was made with [facility]  scissors.  Uterosacral ligaments were clamped cut and stick tied using 0 Vicryl suture.  Additional cardinal/ broad ligament complexes were clamped coagulated and cut bilaterally.  The vaginal mucosa  was cut over the anterior aspect of the cervix and the vaginal mucosa and bladder were dissected off of the uterus.  Eventually the anterior cul-de-sac was entered.  Because of the bulky nature of the uterus the specimen could not be removed directly through the vagina, and morcellation had then had to be performed.  Using a scalpel, a coring technique was performed in order to reduce the bulky nature of the specimen.  Once adequately debulked, the remainder of the specimen was removed from the vagina.  The posterior cuff was closed with a baseball stitch of 0 Vicryl suture.  The vagina was closed with simple interrupted sutures of 2-0 chromic. Repeat laparoscopy was then performed to assess for hemostasis of all pedicles.  An arterial bleeder on the  left side was controlled with Kleppinger bipolar forceps.  The pelvis was irrigated.  The irrigant fluid was aspirated.  Arista 3 g was then applied to the surgical bed to optimize hemostasis.  Once satisfied with inspection of the abdomen and pelvis, the procedure was terminated with all instrumentation being removed from the pelvis.  The pneumoperitoneum was released.  The skin incisions were closed with Dermabond glue.  Patient was then awakened mobilized and taken to recovery in satisfactory condition. Please note that Dr. Marcelline Mates was the first assistant during this case.  Her expertise was needed due to the lack of availability of a  first assistant for this complex procedure.  Ferne Ellingwood A. Zipporah Plants, MD, ACOG [facility]

## 2018-03-24 NOTE — Interval H&P Note (Signed)
History and Physical Interval Note:  03/24/2018 8:16 AM  Jill Davidson  has presented today for surgery, with the diagnosis of UTERINE LEIOMYOMA, S/P ENDOMETRIAL ABLATION, AUB, PELVIC PAIN  The various methods of treatment have been discussed with the patient and family. After consideration of risks, benefits and other options for treatment, the patient has consented to  Procedure(s): LAPAROSCOPIC ASSISTED VAGINAL HYSTERECTOMY WITH BILATERAL SALPINGECTOMY (Bilateral) as a surgical intervention .  The patient's history has been reviewed, patient examined, no change in status, stable for surgery.  I have reviewed the patient's chart and labs.  Questions were answered to the patient's satisfaction.     Hassell Done A [name]

## 2018-03-24 NOTE — Anesthesia Postprocedure Evaluation (Signed)
Anesthesia Post Note  Patient: Jill Davidson  Procedure(s) Performed: LAPAROSCOPIC ASSISTED VAGINAL HYSTERECTOMY WITH BILATERAL SALPINGECTOMY (Bilateral ) LAPAROSCOPIC OOPHORECTOMY (Right )  Patient location during evaluation: PACU Anesthesia Type: General Level of consciousness: awake and alert and oriented Pain management: pain level controlled Vital Signs Assessment: post-procedure vital signs reviewed and stable Respiratory status: spontaneous breathing Cardiovascular status: blood pressure returned to baseline Anesthetic complications: no     Last Vitals:  Vitals:   03/24/18 1416 03/24/18 1500  BP: (!) 153/76 (!) 148/74  Pulse: 67 77  Resp: 18 18  Temp: 36.7 C 36.9 C  SpO2: 99% 99%    Last Pain:  Vitals:   03/24/18 1500  TempSrc: Oral  PainSc:                  Corwin Kuiken

## 2018-03-25 DIAGNOSIS — R102 Pelvic and perineal pain: Secondary | ICD-10-CM | POA: Diagnosis not present

## 2018-03-25 DIAGNOSIS — D251 Intramural leiomyoma of uterus: Secondary | ICD-10-CM | POA: Diagnosis not present

## 2018-03-25 DIAGNOSIS — F17210 Nicotine dependence, cigarettes, uncomplicated: Secondary | ICD-10-CM | POA: Diagnosis not present

## 2018-03-25 DIAGNOSIS — N72 Inflammatory disease of cervix uteri: Secondary | ICD-10-CM | POA: Diagnosis not present

## 2018-03-25 DIAGNOSIS — I10 Essential (primary) hypertension: Secondary | ICD-10-CM | POA: Diagnosis not present

## 2018-03-25 DIAGNOSIS — G8929 Other chronic pain: Secondary | ICD-10-CM | POA: Diagnosis not present

## 2018-03-25 DIAGNOSIS — N938 Other specified abnormal uterine and vaginal bleeding: Secondary | ICD-10-CM | POA: Diagnosis not present

## 2018-03-25 LAB — HEMOGLOBIN: HEMOGLOBIN: 12.2 g/dL (ref 12.0–16.0)

## 2018-03-25 LAB — SURGICAL PATHOLOGY

## 2018-03-25 MED ORDER — HYDROMORPHONE HCL 2 MG PO TABS
2.0000 mg | ORAL_TABLET | ORAL | Status: DC | PRN
  Filled 2018-03-25: qty 1

## 2018-03-25 MED ORDER — ACETAMINOPHEN 500 MG PO TABS: 1000.0000 mg | ORAL_TABLET | Freq: Four times a day (QID) | ORAL | 1 refills | Status: DC

## 2018-03-25 MED ORDER — ACETAMINOPHEN 500 MG PO TABS
1000.0000 mg | ORAL_TABLET | Freq: Four times a day (QID) | ORAL | Status: DC
  Administered 2018-03-25: 1000 mg via ORAL
  Filled 2018-03-25: qty 2

## 2018-03-25 MED ORDER — IBUPROFEN 600 MG PO TABS: 600.0000 mg | ORAL_TABLET | Freq: Four times a day (QID) | ORAL | 0 refills | Status: DC

## 2018-03-25 MED ORDER — IBUPROFEN 600 MG PO TABS
600.0000 mg | ORAL_TABLET | Freq: Four times a day (QID) | ORAL | Status: DC
  Administered 2018-03-25: 600 mg via ORAL
  Filled 2018-03-25: qty 1

## 2018-03-25 MED ORDER — HYDROMORPHONE HCL 2 MG PO TABS: 2.0000 mg | ORAL_TABLET | ORAL | 0 refills | Status: DC | PRN

## 2018-03-25 NOTE — Progress Notes (Signed)
Patient discharged home. Discharge instructions, prescriptions and follow up appointment given to and reviewed with patient. Patient verbalized understanding.

## 2018-03-25 NOTE — Discharge Summary (Signed)
Physician Discharge Summary  Patient ID: Jill Davidson MRN: [id number] DOB/AGE: [birth year] 47 y.o.  Admit date: 03/24/2018 Discharge date: 03/25/2018  Admission Diagnoses: Bleeding, status post endometrial ablation, and Fibroids  Discharge Diagnoses:  Same as above and status post LAVH bilateral right oophorectomy  Operative Procedures: Procedure(s): LAPAROSCOPIC ASSISTED VAGINAL HYSTERECTOMY WITH BILATERAL SALPINGECTOMY (Bilateral) LAPAROSCOPIC OOPHORECTOMY (Right)  Hospital Course: Uncomplicated .   Significant Diagnostic Studies:  Lab Results  Component Value Date   HGB 12.2 03/25/2018   HGB 14.3 03/18/2018   HGB 14.4 02/24/2018   Lab Results  Component Value Date   HCT 42.1 03/18/2018   HCT 42.3 02/24/2018   HCT 42.5 01/16/2018   CBC Latest Ref Rng & Units 03/25/2018 03/18/2018 02/24/2018  WBC 3.6 - 11.0 K/uL - 11.4(H) 10.7(H)  Hemoglobin 12.0 - 16.0 g/dL 12.2 14.3 14.4  Hematocrit 35.0 - 47.0 % - 42.1 42.3  Platelets 150 - 440 K/uL - 277 262     Discharged Condition: good  Discharge Exam: Blood pressure 127/69, pulse 63, temperature 98.2 F (36.8 C), temperature source Oral, resp. rate 18, height 5\' 3"  (1.6 m), weight 205 lb (93 kg), last menstrual period 03/21/2018, SpO2 98 %. Incision/Wound: clean, dry and no drainage  Disposition: Home with postop precautions   Allergies as of 03/25/2018   No Known Allergies     Medication List    TAKE these medications   acetaminophen 500 MG tablet Commonly known as:  TYLENOL Take 2 tablets (1,000 mg total) by mouth 4 (four) times daily. What changed:    medication strength  how much to take  when to take this  reasons to take this   fluticasone 50 MCG/ACT nasal spray Commonly known as:  FLONASE Place 2 sprays into both nostrils daily. What changed:    when to take this  reasons to take this   HYDROmorphone 2 MG tablet Commonly known as:  DILAUDID Take 1 tablet (2 mg total) by mouth every 4 (four) hours  as needed for moderate pain or severe pain.   ibuprofen 600 MG tablet Commonly known as:  ADVIL,MOTRIN Take 1 tablet (600 mg total) by mouth 4 (four) times daily.   losartan 50 MG tablet Commonly known as:  COZAAR Take 50 mg by mouth daily. Take with 25 mg dose to equal 75   losartan 25 MG tablet Commonly known as:  COZAAR Take 25 mg by mouth daily. Take with 50 mg dose to equal 75   montelukast 10 MG tablet Commonly known as:  SINGULAIR Take 1 tablet (10 mg total) by mouth daily as needed (for allergies.).   Vitamin D (Ergocalciferol) 50000 units Caps capsule Commonly known as:  DRISDOL Take 50,000 Units by mouth every 30 (thirty) days.      Follow-up Information    Ellizabeth Dacruz, Alanda Slim, MD. Go in 1 week(s).   Specialties:  Obstetrics and Gynecology, Radiology Why:  Post Op check Contact information: [place] [address] [phone]           Signed: Alanda Slim Vikas Wegmann 03/25/2018, 7:50 AM

## 2018-04-01 ENCOUNTER — Ambulatory Visit (INDEPENDENT_AMBULATORY_CARE_PROVIDER_SITE_OTHER): Payer: BLUE CROSS/BLUE SHIELD | Admitting: Obstetrics and Gynecology

## 2018-04-01 ENCOUNTER — Encounter: Payer: Self-pay | Admitting: Obstetrics and Gynecology

## 2018-04-01 VITALS — BP 150/95 | HR 71 | Ht 63.0 in | Wt 207.2 lb

## 2018-04-01 DIAGNOSIS — Z09 Encounter for follow-up examination after completed treatment for conditions other than malignant neoplasm: Secondary | ICD-10-CM

## 2018-04-01 DIAGNOSIS — Z90710 Acquired absence of both cervix and uterus: Secondary | ICD-10-CM

## 2018-04-01 NOTE — Patient Instructions (Signed)
1.  Return for final postop check in 5 weeks. 2.  Continue with postop precautions; do not insert anything intravaginal for another 5 weeks.

## 2018-04-01 NOTE — Progress Notes (Signed)
Chief complaint: 1.  1 week postop check 2.  Status post LAVH bilateral salpingectomy and right oophorectomy 03/24/2018  Pathology: DIAGNOSIS:  A. UTERUS WITH CERVIX, BILATERAL FALLOPIAN TUBES AND RIGHT OVARY; TOTAL  HYSTERECTOMY WITH BILATERAL SALPINGECTOMY AND RIGHT OOPHORECTOMY:  - CHRONIC CERVICITIS WITH SQUAMOUS METAPLASIA.  - WEAKLY PROLIFERATIVE ENDOMETRIUM, STATUS POST ABLATION.  - MYOMETRIUM WITH INTRAMURAL LEIOMYOMATA, LARGEST MEASURING 2.1 CM.  - OVARY WITH CYSTIC FOLLICLE AND PROMINENT THERMAL ARTIFACT.  - TWO UNREMARKABLE FALLOPIAN TUBES; UNREMARKABLE FIMBRIA.  - NEGATIVE FOR ATYPIA AND MALIGNANCY.  Patient reports no major discomforts at this time.  She is only using occasional analgesics-Tylenol.  She is not using any nonsteroidals because her nephrologist recommends her to avoid them.  Bowel function is normal.  Bladder function is notable for occasional stress incontinence; no dysuria, urgency, or frequency  OBJECTIVE: BP (!) 150/95   Pulse 71   Ht 5\' 3"  (1.6 m)   Wt 207 lb 3.2 oz (94 kg)   LMP 03/21/2018 (Exact Date)   BMI 36.70 kg/m   Well-appearing female no acute distress.  Alert and oriented. Abdomen: Soft, nontender; laparoscopy incisions are well approximated without evidence of hernia or erythema or discharge Pelvic: Deferred  ASSESSMENT: 1.  Normal postop check 1 week status post LAVH bilateral salpingectomy and right oophorectomy  PLAN: 1.  Continue with routine postop precautions 2.  Return in 5 weeks for final postop check  Brayton Mars, MD  Note: This dictation was prepared with Dragon dictation along with smaller phrase technology. Any transcriptional errors that result from this process are unintentional.

## 2018-04-21 NOTE — Progress Notes (Signed)
[facility]  [phone] [phone]  Clinic Follow up Note   Patient Care Team: Steele Sizer, MD as PCP - General (Family Medicine) 04/24/2018  Chief Complaint: Follow up on IDA  History of Present Illness: (According to Dr. Lebron Conners on 01/21/2018) Jill Davidson 47 y.o. presenting to the [facility] for iron deficiency anemia referred byDr Steele Sizer.Patient's past medical history significant for hypertension, obesity, and history of uterine fibroids with pathological bleeding. Family history significant for maternal aunt with colon cancer, maternal great aunt with breast cancer, and mother with lung cancer. No history of ovarian cancer in the family.  Patient has iron deficiency anem which she tolerated without complications.  In the interim, she is feeling a lot better with improving energy and improvement in the generalized body aches complained of previously.  Patient had a viral illness in early May and is recovering from it.  PREVIOUS THERAPY: -IV ferric gluconate 125mg  weekly in 2014, and 10/24/2017-12/24/2017   CURRENT THERAPY: iv iron as needed    INTERVAL HISTORY: Jill Davidson is a 47 y.o. female who is here for follow up on IDA. She used to see Dr. Lebron Conners and seeing me at the clinic for the first time today. She has history of uterine fibroids, for which she had Laparoscopic vaginal hysterectomy with bilateral salpingectomy on 03/24/2018 with Dr. Enzo Bi.  She is here today alone. She used to have heavy and irregular menses. She is now recovering well after surgery. She was on iron pill after giving birth. She states that she didn't respond to it and it only made her constipated. She, however, responded nicely to IV iron and says that it improved her symptoms. She still feels fatigued at times and sleep a lot. She used to see a nephrologist for her BP and suspected kidney failure. She says that her BP started elevating only after  starting IV iron.  She smokes one pack in 3 days. She smokes half a cigerate a day.   REVIEW OF SYSTEMS:   Constitutional: Denies fevers, chills or abnormal weight loss (+) fatigue  Eyes: Denies blurriness of vision Ears, nose, mouth, throat, and face: Denies mucositis or sore throat Respiratory: Denies cough, dyspnea or wheezes Cardiovascular: Denies palpitation, chest discomfort or lower extremity swelling Gastrointestinal:  Denies nausea, heartburn or change in bowel habits OB/GYN: (+) hysterectomy  Skin: Denies abnormal skin rashes Lymphatics: Denies new lymphadenopathy or easy bruising Neurological:Denies numbness, tingling or new weaknesses Behavioral/Psych: Mood is stable, no new changes  All other systems were reviewed with the patient and are negative.  MEDICAL HISTORY:  Past Medical History:  Diagnosis Date  . AR (allergic rhinitis)   . Bacterial vaginosis   . Complication of anesthesia 2008   PT STATES THAT DURING C-SECTION SHE WAS ADMINISTERED A GAS THAT CAUSED HER HEART TO BEAT IRREGULAR- PT HAS HAD NO PROBLEMS WITH THIS SINCE 2008  . Dysrhythmia    DUE TO A GAS THAT WAS ADMINISTERED DURING A 2008 C-SECTION-NO PROBLEMS SINCE  . Fibroid   . GERD (gastroesophageal reflux disease)    OCC  . Hypertension   . IDA (iron deficiency anemia) 01/25/2015  . UTI (lower urinary tract infection)     SURGICAL HISTORY: Past Surgical History:  Procedure Laterality Date  . CESAREAN SECTION     X2  . DILATION AND CURETTAGE OF UTERUS    . DILITATION & CURRETTAGE/HYSTROSCOPY WITH NOVASURE ABLATION N/A 07/23/2016   Procedure: DILATATION & CURETTAGE/HYSTEROSCOPY WITH NOVASURE ABLATION;  Surgeon: Brayton Mars, MD;  Location: [facility];  Service: Gynecology;  Laterality: N/A;  . LAPAROSCOPIC VAGINAL HYSTERECTOMY WITH SALPINGECTOMY Bilateral 03/24/2018   Procedure: LAPAROSCOPIC ASSISTED VAGINAL HYSTERECTOMY WITH BILATERAL SALPINGECTOMY;  Surgeon: Brayton Mars, MD;   Location: [facility];  Service: Gynecology;  Laterality: Bilateral;    I have reviewed the social history and family history with the patient and they are unchanged from previous note.  ALLERGIES:  has No Known Allergies.  MEDICATIONS:  Current Outpatient Medications  Medication Sig Dispense Refill  . acetaminophen (TYLENOL) 500 MG tablet Take 2 tablets (1,000 mg total) by mouth 4 (four) times daily. 56 tablet 1  . fluticasone (FLONASE) 50 MCG/ACT nasal spray Place 2 sprays into both nostrils daily. (Patient taking differently: Place 2 sprays into both nostrils daily as needed for allergies or rhinitis. ) 16 g 6  . losartan (COZAAR) 25 MG tablet Take 25 mg by mouth daily. Take with 50 mg dose to equal 75  3  . montelukast (SINGULAIR) 10 MG tablet Take 1 tablet (10 mg total) by mouth daily as needed (for allergies.). 90 tablet 1  . Vitamin D, Ergocalciferol, (DRISDOL) 50000 units CAPS capsule Take 50,000 Units by mouth every 30 (thirty) days.   0  . HYDROmorphone (DILAUDID) 2 MG tablet Take 1 tablet (2 mg total) by mouth every 4 (four) hours as needed for moderate pain or severe pain. (Patient not taking: Reported on 04/24/2018) 15 tablet 0   No current facility-administered medications for this visit.     PHYSICAL EXAMINATION: ECOG PERFORMANCE STATUS: 1 - Symptomatic but completely ambulatory  Vitals:   04/24/18 0925  BP: (!) 143/96  Pulse: 67  Resp: 18  Temp: 98 F (36.7 C)  SpO2: 99%   Filed Weights   04/24/18 0925  Weight: 206 lb 8 oz (93.7 kg)    GENERAL:alert, no distress and comfortable SKIN: skin color, texture, turgor are normal, no rashes or significant lesions EYES: normal, Conjunctiva are pink and non-injected, sclera clear OROPHARYNX:no exudate, no erythema and lips, buccal mucosa, and tongue normal  NECK: supple, thyroid normal size, non-tender, without nodularity LYMPH:  no palpable lymphadenopathy in the cervical, axillary or inguinal LUNGS: clear to auscultation  and percussion with normal breathing effort HEART: regular rate & rhythm and no murmurs and no lower extremity edema ABDOMEN:abdomen soft, non-tender and normal bowel sounds Musculoskeletal:no cyanosis of digits and no clubbing  NEURO: alert & oriented x 3 with fluent speech, no focal motor/sensory deficits  LABORATORY DATA:  I have reviewed the data as listed CBC Latest Ref Rng & Units 04/24/2018 03/25/2018 03/18/2018  WBC 3.9 - 10.3 K/uL 12.1(H) - 11.4(H)  Hemoglobin 11.6 - 15.9 g/dL 13.4 12.2 14.3  Hematocrit 34.8 - 46.6 % 40.8 - 42.1  Platelets 145 - 400 K/uL 320 - 277     CMP Latest Ref Rng & Units 02/24/2018 01/16/2018 10/22/2017  Glucose 70 - 140 mg/dL 112 91 96  BUN 7 - 26 mg/dL 15 11 11   Creatinine 0.60 - 1.10 mg/dL 0.76 0.72 0.74  Sodium 136 - 145 mmol/L 140 137 139  Potassium 3.5 - 5.1 mmol/L 3.6 3.9 3.8  Chloride 98 - 109 mmol/L 106 108 108  CO2 22 - 29 mmol/L 26 22 23   Calcium 8.4 - 10.4 mg/dL 9.3 9.1 9.2  Total Protein 6.4 - 8.3 g/dL 7.1 7.3 7.0  Total Bilirubin 0.2 - 1.2 mg/dL 0.3 0.3 0.3  Alkaline Phos 40 - 150 U/L 49 50 56  AST 5 - 34 U/L 16 18 15   ALT 0 - 55 U/L 11 19 11      RADIOGRAPHIC STUDIES: I have personally reviewed the radiological images as listed and agreed with the findings in the report. No results found.   ASSESSMENT & PLAN:  Jill Davidson is a 47 y.o. female with history of GERD.   1. IDA, due to chronic heavy blood loss from uterine fibroid  -She has long-standing history of iron deficiency anemia, secondary to menorrhagia from uterine fibroid.  She received IV iron in 2014, and again in early 2019.  She responded very well, anemia resolved and iron level improved.   -She previously tried oral iron, did not tolerate well due to the constipation.  And per patient, it was not effective.  -She had laparoscopic vaginal hysterectomy with bilateral salpingectomy on 03/24/2018 with Dr. Enzo Bi.  She is recovering well from surgery. - Her IDA will probably  resolved after surgery.  She has no GI surgery or symptoms to suggest absorbing issue. -I educated her about maintaining a healthy diet that contains iron. -Labs reviewed today. Her Hg is 13.4. Her WBCs were elevated at 12.1, which I suspect is reactive. - I will monitor with labs in 3 months and f/u and labs in 6 months  2.HTN -She says that her BP started elevating after starting IV iron -She also says that she sees a nephrologist for this - will monitor   Plan: -Lab results reviewed with patient, no anemia, iron studies still pending, I will call her with the results, and set up IVR if needed.  - Labs in 3 months - F/u in 6 months with labs a few days before   All questions were answered. The patient knows to call the clinic with any problems, questions or concerns. No barriers to learning was detected. I spent 20 minutes counseling the patient face to face. The total time spent in the appointment was 25 minutes and more than 50% was on counseling and review of test results  I, Noor Dweik am acting as scribe for Dr. Truitt Merle.  I have reviewed the above documentation for accuracy and completeness, and I agree with the above.      Truitt Merle, MD 04/24/2018

## 2018-04-23 ENCOUNTER — Other Ambulatory Visit: Payer: Self-pay | Admitting: Hematology

## 2018-04-23 DIAGNOSIS — D508 Other iron deficiency anemias: Secondary | ICD-10-CM

## 2018-04-24 ENCOUNTER — Inpatient Hospital Stay: Payer: BLUE CROSS/BLUE SHIELD

## 2018-04-24 ENCOUNTER — Encounter: Payer: Self-pay | Admitting: Hematology

## 2018-04-24 ENCOUNTER — Telehealth: Payer: Self-pay | Admitting: Hematology

## 2018-04-24 ENCOUNTER — Inpatient Hospital Stay: Payer: BLUE CROSS/BLUE SHIELD | Attending: Hematology and Oncology | Admitting: Hematology

## 2018-04-24 VITALS — BP 143/96 | HR 67 | Temp 98.0°F | Resp 18 | Ht 63.0 in | Wt 206.5 lb

## 2018-04-24 DIAGNOSIS — Z720 Tobacco use: Secondary | ICD-10-CM

## 2018-04-24 DIAGNOSIS — D500 Iron deficiency anemia secondary to blood loss (chronic): Secondary | ICD-10-CM | POA: Diagnosis not present

## 2018-04-24 DIAGNOSIS — D508 Other iron deficiency anemias: Secondary | ICD-10-CM

## 2018-04-24 DIAGNOSIS — Z8742 Personal history of other diseases of the female genital tract: Secondary | ICD-10-CM

## 2018-04-24 DIAGNOSIS — N920 Excessive and frequent menstruation with regular cycle: Secondary | ICD-10-CM | POA: Diagnosis not present

## 2018-04-24 DIAGNOSIS — D509 Iron deficiency anemia, unspecified: Secondary | ICD-10-CM | POA: Insufficient documentation

## 2018-04-24 DIAGNOSIS — I10 Essential (primary) hypertension: Secondary | ICD-10-CM | POA: Insufficient documentation

## 2018-04-24 LAB — CBC WITH DIFFERENTIAL (CANCER CENTER ONLY)
Basophils Absolute: 0 10*3/uL (ref 0.0–0.1)
Basophils Relative: 0 %
Eosinophils Absolute: 0.2 10*3/uL (ref 0.0–0.5)
Eosinophils Relative: 1 %
HEMATOCRIT: 40.8 % (ref 34.8–46.6)
HEMOGLOBIN: 13.4 g/dL (ref 11.6–15.9)
LYMPHS PCT: 36 %
Lymphs Abs: 4.4 10*3/uL — ABNORMAL HIGH (ref 0.9–3.3)
MCH: 30.6 pg (ref 25.1–34.0)
MCHC: 32.8 g/dL (ref 31.5–36.0)
MCV: 93.2 fL (ref 79.5–101.0)
MONO ABS: 0.6 10*3/uL (ref 0.1–0.9)
MONOS PCT: 5 %
NEUTROS ABS: 6.9 10*3/uL — AB (ref 1.5–6.5)
NEUTROS PCT: 58 %
Platelet Count: 320 10*3/uL (ref 145–400)
RBC: 4.38 MIL/uL (ref 3.70–5.45)
RDW: 12.8 % (ref 11.2–14.5)
WBC Count: 12.1 10*3/uL — ABNORMAL HIGH (ref 3.9–10.3)

## 2018-04-24 LAB — IRON AND TIBC
IRON: 45 ug/dL (ref 41–142)
Saturation Ratios: 13 % — ABNORMAL LOW (ref 21–57)
TIBC: 349 ug/dL (ref 236–444)
UIBC: 304 ug/dL

## 2018-04-24 LAB — FERRITIN: Ferritin: 26 ng/mL (ref 11–307)

## 2018-04-24 NOTE — Telephone Encounter (Signed)
Scheduled appt per 8/8 los - gave patient AVS and calender per los.  

## 2018-04-25 ENCOUNTER — Telehealth: Payer: Self-pay

## 2018-04-25 NOTE — Telephone Encounter (Signed)
Spoke with patient per Dr. Burr Medico notified her ferritin level was within normal limits a little lower than previously, no need for IV iron at presents.  Instructed to take oral iron daily.

## 2018-04-25 NOTE — Telephone Encounter (Signed)
-----   Message from Truitt Merle, MD sent at 04/25/2018  8:48 AM EDT ----- Please let pt know her iron study results. Her ferritin is WNL, but lower than before. OK to continue oral iron, no need iv iron for now. Thanks   Truitt Merle  04/25/2018

## 2018-05-06 ENCOUNTER — Encounter: Payer: Self-pay | Admitting: Obstetrics and Gynecology

## 2018-05-06 ENCOUNTER — Ambulatory Visit (INDEPENDENT_AMBULATORY_CARE_PROVIDER_SITE_OTHER): Payer: BLUE CROSS/BLUE SHIELD | Admitting: Obstetrics and Gynecology

## 2018-05-06 VITALS — BP 167/104 | HR 82 | Ht 63.0 in | Wt 203.1 lb

## 2018-05-06 DIAGNOSIS — Z90710 Acquired absence of both cervix and uterus: Secondary | ICD-10-CM

## 2018-05-06 DIAGNOSIS — N951 Menopausal and female climacteric states: Secondary | ICD-10-CM | POA: Insufficient documentation

## 2018-05-06 DIAGNOSIS — Z09 Encounter for follow-up examination after completed treatment for conditions other than malignant neoplasm: Secondary | ICD-10-CM

## 2018-05-06 NOTE — Progress Notes (Signed)
Chief complaint: 1.  Final postop check 2.  Status post LAVH bilateral salpingectomy and right oophorectomy 3.  History of abnormal uterine bleeding and uterine fibroids, status post NovaSure endometrial ablation 4.  Hot flashes  PATHOLOGY: Surgical Pathology  CASE: [id number]  PATIENT: Jill Davidson  Surgical Pathology Report  SPECIMEN SUBMITTED:  A. Uterus cervix bilateral fallopian tubes and ovary,right   DIAGNOSIS:  A. UTERUS WITH CERVIX, BILATERAL FALLOPIAN TUBES AND RIGHT OVARY; TOTAL  HYSTERECTOMY WITH BILATERAL SALPINGECTOMY AND RIGHT OOPHORECTOMY:  - CHRONIC CERVICITIS WITH SQUAMOUS METAPLASIA.  - WEAKLY PROLIFERATIVE ENDOMETRIUM, STATUS POST ABLATION.  - MYOMETRIUM WITH INTRAMURAL LEIOMYOMATA, LARGEST MEASURING 2.1 CM.  - OVARY WITH CYSTIC FOLLICLE AND PROMINENT THERMAL ARTIFACT.  - TWO UNREMARKABLE FALLOPIAN TUBES; UNREMARKABLE FIMBRIA.  - NEGATIVE FOR ATYPIA AND MALIGNANCY   Patient presents for her final postop check 6 weeks after surgery.  Major concerns include hot flashes, fatigue, and some constipation; she has started on iron supplementation recently by hematology because of slightly low hemoglobin (does not need infusion therapy as previously obtained).  She is concerned about possible hemorrhoid. Bladder function is normal. Is not experiencing any abdominal pelvic pain. She is not experiencing any vaginal discharge or bleeding.  OBJECTIVE: BP (!) 167/104   Pulse 82   Ht 5\' 3"  (1.6 m)   Wt 203 lb 1.6 oz (92.1 kg)   LMP 03/21/2018 (Exact Date)   BMI 35.98 kg/m  Pleasant well-appearing female no acute distress.  Alert and oriented. Abdomen: Soft, nontender; laparoscopy port sites are well-healed; no hernias Pelvic exam: External genitalia-normal BUS-normal Vagina-good estrogen effect; good vaginal vault support; vaginal cuff is intact; bimanual reveals no masses or tenderness Cervix-surgically absent Uterus-surgically absent Rectovaginal-small external  hemorrhoid, nonthrombosed; normal sphincter tone; no rectal masses  ASSESSMENT: 1.  Normal 6-week postop check status post LAVH bilateral salpingectomy with right oophorectomy 2.  Pathology notable for uterine fibroids 3.  Vasomotor symptoms 4.  Mild constipation associated with iron use  PLAN: 1.  Resume all activities without restriction 2.  Monitor vasomotor symptoms 3.  Return in 4 weeks for follow-up 4.  Recommend Colace 100 mg twice a day for stool softener and increase water intake to 6 to 8 glasses/day  Brayton Mars, MD  Note: This dictation was prepared with Dragon dictation along with smaller phrase technology. Any transcriptional errors that result from this process are unintentional.

## 2018-05-06 NOTE — Patient Instructions (Addendum)
1.  Resume all activities without restriction. 2.  Continue to monitor for any hot flashes and night sweats. 3.  Return in 4 weeks for follow-up on vasomotor symptoms. 4.  Recommend Colace 100 mg twice a day stool softener.

## 2018-05-08 DIAGNOSIS — I10 Essential (primary) hypertension: Secondary | ICD-10-CM | POA: Diagnosis not present

## 2018-06-04 ENCOUNTER — Ambulatory Visit (INDEPENDENT_AMBULATORY_CARE_PROVIDER_SITE_OTHER): Payer: BLUE CROSS/BLUE SHIELD | Admitting: Obstetrics and Gynecology

## 2018-06-04 ENCOUNTER — Other Ambulatory Visit (HOSPITAL_COMMUNITY)
Admission: RE | Admit: 2018-06-04 | Discharge: 2018-06-04 | Disposition: A | Discharge status: Home / Self Care | Payer: BLUE CROSS/BLUE SHIELD | Source: Ambulatory Visit | Attending: Obstetrics and Gynecology | Admitting: Obstetrics and Gynecology

## 2018-06-04 ENCOUNTER — Encounter: Payer: Self-pay | Admitting: Obstetrics and Gynecology

## 2018-06-04 VITALS — BP 172/102 | HR 67 | Ht 63.0 in | Wt 199.7 lb

## 2018-06-04 DIAGNOSIS — A5901 Trichomonal vulvovaginitis: Secondary | ICD-10-CM | POA: Insufficient documentation

## 2018-06-04 DIAGNOSIS — I10 Essential (primary) hypertension: Secondary | ICD-10-CM | POA: Diagnosis not present

## 2018-06-04 DIAGNOSIS — R51 Headache: Secondary | ICD-10-CM

## 2018-06-04 DIAGNOSIS — Z202 Contact with and (suspected) exposure to infections with a predominantly sexual mode of transmission: Secondary | ICD-10-CM | POA: Diagnosis not present

## 2018-06-04 DIAGNOSIS — N951 Menopausal and female climacteric states: Secondary | ICD-10-CM

## 2018-06-04 DIAGNOSIS — N898 Other specified noninflammatory disorders of vagina: Secondary | ICD-10-CM | POA: Diagnosis not present

## 2018-06-04 DIAGNOSIS — R519 Headache, unspecified: Secondary | ICD-10-CM

## 2018-06-04 DIAGNOSIS — Z90710 Acquired absence of both cervix and uterus: Secondary | ICD-10-CM

## 2018-06-04 MED ORDER — METRONIDAZOLE 500 MG PO TABS: 500.0000 mg | ORAL_TABLET | Freq: Two times a day (BID) | ORAL | 0 refills | Status: DC

## 2018-06-04 MED ORDER — METRONIDAZOLE 500 MG PO TABS: 500.0000 mg | ORAL_TABLET | Freq: Two times a day (BID) | ORAL | 1 refills | Status: DC

## 2018-06-04 NOTE — Patient Instructions (Signed)
1.  Wet prep is performed today.  Trichomonas vaginalis infection is identified. 2.  Metronidazole 500 mg twice a day as prescribed for treatment.  Partner needs to be treated as well.  Avoid alcohol during therapy. 3.  STD screen is performed today. 4.  FSH test and TSH tests are ordered to assess vasomotor symptoms 5.  Return in 2 weeks for follow-up and further management planning 6.  Follow-up with internal medicine for management of headaches and hypertension  Metronidazole extended-release tablets What is this medicine? METRONIDAZOLE (me troe NI da zole) is an antiinfective. It is used to treat certain kinds of bacterial and protozoal infections. It will not work for colds, flu, or other viral infections. This medicine may be used for other purposes; ask your health care provider or pharmacist if you have questions. COMMON BRAND NAME(S): Flagyl ER What should I tell my health care provider before I take this medicine? They need to know if you have any of these conditions: -anemia or other blood disorders -disease of the nervous system -fungal or yeast infection -if you drink alcohol containing drinks -liver disease -seizures -an unusual or allergic reaction to metronidazole, or other medicines, foods, dyes, or preservatives -pregnant or trying to get pregnant -breast-feeding How should I use this medicine? Take this medicine by mouth with a full glass of water. Follow the directions on the prescription label. Do not crush or chew. Take this medicine on an empty stomach 1 hour before or 2 hours after meals or food. Take your medicine at regular intervals. Do not take your medicine more often than directed. Take all of your medicine as directed even if you think you are better. Do not skip doses or stop your medicine early. Talk to your pediatrician regarding the use of this medicine in children. Special care may be needed. Overdosage: If you think you have taken too much of this  medicine contact a poison control center or emergency room at once. NOTE: This medicine is only for you. Do not share this medicine with others. What if I miss a dose? If you miss a dose, take it as soon as you can. If it is almost time for your next dose, take only that dose. Do not take double or extra doses. What may interact with this medicine? Do not take this medicine with any of the following medications: -alcohol or any product that contains alcohol -amprenavir oral solution -cisapride -disulfiram -dofetilide -dronedarone -paclitaxel injection -pimozide -ritonavir oral solution -sertraline oral solution -sulfamethoxazole-trimethoprim injection -thioridazine -ziprasidone This medicine may also interact with the following medications: -birth control pills -cimetidine -lithium -other medicines that prolong the QT interval (cause an abnormal heart rhythm) -phenobarbital -phenytoin -warfarin This list may not describe all possible interactions. Give your health care provider a list of all the medicines, herbs, non-prescription drugs, or dietary supplements you use. Also tell them if you smoke, drink alcohol, or use illegal drugs. Some items may interact with your medicine. What should I watch for while using this medicine? Tell your doctor or health care professional if your symptoms do not improve or if they get worse. You may get drowsy or dizzy. Do not drive, use machinery, or do anything that needs mental alertness until you know how this medicine affects you. Do not stand or sit up quickly, especially if you are an older patient. This reduces the risk of dizzy or fainting spells. Avoid alcoholic drinks while you are taking this medicine and for three days afterward. Alcohol  may make you feel dizzy, sick, or flushed. If you are being treated for a sexually transmitted disease, avoid sexual contact until you have finished your treatment. Your sexual partner may also need  treatment. What side effects may I notice from receiving this medicine? Side effects that you should report to your doctor or health care professional as soon as possible: -allergic reactions like skin rash, itching or hives, swelling of the face, lips, or tongue -confusion, clumsiness -difficulty speaking -discolored or sore mouth -dizziness -fever, infection -numbness, tingling, pain or weakness in the hands or feet -trouble passing urine or change in the amount of urine -redness, blistering, peeling or loosening of the skin, including inside the mouth -seizures -unusually weak or tired -vaginal irritation, dryness, or discharge Side effects that usually do not require medical attention (report to your doctor or health care professional if they continue or are bothersome): -diarrhea -headache -irritability -metallic taste -nausea -stomach pain or cramps -trouble sleeping This list may not describe all possible side effects. Call your doctor for medical advice about side effects. You may report side effects to FDA at 1-800-FDA-1088. Where should I keep my medicine? Keep out of the reach of children. Store at room temperature between 15 and 30 degrees C (59 and 86 degrees F). Protect from light and moisture. Keep container tightly closed. Throw away any unused medicine after the expiration date. NOTE: This sheet is a summary. It may not cover all possible information. If you have questions about this medicine, talk to your doctor, pharmacist, or health care provider.  2018 Elsevier/Gold Standard (2013-04-10 14:05:10)  Trichomoniasis Trichomoniasis is an STI (sexually transmitted infection) that can affect both women and men. In women, the outer area of the female genitalia (vulva) and the vagina are affected. In men, the penis is mainly affected, but the prostate and other reproductive organs can also be involved. This condition can be treated with medicine. It often has no symptoms  (is asymptomatic), especially in men. What are the causes? This condition is caused by an organism called Trichomonas vaginalis. Trichomoniasis most often spreads from person to person (is contagious) through sexual contact. What increases the risk? The following factors may make you more likely to develop this condition:  Having unprotected sexual intercourse.  Having sexual intercourse with a partner who has trichomoniasis.  Having multiple sexual partners.  Having had previous trichomoniasis infections or other STIs.  What are the signs or symptoms? In women, symptoms of trichomoniasis include:  Abnormal vaginal discharge that is clear, white, gray, or yellow-green and foamy and has an unusual "fishy" odor.  Itching and irritation of the vagina and vulva.  Burning or pain during urination or sexual intercourse.  Genital redness and swelling.  In men, symptoms of trichomoniasis include:  Penile discharge that may be foamy or contain pus.  Pain in the penis. This may happen only when urinating.  Itching or irritation inside the penis.  Burning after urination or ejaculation.  How is this diagnosed? In women, this condition may be found during a routine Pap test or physical exam. It may be found in men during a routine physical exam. Your health care provider may perform tests to help diagnose this infection, such as:  Urine tests (men and women).  The following in women: ? Testing the pH of the vagina. ? A vaginal swab test that checks for the Trichomonas vaginalis organism. ? Testing vaginal secretions.  Your health care provider may test you for other STIs, including HIV (  human immunodeficiency virus). How is this treated? This condition is treated with medicine taken by mouth (orally), such as metronidazole or tinidazole to fight the infection. Your sexual partner(s) may also need to be tested and treated.  If you are a woman and you plan to become pregnant or  think you may be pregnant, tell your health care provider right away. Some medicines that are used to treat the infection should not be taken during pregnancy.  Your health care provider may recommend over-the-counter medicines or creams to help relieve itching or irritation. You may be tested for infection again 3 months after treatment. Follow these instructions at home:  Take and use over-the-counter and prescription medicines, including creams, only as told by your health care provider.  Do not have sexual intercourse until one week after you finish your medicine, or until your health care provider approves. Ask your health care provider when you may resume sexual intercourse.  (Women) Do not douche or wear tampons while you have the infection.  Discuss your infection with your sexual partner(s). Make sure that your partner gets tested and treated, if necessary.  Keep all follow-up visits as told by your health care provider. This is important. How is this prevented?  Use condoms every time you have sex. Using condoms correctly and consistently can help protect against STIs.  Avoid having multiple sexual partners.  Talk with your sexual partner about any symptoms that either of you may have, as well as any history of STIs.  Get tested for STIs and STDs (sexually transmitted diseases) before you have sex. Ask your partner to do the same.  Do not have sexual contact if you have symptoms of trichomoniasis or another STI. Contact a health care provider if:  You still have symptoms after you finish your medicine.  You develop pain in your abdomen.  You have pain when you urinate.  You have bleeding after sexual intercourse.  You develop a rash.  You feel nauseous or you vomit.  You plan to become pregnant or think you may be pregnant. Summary  Trichomoniasis is an STI (sexually transmitted infection) that can affect both women and men.  This condition often has no symptoms  (is asymptomatic), especially in men.  You should not have sexual intercourse until one week after you finish your medicine, or until your health care provider approves. Ask your health care provider when you may resume sexual intercourse.  Discuss your infection with your sexual partner. Make sure that your partner gets tested and treated, if necessary. This information is not intended to replace advice given to you by your health care provider. Make sure you discuss any questions you have with your health care provider. Document Released: 02/27/2001 Document Revised: 07/27/2016 Document Reviewed: 07/27/2016 Elsevier Interactive Patient Education  2017 [employer].

## 2018-06-04 NOTE — Progress Notes (Signed)
Chief complaint: 1.  Vasomotor symptoms 2.  Chronic constipation 3.  Headaches  Jill Davidson presents today for one-month follow-up after final postop check for LAVH bilateral salpingectomy with right oophorectomy for symptomatic uterine fibroids. She was recently treated with Colace and increase water intake for chronic constipation; this appears to have improved over the past month. She also has been complaining of some vasomotor symptoms which are occurring on a daily basis, at least several times a day.  The severity of the symptoms have lessened but they still persist. Jill Davidson is also complaining of tan vaginal discharge and odor.  This has been ongoing for several weeks.  She does not report any significant bright red bleeding per vagina.  She does not report any UTI symptoms at this time.  Other significant health concern is headaches.  She has been having daily headaches and has been waking up with them in the morning.  She does have a history of hypertension at which is managed by internal medicine and she has recently had her medication increased to losartan 100 mg daily. Sydnie does not report any chest pain, shortness of breath, dyspnea on exertion, paroxysmal nocturnal dyspnea, exercise intolerance, visual changes, or fatigue.  Past medical history, past surgical history, problem list, medications, and allergies are reviewed.  OBJECTIVE: BP (!) 172/102   Pulse 67   Ht 5\' 3"  (1.6 m)   Wt 199 lb 11.2 oz (90.6 kg)   LMP 03/21/2018 (Exact Date)   BMI 35.38 kg/m  Pleasant well-appearing female in no acute distress.  Alert and oriented.  Affect is appropriate. HEENT exam: Normocephalic atraumatic; no facial asymmetry; Neck: Supple; without adenopathy or thyromegaly Lungs: Clear Heart: Regular rate and rhythm without murmur Abdomen: Soft, nontender, no organomegaly; laparoscopy port sites are well-healed. Pelvic: External genitalia-normal BUS-normal Vagina-abundant yellow discharge  with slight malodor; no lesions; vaginal cuff intact Bimanual-no significant mass or tenderness Rectovaginal-normal external exam  PROCEDURE: Wet prep Normal saline-TNTC white blood cells; motile trichomonas present; no clue cells KOH-no hyphae  ASSESSMENT: 1.  Vasomotor symptoms 2.  Status post LAVH bilateral salpingectomy RSO for symptomatic fibroids 3.  Vaginal discharge 4.  STD exposure 5.  Trichomonas vaginalis 6.  Elevated blood pressure with headaches, on medication  PLAN: 1.  FSH and TSH; STD screening 2.  Wet prep as noted 3.  Metronidazole 500 mg twice a day for 7 days: Refill x1 month disease partner also to be treated) 4.  Return in 2 weeks for follow-up and further management planning 5.  Follow-up with internal medicine for further management of headaches and elevated blood pressure  A total of 25 minutes were spent face-to-face with the patient during this encounter and over half of that time involved counseling and coordination of care.  Brayton Mars, MD  Note: This dictation was prepared with Dragon dictation along with smaller phrase technology. Any transcriptional errors that result from this process are unintentional.

## 2018-06-05 ENCOUNTER — Encounter: Payer: Self-pay | Admitting: Obstetrics and Gynecology

## 2018-06-05 DIAGNOSIS — R768 Other specified abnormal immunological findings in serum: Secondary | ICD-10-CM | POA: Insufficient documentation

## 2018-06-05 DIAGNOSIS — B009 Herpesviral infection, unspecified: Secondary | ICD-10-CM | POA: Insufficient documentation

## 2018-06-05 DIAGNOSIS — Z1159 Encounter for screening for other viral diseases: Secondary | ICD-10-CM

## 2018-06-05 LAB — RPR: RPR: NONREACTIVE

## 2018-06-05 LAB — CERVICOVAGINAL ANCILLARY ONLY
Bacterial vaginitis: POSITIVE — AB
Candida vaginitis: NEGATIVE
Chlamydia: NEGATIVE
Neisseria Gonorrhea: NEGATIVE
Trichomonas: POSITIVE — AB

## 2018-06-05 LAB — HSV(HERPES SIMPLEX VRS) I + II AB-IGG
HSV 1 GLYCOPROTEIN G AB, IGG: 48.5 {index} — AB (ref 0.00–0.90)
HSV 2 IGG, TYPE SPEC: 7.63 {index} — AB (ref 0.00–0.90)

## 2018-06-05 LAB — HEPATITIS B SURFACE ANTIGEN: Hepatitis B Surface Ag: NEGATIVE

## 2018-06-05 LAB — HIV ANTIBODY (ROUTINE TESTING W REFLEX): HIV SCREEN 4TH GENERATION: NONREACTIVE

## 2018-06-05 LAB — TSH: TSH: 0.822 u[IU]/mL (ref 0.450–4.500)

## 2018-06-05 LAB — HEPATITIS C ANTIBODY

## 2018-06-18 ENCOUNTER — Encounter: Payer: BLUE CROSS/BLUE SHIELD | Admitting: Obstetrics and Gynecology

## 2018-06-24 ENCOUNTER — Encounter: Payer: BLUE CROSS/BLUE SHIELD | Admitting: Obstetrics and Gynecology

## 2018-07-08 ENCOUNTER — Ambulatory Visit: Payer: BLUE CROSS/BLUE SHIELD | Attending: Family Medicine

## 2018-07-15 ENCOUNTER — Telehealth: Payer: Self-pay | Admitting: Obstetrics and Gynecology

## 2018-07-15 NOTE — Telephone Encounter (Signed)
LMTRC

## 2018-07-15 NOTE — Telephone Encounter (Signed)
The patient is suffering from hot flashes and is requesting some medication to be called in for them.  She is using same pharmacy.  She also just found out she has herpes and has a flare up on her bottom [1 lesion] and very painful, and came up 3 to 4 days ago.  She is also asking for something for this.  Please advise, thanks.

## 2018-07-17 NOTE — Telephone Encounter (Signed)
Pt states she is having hot flashes daily. Possible lesion on her bottom. Not painful today. Appt made for 07/23/2018.

## 2018-07-24 ENCOUNTER — Encounter: Payer: Self-pay | Admitting: Obstetrics and Gynecology

## 2018-07-24 ENCOUNTER — Ambulatory Visit (INDEPENDENT_AMBULATORY_CARE_PROVIDER_SITE_OTHER): Payer: BLUE CROSS/BLUE SHIELD | Admitting: Obstetrics and Gynecology

## 2018-07-24 VITALS — BP 160/95 | HR 64 | Ht 63.0 in | Wt 205.3 lb

## 2018-07-24 DIAGNOSIS — I10 Essential (primary) hypertension: Secondary | ICD-10-CM | POA: Diagnosis not present

## 2018-07-24 DIAGNOSIS — R232 Flushing: Secondary | ICD-10-CM

## 2018-07-24 DIAGNOSIS — Z720 Tobacco use: Secondary | ICD-10-CM | POA: Insufficient documentation

## 2018-07-24 DIAGNOSIS — N951 Menopausal and female climacteric states: Secondary | ICD-10-CM | POA: Diagnosis not present

## 2018-07-24 NOTE — Progress Notes (Signed)
Chief complaint: 1.  Vasomotor symptoms 2.  Hypertension 3.  Conference-15 minutes  Jill Davidson presents today for follow-up.  Lab work was performed to assess vasomotor symptoms as well as STD screening.  Findings were reviewed.  TSH was normal.  The [facility] which was previously desired, was not obtained. Patient continues to have the hot flashes and night sweats and these appear to be getting progressively worse.  Jill Davidson would be interested in estrogen replacement therapy.  She is status post LAVH bilateral salpingectomy and right oophorectomy.  Concern today is for her uncontrolled blood pressure; she states that she has not taken her blood pressure medicine for the past several days and needs to get it refilled. Jill Davidson continues to smoke.  Past Medical History:  Diagnosis Date  . AR (allergic rhinitis)   . Bacterial vaginosis   . Complication of anesthesia 2008   PT STATES THAT DURING C-SECTION SHE WAS ADMINISTERED A GAS THAT CAUSED HER HEART TO BEAT IRREGULAR- PT HAS HAD NO PROBLEMS WITH THIS SINCE 2008  . Dysrhythmia    DUE TO A GAS THAT WAS ADMINISTERED DURING A 2008 C-SECTION-NO PROBLEMS SINCE  . Fibroid   . GERD (gastroesophageal reflux disease)    OCC  . Hypertension   . IDA (iron deficiency anemia) 01/25/2015  . UTI (lower urinary tract infection)    Past Surgical History:  Procedure Laterality Date  . CESAREAN SECTION     X2  . DILATION AND CURETTAGE OF UTERUS    . DILITATION & CURRETTAGE/HYSTROSCOPY WITH NOVASURE ABLATION N/A 07/23/2016   Procedure: DILATATION & CURETTAGE/HYSTEROSCOPY WITH NOVASURE ABLATION;  Surgeon: Brayton Mars, MD;  Location: [facility] ORS;  Service: Gynecology;  Laterality: N/A;  . LAPAROSCOPIC VAGINAL HYSTERECTOMY WITH SALPINGECTOMY Bilateral 03/24/2018   Procedure: LAPAROSCOPIC ASSISTED VAGINAL HYSTERECTOMY WITH BILATERAL SALPINGECTOMY;  Surgeon: Brayton Mars, MD;  Location: [facility] ORS;  Service: Gynecology;  Laterality: Bilateral;   Review of  Systems  Constitutional:       Hot flashes and night sweats worsening  Eyes: Negative for blurred vision.  Respiratory: Negative for shortness of breath.   Cardiovascular: Negative for chest pain.  Neurological: Negative for dizziness and headaches.   OBJECTIVE: BP (!) 160/95   Pulse 64   Ht 5\' 3"  (1.6 m)   Wt 205 lb 4.8 oz (93.1 kg)   LMP 03/21/2018 (Exact Date)   BMI 36.37 kg/m  Physical exam is deferred today  ASSESSMENT: 1.Status post LAVH bilateral salpingectomy right oophorectomy 2.  Worsening vasomotor symptoms 3.  Normal TSH 4.  Uncontrolled blood pressure, secondary to inconsistent compliance with medication and possibly suboptimal medication regimen 5.  Tobacco user  PLAN: 1.  FSH is ordered today. 2.  Patient is to get blood pressure medication filled and restarted 3.  Follow-up with Dr. Ancil Boozer for further evaluation of hypertension management 4.  Return in 2 weeks for reassessment of vasomotor symptoms, FSH testing, and possible initiation of estrogen replacement therapy.  Patient does understand the potential risks with uncontrolled blood pressure, tobacco use, and initiation of estrogen.  A total of 15 minutes were spent face-to-face with the patient during this encounter and over half of that time dealt with counseling and coordination of care.  Brayton Mars, MD  Note: This dictation was prepared with Dragon dictation along with smaller phrase technology. Any transcriptional errors that result from this process are unintentional.

## 2018-07-24 NOTE — Patient Instructions (Addendum)
1.  [place] test is drawn today. 2.  Follow-up with Dr. Ancil Boozer regarding hypertension management 3.  Return on 08/11/2018 for consideration of ERT supplementation due to vasomotor symptoms. 4.  Smoking cessation strongly encouraged

## 2018-07-25 ENCOUNTER — Inpatient Hospital Stay: Payer: BLUE CROSS/BLUE SHIELD | Attending: Hematology and Oncology

## 2018-07-25 LAB — FOLLICLE STIMULATING HORMONE: FSH: 77.6 m[IU]/mL

## 2018-08-19 ENCOUNTER — Encounter: Payer: BLUE CROSS/BLUE SHIELD | Admitting: Obstetrics and Gynecology

## 2018-10-22 ENCOUNTER — Inpatient Hospital Stay: Payer: BLUE CROSS/BLUE SHIELD | Attending: Hematology and Oncology

## 2018-10-24 ENCOUNTER — Inpatient Hospital Stay: Payer: BLUE CROSS/BLUE SHIELD | Admitting: Hematology

## 2018-11-26 ENCOUNTER — Telehealth: Payer: Self-pay | Admitting: Hematology

## 2018-11-26 NOTE — Telephone Encounter (Signed)
Returned call to patient re rescheduling appointment. Spoke with patient re lab/fu 3/19.

## 2018-12-03 NOTE — Progress Notes (Signed)
[facility]   [phone] [phone]   Clinic Follow up Note   Patient Care Team: Steele Sizer, MD as PCP - General (Family Medicine) 12/04/2018  CHIEF COMPLAINT: F/u on IDA   PREVIOUS THERAPY: -IV ferric gluconate 125mg  weekly in 2014, and 10/24/2017-12/24/2017  CURRENT THERAPY: iv iron as needed   INTERVAL HISTORY: Jill Davidson is a 48 y.o. female who is here for follow-up. Today, she is here with a family member. Her BP was elevated today at 211/107. She has been taking a lot of ibuprofen lately and eating a lot of bacon and sausage. She denies any cp, head aches or any other major problems. She takes blood pressure medication but  does not monitor her BP at home. Lately she has been feeling fatigue and she is concerned it is associated with her [place].    Pertinent positives and negatives of review of systems are listed and detailed within the above HPI.  REVIEW OF SYSTEMS:   Constitutional: Denies fevers, chills or abnormal weight loss, (+) fatigue  Eyes: Denies blurriness of vision Ears, nose, mouth, throat, and face: Denies mucositis or sore throat Respiratory: Denies cough, dyspnea or wheezes Cardiovascular: Denies palpitation, chest discomfort or lower extremity swelling Gastrointestinal:  Denies nausea, heartburn or change in bowel habits Skin: Denies abnormal skin rashes Lymphatics: Denies new lymphadenopathy or easy bruising Neurological:Denies numbness, tingling or new weaknesses Behavioral/Psych: Mood is stable, no new changes  All other systems were reviewed with the patient and are negative.  MEDICAL HISTORY:  Past Medical History:  Diagnosis Date  . AR (allergic rhinitis)   . Bacterial vaginosis   . Complication of anesthesia 2008   PT STATES THAT DURING C-SECTION SHE WAS ADMINISTERED A GAS THAT CAUSED HER HEART TO BEAT IRREGULAR- PT HAS HAD NO PROBLEMS WITH THIS SINCE 2008  . Dysrhythmia    DUE TO A GAS THAT WAS  ADMINISTERED DURING A 2008 C-SECTION-NO PROBLEMS SINCE  . Fibroid   . GERD (gastroesophageal reflux disease)    OCC  . Hypertension   . IDA (iron deficiency anemia) 01/25/2015  . UTI (lower urinary tract infection)     SURGICAL HISTORY: Past Surgical History:  Procedure Laterality Date  . CESAREAN SECTION     X2  . DILATION AND CURETTAGE OF UTERUS    . DILITATION & CURRETTAGE/HYSTROSCOPY WITH NOVASURE ABLATION N/A 07/23/2016   Procedure: DILATATION & CURETTAGE/HYSTEROSCOPY WITH NOVASURE ABLATION;  Surgeon: Brayton Mars, MD;  Location: [facility] ORS;  Service: Gynecology;  Laterality: N/A;  . LAPAROSCOPIC VAGINAL HYSTERECTOMY WITH SALPINGECTOMY Bilateral 03/24/2018   Procedure: LAPAROSCOPIC ASSISTED VAGINAL HYSTERECTOMY WITH BILATERAL SALPINGECTOMY;  Surgeon: Brayton Mars, MD;  Location: [facility] ORS;  Service: Gynecology;  Laterality: Bilateral;    I have reviewed the social history and family history with the patient and they are unchanged from previous note.  ALLERGIES:  has No Known Allergies.  MEDICATIONS:  Current Outpatient Medications  Medication Sig Dispense Refill  . fluticasone (FLONASE) 50 MCG/ACT nasal spray Place 2 sprays into both nostrils daily. (Patient taking differently: Place 2 sprays into both nostrils daily as needed for allergies or rhinitis. ) 16 g 6  . losartan (COZAAR) 25 MG tablet Take 100 mg by mouth daily. Take with 50 mg dose to equal 75  3  . hydrochlorothiazide (MICROZIDE) 12.5 MG capsule Take 1 capsule (12.5 mg total) by mouth daily. 30 capsule 0   No current facility-administered medications for this visit.     PHYSICAL  EXAMINATION: ECOG PERFORMANCE STATUS: 1 - Symptomatic but completely ambulatory  Vitals:   12/04/18 1447 12/04/18 1450  BP: (!) 211/107 (!) 206/100  Pulse: 69   Resp: 18   Temp: 97.7 F (36.5 C)   SpO2: 100%    Filed Weights   12/04/18 1447  Weight: 212 lb (96.2 kg)    GENERAL:alert, no distress and comfortable  SKIN: skin color, texture, turgor are normal, no rashes or significant lesions EYES: normal, Conjunctiva are pink and non-injected, sclera clear OROPHARYNX:no exudate, no erythema and lips, buccal mucosa, and tongue normal  NECK: supple, thyroid normal size, non-tender, without nodularity LYMPH:  no palpable lymphadenopathy in the cervical, axillary or inguinal LUNGS: clear to auscultation and percussion with normal breathing effort HEART: regular rate & rhythm and no murmurs and no lower extremity edema ABDOMEN:abdomen soft, non-tender and normal bowel sounds Musculoskeletal:no cyanosis of digits and no clubbing  NEURO: alert & oriented x 3 with fluent speech, no focal motor/sensory deficits  LABORATORY DATA:  I have reviewed the data as listed CBC Latest Ref Rng & Units 12/04/2018 04/24/2018 03/25/2018  WBC 4.0 - 10.5 K/uL 11.1(H) 12.1(H) -  Hemoglobin 12.0 - 15.0 g/dL 14.4 13.4 12.2  Hematocrit 36.0 - 46.0 % 42.8 40.8 -  Platelets 150 - 400 K/uL 280 320 -     CMP Latest Ref Rng & Units 02/24/2018 01/16/2018 10/22/2017  Glucose 70 - 140 mg/dL 112 91 96  BUN 7 - 26 mg/dL 15 11 11   Creatinine 0.60 - 1.10 mg/dL 0.76 0.72 0.74  Sodium 136 - 145 mmol/L 140 137 139  Potassium 3.5 - 5.1 mmol/L 3.6 3.9 3.8  Chloride 98 - 109 mmol/L 106 108 108  CO2 22 - 29 mmol/L 26 22 23   Calcium 8.4 - 10.4 mg/dL 9.3 9.1 9.2  Total Protein 6.4 - 8.3 g/dL 7.1 7.3 7.0  Total Bilirubin 0.2 - 1.2 mg/dL 0.3 0.3 0.3  Alkaline Phos 40 - 150 U/L 49 50 56  AST 5 - 34 U/L 16 18 15   ALT 0 - 55 U/L 11 19 11       RADIOGRAPHIC STUDIES: I have personally reviewed the radiological images as listed and agreed with the findings in the report. No results found.   ASSESSMENT & PLAN:  Jill Davidson is a 48 y.o. female with history of  1. IDA, due to chronic heavy blood loss from uterine fibroid  -She has long-standing history of iron deficiency anemia, secondary to menorrhagia from uterine fibroid.  She received IV iron  in 2014, and again in early 2019.  She responded very well, anemia resolved and iron level improved.   - She does not tolerate oral iron due to constipation  -She had laparoscopic vaginal hysterectomy with bilateral salpingectomy on 03/24/2018 with Dr. Enzo Bi. After her surgery she started experiencing hot flashes.  -Labs reviewed, CBC WNL showed Hg 14.4. WBC count was slightly elevated at 11.1. Iron study still pending  -If her iron study returns normal, I will discharge her.    2.HTN, hypertension urgency - Pt states her  BP started elevating after starting IV iron - Her BP was high today at 211/107, repeated 199/100,  but she states she has been taking ibuprofen lately and eating a lot of bacon and sausage.  She denies chest pain or headaches. - She does not monitor her BP at home and I encouraged her to get a BP cuff to monitor at home and make an appointment with her PCP  -  I gave her 1 dose of clonidine 0.2 mg in the clinic, and called in HCTZ 12.5 mg daily -she will call her PCP for a f/u appointment in 2-4 weeks -She knows to go to emergency room if she develops chest discomfort or headaches.   Plan: - I prescribed HCTZ 12.5mg  daily for her high BP  -Lab results reviewed with patient, no anemia, iron studies still pending, I will call her with the results, and decide if she needs more f/u      No problem-specific Assessment & Plan notes found for this encounter.   No orders of the defined types were placed in this encounter.  All questions were answered. The patient knows to call the clinic with any problems, questions or concerns. No barriers to learning was detected. I spent 15 minutes counseling the patient face to face. The total time spent in the appointment was 20 minutes and more than 50% was on counseling and review of test results  I, Manson Allan am acting as scribe for Dr. Truitt Merle.  I have reviewed the above documentation for accuracy and completeness, and I  agree with the above.     Truitt Merle, MD 12/04/2018

## 2018-12-04 ENCOUNTER — Encounter: Payer: Self-pay | Admitting: Hematology

## 2018-12-04 ENCOUNTER — Inpatient Hospital Stay: Payer: BLUE CROSS/BLUE SHIELD | Attending: Hematology and Oncology | Admitting: Hematology

## 2018-12-04 ENCOUNTER — Inpatient Hospital Stay: Payer: BLUE CROSS/BLUE SHIELD

## 2018-12-04 ENCOUNTER — Other Ambulatory Visit: Payer: Self-pay

## 2018-12-04 VITALS — BP 206/100 | HR 69 | Temp 97.7°F | Resp 18 | Ht 63.0 in | Wt 212.0 lb

## 2018-12-04 DIAGNOSIS — D500 Iron deficiency anemia secondary to blood loss (chronic): Secondary | ICD-10-CM

## 2018-12-04 DIAGNOSIS — N920 Excessive and frequent menstruation with regular cycle: Secondary | ICD-10-CM | POA: Insufficient documentation

## 2018-12-04 DIAGNOSIS — D508 Other iron deficiency anemias: Secondary | ICD-10-CM

## 2018-12-04 DIAGNOSIS — I160 Hypertensive urgency: Secondary | ICD-10-CM | POA: Insufficient documentation

## 2018-12-04 DIAGNOSIS — D259 Leiomyoma of uterus, unspecified: Secondary | ICD-10-CM | POA: Diagnosis not present

## 2018-12-04 DIAGNOSIS — I10 Essential (primary) hypertension: Secondary | ICD-10-CM | POA: Diagnosis not present

## 2018-12-04 LAB — CBC WITH DIFFERENTIAL (CANCER CENTER ONLY)
ABS IMMATURE GRANULOCYTES: 0.02 10*3/uL (ref 0.00–0.07)
BASOS ABS: 0.1 10*3/uL (ref 0.0–0.1)
BASOS PCT: 1 %
Eosinophils Absolute: 0.2 10*3/uL (ref 0.0–0.5)
Eosinophils Relative: 1 %
HCT: 42.8 % (ref 36.0–46.0)
Hemoglobin: 14.4 g/dL (ref 12.0–15.0)
IMMATURE GRANULOCYTES: 0 %
Lymphocytes Relative: 37 %
Lymphs Abs: 4 10*3/uL (ref 0.7–4.0)
MCH: 30.4 pg (ref 26.0–34.0)
MCHC: 33.6 g/dL (ref 30.0–36.0)
MCV: 90.5 fL (ref 80.0–100.0)
Monocytes Absolute: 0.6 10*3/uL (ref 0.1–1.0)
Monocytes Relative: 5 %
NEUTROS ABS: 6.2 10*3/uL (ref 1.7–7.7)
NRBC: 0 % (ref 0.0–0.2)
Neutrophils Relative %: 56 %
Platelet Count: 280 10*3/uL (ref 150–400)
RBC: 4.73 MIL/uL (ref 3.87–5.11)
RDW: 13.3 % (ref 11.5–15.5)
WBC: 11.1 10*3/uL — AB (ref 4.0–10.5)

## 2018-12-04 LAB — IRON AND TIBC
Iron: 134 ug/dL (ref 41–142)
Saturation Ratios: 37 % (ref 21–57)
TIBC: 360 ug/dL (ref 236–444)
UIBC: 226 ug/dL (ref 120–384)

## 2018-12-04 LAB — FERRITIN: FERRITIN: 48 ng/mL (ref 11–307)

## 2018-12-04 MED ORDER — HYDROCHLOROTHIAZIDE 12.5 MG PO CAPS: 12.5000 mg | ORAL_CAPSULE | Freq: Every day | ORAL | 0 refills | Status: DC

## 2018-12-04 MED ORDER — CLONIDINE HCL 0.1 MG PO TABS
ORAL_TABLET | ORAL | Status: AC
  Filled 2018-12-04: qty 2

## 2018-12-04 MED ORDER — CLONIDINE HCL 0.1 MG PO TABS
0.2000 mg | ORAL_TABLET | Freq: Once | ORAL | Status: AC
  Administered 2018-12-04: 0.2 mg via ORAL

## 2018-12-05 ENCOUNTER — Telehealth: Payer: Self-pay | Admitting: Hematology

## 2018-12-05 ENCOUNTER — Telehealth: Payer: Self-pay

## 2018-12-05 NOTE — Telephone Encounter (Signed)
No los per 3/19.

## 2018-12-05 NOTE — Telephone Encounter (Signed)
Left voice message for this patient regarding lab results, per Dr. Burr Medico iron level is good, WNL, no need for iv iron, we will see you as needed in the future, just call for an appointment.

## 2018-12-05 NOTE — Telephone Encounter (Signed)
-----   Message from Truitt Merle, MD sent at 12/04/2018  5:35 PM EDT ----- Please let pt know her iron level is good, WNL, no need iv iron and I will see her only as needed in the future, thanks   Truitt Merle  12/04/2018

## 2019-01-07 ENCOUNTER — Other Ambulatory Visit: Payer: Self-pay | Admitting: Hematology

## 2019-02-11 ENCOUNTER — Other Ambulatory Visit: Payer: Self-pay | Admitting: Hematology

## 2019-03-05 ENCOUNTER — Telehealth: Payer: Self-pay

## 2019-03-05 NOTE — Telephone Encounter (Signed)
Copied from [place] [phone]. Topic: General - Inquiry >> Mar 05, 2019  2:05 PM Mathis Bud wrote: Reason for CRM: patient is requesting medications due to an abscess.  Patient has an abscess on jaw.  Nurse or PCP please call back  Call back # [phone]

## 2019-03-06 NOTE — Telephone Encounter (Signed)
Patient needs appointment.

## 2019-03-06 NOTE — Telephone Encounter (Signed)
We are completely booked therefore I offered for the patient to go to urgent care. Pt will try there.

## 2019-03-09 ENCOUNTER — Ambulatory Visit (INDEPENDENT_AMBULATORY_CARE_PROVIDER_SITE_OTHER): Payer: BC Managed Care – PPO | Admitting: Nurse Practitioner

## 2019-03-09 ENCOUNTER — Other Ambulatory Visit: Payer: Self-pay

## 2019-03-09 ENCOUNTER — Encounter: Payer: Self-pay | Admitting: Nurse Practitioner

## 2019-03-09 VITALS — BP 136/88 | HR 74 | Temp 98.4°F | Resp 14 | Ht 64.0 in | Wt 220.0 lb

## 2019-03-09 DIAGNOSIS — K047 Periapical abscess without sinus: Secondary | ICD-10-CM

## 2019-03-09 DIAGNOSIS — K029 Dental caries, unspecified: Secondary | ICD-10-CM

## 2019-03-09 DIAGNOSIS — H5213 Myopia, bilateral: Secondary | ICD-10-CM

## 2019-03-09 DIAGNOSIS — M2611 Maxillary asymmetry: Secondary | ICD-10-CM | POA: Diagnosis not present

## 2019-03-09 MED ORDER — CLINDAMYCIN HCL 300 MG PO CAPS: 300.0000 mg | ORAL_CAPSULE | Freq: Three times a day (TID) | ORAL | 0 refills | Status: DC

## 2019-03-09 NOTE — Patient Instructions (Signed)
Please take your antibiotic as prescribed with food on your stomach to prevent nausea and upset stomach. Take the antibiotic for the entire course, even if your symptoms resolve before the course if completed. Using antibiotics inappropriately can make it harder to treat future infections. If you have any concerning side effects please stop the medication and let us know immediately. I do recommend taking a probiotic to replenish your good gut health anytime you take an antibiotic. You can get probiotics in types of yogurt like activia, or other food/drinks such as kimchi, kombucha , sauerkraut, or you can take an over the counter supplement.   - If you develop worsening swelling, troubles swallowing or breathing, drooling or swelling comes up to your eyes please call 911.  - Follow up on Friday morning.

## 2019-03-09 NOTE — Progress Notes (Signed)
Name: Jill Davidson   MRN: [id number]    DOB: 07/29/1971   Date:03/09/2019       Progress Note  Subjective  Chief Complaint  Chief Complaint  Patient presents with  . Skin Problem    Knot on right cheek, Onset last wednesday  . Referral    Eye    HPI  Patient feels right upper cheek pain- started 6 days ago and has progressively getting bigger. Denies pain but had some tenderness. Has poor dental hygiene, last dentist appointment was last year. Denies dental pain. Patient endorses mild swelling that has been progressively increasing- tender to palpation.  Denies fevers, chills, trouble swallowing, eye pain.   PHQ2/9: Depression screen [facility] 2/9 03/09/2019 06/03/2017 08/15/2015  Decreased Interest 0 0 0  Down, Depressed, Hopeless 0 0 0  PHQ - 2 Score 0 0 0  Altered sleeping 0 - -  Tired, decreased energy 0 - -  Change in appetite 0 - -  Feeling bad or failure about yourself  0 - -  Trouble concentrating 0 - -  Moving slowly or fidgety/restless 0 - -  Suicidal thoughts 0 - -  PHQ-9 Score 0 - -  Difficult doing work/chores Not difficult at all - -    PHQ reviewed. Negative  Patient Active Problem List   Diagnosis Date Noted  . Tobacco user 07/24/2018  . 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 Vasomotor symptoms due to menopause 05/06/2018  . S/P laparoscopic assisted vaginal hysterectomy (LAVH) bilateral salpingectomy right oophorectomy 03/24/2018  . Obesity (BMI 30.0-34.9) 07/25/2016  . Essential hypertension 07/25/2016  . Status post endometrial ablation 07/23/2016  . IDA (iron deficiency anemia) 01/25/2015    Past Medical History:  Diagnosis Date  . AR (allergic rhinitis)   . Bacterial vaginosis   . Complication of anesthesia 2008   PT STATES THAT DURING C-SECTION SHE WAS ADMINISTERED A GAS THAT CAUSED HER HEART TO BEAT IRREGULAR- PT HAS HAD NO PROBLEMS WITH THIS SINCE 2008   . Dysrhythmia    DUE TO A GAS THAT WAS ADMINISTERED DURING A 2008 C-SECTION-NO PROBLEMS SINCE  . Fibroid   . GERD (gastroesophageal reflux disease)    OCC  . Hypertension   . IDA (iron deficiency anemia) 01/25/2015  . UTI (lower urinary tract infection)     Past Surgical History:  Procedure Laterality Date  . CESAREAN SECTION     X2  . DILATION AND CURETTAGE OF UTERUS    . DILITATION & CURRETTAGE/HYSTROSCOPY WITH NOVASURE ABLATION N/A 07/23/2016   Procedure: DILATATION & CURETTAGE/HYSTEROSCOPY WITH NOVASURE ABLATION;  Surgeon: Brayton Mars, MD;  Location: [facility] ORS;  Service: Gynecology;  Laterality: N/A;  . LAPAROSCOPIC VAGINAL HYSTERECTOMY WITH SALPINGECTOMY Bilateral 03/24/2018   Procedure: LAPAROSCOPIC ASSISTED VAGINAL HYSTERECTOMY WITH BILATERAL SALPINGECTOMY;  Surgeon: Brayton Mars, MD;  Location: [facility] ORS;  Service: Gynecology;  Laterality: Bilateral;    Social History   Tobacco Use  . Smoking status: Current Every Day Smoker    Packs/day: 0.25    Years: 25.00    Pack years: 6.25    Types: Cigarettes    Start date: 07/03/1992  . Smokeless tobacco: Never Used  Substance Use Topics  . Alcohol use: Yes    Comment: every 2-3 weeks     Current Outpatient Medications:  .  fluticasone (FLONASE) 50 MCG/ACT nasal spray, Place 2 sprays into both nostrils daily. (Patient taking differently: Place  2 sprays into both nostrils daily as needed for allergies or rhinitis. ), Disp: 16 g, Rfl: 6 .  hydrochlorothiazide (MICROZIDE) 12.5 MG capsule, TAKE 1 CAPSULE BY MOUTH EVERY DAY, Disp: 30 capsule, Rfl: 0 .  losartan (COZAAR) 25 MG tablet, Take 100 mg by mouth daily. Take with 50 mg dose to equal 75, Disp: , Rfl: 3  No Known Allergies  ROS   No other specific complaints in a complete review of systems (except as listed in HPI above).  Objective  Vitals:   03/09/19 1603  BP: 136/88  Pulse: 74  Resp: 14  Temp: 98.4 F (36.9 C)  TempSrc: Oral  SpO2: 98%   Weight: 220 lb (99.8 kg)  Height: 5\' 4"  (1.626 m)     Body mass index is 37.76 kg/m.  Nursing Note and Vital Signs reviewed.  Physical Exam Vitals signs reviewed.  Constitutional:      Appearance: She is well-developed.  HENT:     Head: Normocephalic and atraumatic.   Neck:     Musculoskeletal: Normal range of motion and neck supple.     Vascular: No carotid bruit.  Cardiovascular:     Rate and Rhythm: Normal rate.  Pulmonary:     Effort: Pulmonary effort is normal.  Abdominal:     Palpations: Abdomen is soft.  Skin:    General: Skin is warm and dry.     Capillary Refill: Capillary refill takes less than 2 seconds.  Neurological:     Mental Status: She is alert and oriented to person, place, and time.     GCS: GCS eye subscore is 4. GCS verbal subscore is 5. GCS motor subscore is 6.     Sensory: No sensory deficit.  Psychiatric:        Mood and Affect: Mood normal.        Speech: Speech normal.        Behavior: Behavior normal.        No results found for this or any previous visit (from the past 48 hour(s)).  Assessment & Plan  1. Dental infection Will follow up in 4 days and monitor improvement and decide if patient needs imaging, discussed ER precautions.  - clindamycin (CLEOCIN) 300 MG capsule; Take 1 capsule (300 mg total) by mouth 3 (three) times daily.  Dispense: 30 capsule; Refill: 0 - Ambulatory referral to Dentistry  2. Maxillary asymmetry - clindamycin (CLEOCIN) 300 MG capsule; Take 1 capsule (300 mg total) by mouth 3 (three) times daily.  Dispense: 30 capsule; Refill: 0  3. Myopia of both eyes - Ambulatory referral to Optometry  4. Complex dental cavity - Ambulatory referral to Dentistry

## 2019-03-13 ENCOUNTER — Other Ambulatory Visit: Payer: Self-pay

## 2019-03-13 ENCOUNTER — Encounter: Payer: Self-pay | Admitting: Nurse Practitioner

## 2019-03-13 ENCOUNTER — Ambulatory Visit (INDEPENDENT_AMBULATORY_CARE_PROVIDER_SITE_OTHER): Payer: BC Managed Care – PPO | Admitting: Nurse Practitioner

## 2019-03-13 VITALS — BP 122/76 | HR 78 | Temp 98.2°F | Resp 14 | Ht 64.0 in | Wt 220.4 lb

## 2019-03-13 DIAGNOSIS — K1120 Sialoadenitis, unspecified: Secondary | ICD-10-CM

## 2019-03-13 DIAGNOSIS — K029 Dental caries, unspecified: Secondary | ICD-10-CM

## 2019-03-13 DIAGNOSIS — Z720 Tobacco use: Secondary | ICD-10-CM

## 2019-03-13 NOTE — Progress Notes (Signed)
Name: Jill Davidson   MRN: [id number]    DOB: 1970/11/05   Date:03/13/2019       Progress Note  Subjective  Chief Complaint  Chief Complaint  Patient presents with  . Follow-up    HPI  Patient has been taking antibiotics as prescribed and significant improvement of swelling is noted. Patient denies pain, fevers, chills.    PHQ2/9: Depression screen [facility] 2/9 03/09/2019 06/03/2017 08/15/2015  Decreased Interest 0 0 0  Down, Depressed, Hopeless 0 0 0  PHQ - 2 Score 0 0 0  Altered sleeping 0 - -  Tired, decreased energy 0 - -  Change in appetite 0 - -  Feeling bad or failure about yourself  0 - -  Trouble concentrating 0 - -  Moving slowly or fidgety/restless 0 - -  Suicidal thoughts 0 - -  PHQ-9 Score 0 - -  Difficult doing work/chores Not difficult at all - -     PHQ reviewed. Negative  Patient Active Problem List   Diagnosis Date Noted  . Tobacco user 07/24/2018  . 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 Vasomotor symptoms due to menopause 05/06/2018  . S/P laparoscopic assisted vaginal hysterectomy (LAVH) bilateral salpingectomy right oophorectomy 03/24/2018  . Obesity (BMI 30.0-34.9) 07/25/2016  . Essential hypertension 07/25/2016  . Status post endometrial ablation 07/23/2016  . IDA (iron deficiency anemia) 01/25/2015    Past Medical History:  Diagnosis Date  . AR (allergic rhinitis)   . Bacterial vaginosis   . Complication of anesthesia 2008   PT STATES THAT DURING C-SECTION SHE WAS ADMINISTERED A GAS THAT CAUSED HER HEART TO BEAT IRREGULAR- PT HAS HAD NO PROBLEMS WITH THIS SINCE 2008  . Dysrhythmia    DUE TO A GAS THAT WAS ADMINISTERED DURING A 2008 C-SECTION-NO PROBLEMS SINCE  . Fibroid   . GERD (gastroesophageal reflux disease)    OCC  . Hypertension   . IDA (iron deficiency anemia) 01/25/2015  . UTI (lower urinary tract infection)     Past Surgical History:   Procedure Laterality Date  . CESAREAN SECTION     X2  . DILATION AND CURETTAGE OF UTERUS    . DILITATION & CURRETTAGE/HYSTROSCOPY WITH NOVASURE ABLATION N/A 07/23/2016   Procedure: DILATATION & CURETTAGE/HYSTEROSCOPY WITH NOVASURE ABLATION;  Surgeon: Brayton Mars, MD;  Location: [facility] ORS;  Service: Gynecology;  Laterality: N/A;  . LAPAROSCOPIC VAGINAL HYSTERECTOMY WITH SALPINGECTOMY Bilateral 03/24/2018   Procedure: LAPAROSCOPIC ASSISTED VAGINAL HYSTERECTOMY WITH BILATERAL SALPINGECTOMY;  Surgeon: Brayton Mars, MD;  Location: [facility] ORS;  Service: Gynecology;  Laterality: Bilateral;    Social History   Tobacco Use  . Smoking status: Current Every Day Smoker    Packs/day: 0.25    Years: 25.00    Pack years: 6.25    Types: Cigarettes    Start date: 07/03/1992  . Smokeless tobacco: Never Used  Substance Use Topics  . Alcohol use: Yes    Comment: every 2-3 weeks     Current Outpatient Medications:  .  clindamycin (CLEOCIN) 300 MG capsule, Take 1 capsule (300 mg total) by mouth 3 (three) times daily., Disp: 30 capsule, Rfl: 0 .  fluticasone (FLONASE) 50 MCG/ACT nasal spray, Place 2 sprays into both nostrils daily. (Patient taking differently: Place 2 sprays into both nostrils daily as needed for allergies or rhinitis. ), Disp: 16 g, Rfl: 6 .  hydrochlorothiazide (MICROZIDE) 12.5 MG capsule, TAKE 1  CAPSULE BY MOUTH EVERY DAY, Disp: 30 capsule, Rfl: 0 .  losartan (COZAAR) 25 MG tablet, Take 100 mg by mouth daily. Take with 50 mg dose to equal 75, Disp: , Rfl: 3  No Known Allergies  ROS    No other specific complaints in a complete review of systems (except as listed in HPI above).  Objective  Vitals:   03/13/19 0829  BP: 122/76  Pulse: 78  Resp: 14  Temp: 98.2 F (36.8 C)  TempSrc: Oral  SpO2: 98%  Weight: 220 lb 6.4 oz (100 kg)  Height: 5\' 4"  (1.626 m)    Body mass index is 37.83 kg/m.  Nursing Note and Vital Signs reviewed.  Physical Exam  Constitutional:      Appearance: Normal appearance.    HENT:     Head: Normocephalic and atraumatic.     Salivary Glands: Right salivary gland is diffusely enlarged. Right salivary gland is not tender.     Nose: Nose normal.  Cardiovascular:     Rate and Rhythm: Normal rate.  Pulmonary:     Effort: Pulmonary effort is normal.  Skin:    General: Skin is warm and dry.     Findings: No erythema or rash.  Neurological:     General: No focal deficit present.     Mental Status: She is alert and oriented to person, place, and time.  Psychiatric:        Mood and Affect: Mood normal.        Behavior: Behavior normal.        No results found for this or any previous visit (from the past 48 hour(s)).  Assessment & Plan  1. Sialadenitis Continue antibiotics, saline garggles, hard candies, ice packs Follow-up on last day of antibiotics to see if needs referral/imgaing/continued antibiotics or if resolved.   2. Tobacco user Recommended cessation  3. Dental caries Will call for dentist appointment today.   -Red flags and when to present for emergency care or RTC including fever >101.21F, chest pain, shortness of breath, new/worsening/un-resolving symptoms, reviewed with patient at time of visit. Follow up and care instructions discussed and provided in AVS.

## 2019-03-13 NOTE — Patient Instructions (Signed)
Your infection is looking much improved. If it worsens, does not continue to improve or you develop fevers or chills- please get immediate care. You can take ibuprofen with food to help with pain Use ice packs to help with swelling. Do saline gargles at least 4 times a day- dissolve half a teaspoon of salt in a full glass of warm water and gargle the solution for a few seconds before spitting it out. Keep a lozenge or hard candy in your mouth to promote saliva secretion  Please call to day to schedule an appointment with a dentist as many of them are booking far out.  I strongly encourage your to quit smoking. I do encourage you to quit smoking Call [phone] to sign up for smoking cessation classes You can call 1-800-QUIT-NOW to talk with a smoking cessation coach

## 2019-03-17 ENCOUNTER — Other Ambulatory Visit: Payer: Self-pay | Admitting: Hematology

## 2019-03-19 ENCOUNTER — Ambulatory Visit (INDEPENDENT_AMBULATORY_CARE_PROVIDER_SITE_OTHER): Payer: BC Managed Care – PPO | Admitting: Family Medicine

## 2019-03-19 ENCOUNTER — Other Ambulatory Visit: Payer: Self-pay

## 2019-03-19 ENCOUNTER — Encounter: Payer: Self-pay | Admitting: Family Medicine

## 2019-03-19 ENCOUNTER — Telehealth: Payer: Self-pay

## 2019-03-19 VITALS — BP 130/84 | HR 77 | Temp 97.1°F | Resp 16 | Ht 64.0 in | Wt 217.2 lb

## 2019-03-19 DIAGNOSIS — I10 Essential (primary) hypertension: Secondary | ICD-10-CM

## 2019-03-19 DIAGNOSIS — R809 Proteinuria, unspecified: Secondary | ICD-10-CM | POA: Diagnosis not present

## 2019-03-19 DIAGNOSIS — R220 Localized swelling, mass and lump, head: Secondary | ICD-10-CM | POA: Diagnosis not present

## 2019-03-19 MED ORDER — LOSARTAN POTASSIUM-HCTZ 100-12.5 MG PO TABS: 1.0000 | ORAL_TABLET | Freq: Every day | ORAL | 1 refills | Status: DC

## 2019-03-19 NOTE — Telephone Encounter (Signed)
Patient was informed that she has been scheduled to have her CT on 03/26/2019 @ 3:30pm at the [facility].

## 2019-03-19 NOTE — Progress Notes (Signed)
Name: Jill Davidson   MRN: [id number]    DOB: 10/16/1970   Date:03/19/2019       Progress Note  Subjective  Chief Complaint  Chief Complaint  Patient presents with  . Sialadentis    6 day follow up. One more antibiotic to take today. No pain. Slight swelling.    HPI  Right facial swelling: she was seen by Clyde Lundborg on 06/22 with right maxillary pain, thought to be a tooth abscess she was given clindamycin and returned in 4 days for follow up and had significant improvement , the pain was down, eye irritation resolved. She is on last day of clindamycin, no toothache, but still has a slightly tender pouch/mass on right maxillary area and is concerned that has not resolved yet. No fever, chills, normal appetite.   HTN: was seeing Dr. Abigail Butts, on losartan 100 and hctz 12.5 mg, bp is at goal, no chest pain or palpitation. We will combine medication   Patient Active Problem List   Diagnosis Date Noted  . Tobacco user 07/24/2018  . 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 Vasomotor symptoms due to menopause 05/06/2018  . S/P laparoscopic assisted vaginal hysterectomy (LAVH) bilateral salpingectomy right oophorectomy 03/24/2018  . Obesity (BMI 30.0-34.9) 07/25/2016  . Essential hypertension 07/25/2016  . Status post endometrial ablation 07/23/2016  . IDA (iron deficiency anemia) 01/25/2015    Past Surgical History:  Procedure Laterality Date  . CESAREAN SECTION     X2  . DILATION AND CURETTAGE OF UTERUS    . DILITATION & CURRETTAGE/HYSTROSCOPY WITH NOVASURE ABLATION N/A 07/23/2016   Procedure: DILATATION & CURETTAGE/HYSTEROSCOPY WITH NOVASURE ABLATION;  Surgeon: Brayton Mars, MD;  Location: [facility] ORS;  Service: Gynecology;  Laterality: N/A;  . LAPAROSCOPIC VAGINAL HYSTERECTOMY WITH SALPINGECTOMY Bilateral 03/24/2018   Procedure: LAPAROSCOPIC ASSISTED VAGINAL HYSTERECTOMY WITH  BILATERAL SALPINGECTOMY;  Surgeon: Brayton Mars, MD;  Location: [facility] ORS;  Service: Gynecology;  Laterality: Bilateral;    Family History  Problem Relation Age of Onset  . Diabetes Father   . Hypertension Father   . Colon cancer Maternal Aunt   . Breast cancer Maternal Aunt        great  . Heart disease Maternal Aunt   . Diabetes Maternal Grandmother   . Hypertension Mother   . Thyroid disease Mother   . Lung cancer Mother   . Asthma Mother   . Hypertension Brother   . Ovarian cancer Neg Hx     Social History   Socioeconomic History  . Marital status: Single    Spouse name: Not on file  . Number of children: Not on file  . Years of education: Not on file  . Highest education level: Not on file  Occupational History  . Not on file  Social Needs  . Financial resource strain: Not on file  . Food insecurity    Worry: Not on file    Inability: Not on file  . Transportation needs    Medical: Not on file    Non-medical: Not on file  Tobacco Use  . Smoking status: Current Every Day Smoker    Packs/day: 0.25    Years: 25.00    Pack years: 6.25    Types: Cigarettes    Start date: 07/03/1992  . Smokeless tobacco: Never Used  Substance and Sexual Activity  . Alcohol use: Yes    Comment: every 2-3 weeks  .  Drug use: Yes    Types: Marijuana    Comment: once a week  . Sexual activity: Yes    Birth control/protection: None    Comment: Ablation  Lifestyle  . Physical activity    Days per week: Not on file    Minutes per session: Not on file  . Stress: Not on file  Relationships  . Social Herbalist on phone: Not on file    Gets together: Not on file    Attends religious service: Not on file    Active member of club or organization: Not on file    Attends meetings of clubs or organizations: Not on file    Relationship status: Not on file  . Intimate partner violence    Fear of current or ex partner: Not on file    Emotionally abused: Not on file     Physically abused: Not on file    Forced sexual activity: Not on file  Other Topics Concern  . 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  clindamycin (CLEOCIN) 300 MG capsule, Take 1 capsule (300 mg total) by mouth 3 (three) times daily., Disp: 30 capsule, Rfl: 0 .  fluticasone (FLONASE) 50 MCG/ACT nasal spray, Place 2 sprays into both nostrils daily. (Patient taking differently: Place 2 sprays into both nostrils daily as needed for allergies or rhinitis. ), Disp: 16 g, Rfl: 6 .  hydrochlorothiazide (MICROZIDE) 12.5 MG capsule, TAKE 1 CAPSULE BY MOUTH EVERY DAY, Disp: 30 capsule, Rfl: 0 .  losartan (COZAAR) 25 MG tablet, Take 100 mg by mouth daily. Take with 50 mg dose to equal 75, Disp: , Rfl: 3  No Known Allergies  I personally reviewed active problem list, medication list, allergies, family history, social history with the patient/caregiver today.   ROS  Constitutional: Negative for fever or weight change.  Respiratory: Negative for cough and shortness of breath.   Cardiovascular: Negative for chest pain or palpitations.  Gastrointestinal: Negative for abdominal pain, no bowel changes.  Musculoskeletal: Negative for gait problem or joint swelling.  Skin: Negative for rash.  Neurological: Negative for dizziness or headache.  No other specific complaints in a complete review of systems (except as listed in HPI above).  Objective  Vitals:   03/19/19 0749  BP: 130/84  Pulse: 77  Resp: 16  Temp: (!) 97.1 F (36.2 C)  TempSrc: Oral  SpO2: 96%  Weight: 217 lb 3.2 oz (98.5 kg)  Height: 5\' 4"  (1.626 m)    Body mass index is 37.28 kg/m.  Physical Exam  Constitutional: Patient appears well-developed and well-nourished. Obese  No distress.  HEENT: head atraumatic, normocephalic, pupils equal and reactive to  light, e, neck supple, slightly tender right maxillary area, no redness or increase in warmth, no near the ear.  Cardiovascular: Normal rate, regular rhythm and normal heart sounds.  No murmur heard. No BLE edema. Pulmonary/Chest: Effort normal and breath sounds normal. No respiratory distress. Abdominal: Soft.  There is no tenderness. Psychiatric: Patient has a normal mood and affect. behavior is normal. Judgment and thought content normal.  PHQ2/9: Depression screen [facility] 2/9 03/19/2019 03/13/2019 03/09/2019 06/03/2017 08/15/2015  Decreased Interest 0 0 0 0 0  Down, Depressed, Hopeless 0 0 0 0 0  PHQ - 2 Score 0  0 0 0 0  Altered sleeping 0 0 0 - -  Tired, decreased energy 0 0 0 - -  Change in appetite 0 0 0 - -  Feeling bad or failure about yourself  0 0 0 - -  Trouble concentrating 0 0 0 - -  Moving slowly or fidgety/restless 0 0 0 - -  Suicidal thoughts 0 0 0 - -  PHQ-9 Score 0 0 0 - -  Difficult doing work/chores - Not difficult at all Not difficult at all - -    phq 9 is negative   Fall Risk: Fall Risk  03/19/2019 03/13/2019 03/09/2019 06/03/2017 08/15/2015  Falls in the past year? 0 0 0 No No  Number falls in past yr: 0 0 0 - -  Injury with Fall? 0 0 0 - -     Assessment & Plan  1. Localized swelling, mass, and lump of head  - CT Maxillofacial WO CM; Future - Ambulatory referral to ENT  2. Right facial swelling  - CT Maxillofacial WO CM; Future - Ambulatory referral to ENT  3. Microalbuminuria  - losartan-hydrochlorothiazide (HYZAAR) 100-12.5 MG tablet; Take 1 tablet by mouth daily.  Dispense: 90 tablet; Refill: 1  4. Hypertension, benign  - losartan-hydrochlorothiazide (HYZAAR) 100-12.5 MG tablet; Take 1 tablet by mouth daily.  Dispense: 90 tablet; Refill: 1

## 2019-03-23 ENCOUNTER — Telehealth: Payer: Self-pay

## 2019-03-23 ENCOUNTER — Other Ambulatory Visit: Payer: Self-pay | Admitting: Family Medicine

## 2019-03-23 DIAGNOSIS — R220 Localized swelling, mass and lump, head: Secondary | ICD-10-CM

## 2019-03-23 NOTE — Telephone Encounter (Signed)
Copied from [place] [phone]. Topic: General - Other >> Mar 23, 2019  2:54 PM Mcneil, Ja-Kwan wrote: Reason for CRM: Ginger with [facility] stated pt has an appt on Thursday and a new order for CT with contrast is needed. Cb# [phone]

## 2019-03-24 NOTE — Telephone Encounter (Signed)
This has previously been taken care of.

## 2019-03-26 ENCOUNTER — Ambulatory Visit: Admission: RE | Admit: 2019-03-26 | Payer: BC Managed Care – PPO | Source: Ambulatory Visit

## 2019-03-27 ENCOUNTER — Telehealth: Payer: Self-pay

## 2019-03-27 NOTE — Telephone Encounter (Signed)
Copied from [place] [id number]. Topic: Referral - Status >> Mar 26, 2019  3:46 PM Rutherford Nail, NT wrote: Reason for CRM: Patient calling and states that she was referred to an ENT and her appointment was supposed to be today. States that they were going to charge her $273 to be seen. States that she cannot pay that. Patient just wanted to inform Dr Ancil Boozer that she was not seen by an ENT. States that she will contact her insurance and see where she could go that would be cheaper and would give the office a call back tomorrow.

## 2019-06-24 ENCOUNTER — Encounter: Payer: BC Managed Care – PPO | Admitting: Family Medicine

## 2019-09-10 ENCOUNTER — Other Ambulatory Visit (HOSPITAL_COMMUNITY)
Admission: RE | Admit: 2019-09-10 | Discharge: 2019-09-10 | Disposition: A | Discharge status: Home / Self Care | Payer: BC Managed Care – PPO | Source: Ambulatory Visit | Attending: Family Medicine | Admitting: Family Medicine

## 2019-09-10 ENCOUNTER — Other Ambulatory Visit: Payer: Self-pay

## 2019-09-10 ENCOUNTER — Ambulatory Visit (INDEPENDENT_AMBULATORY_CARE_PROVIDER_SITE_OTHER): Payer: Medicaid Other | Admitting: Family Medicine

## 2019-09-10 ENCOUNTER — Encounter: Payer: Self-pay | Admitting: Family Medicine

## 2019-09-10 VITALS — BP 140/80 | HR 85 | Temp 97.3°F | Resp 16 | Ht 64.0 in | Wt 214.7 lb

## 2019-09-10 DIAGNOSIS — Z23 Encounter for immunization: Secondary | ICD-10-CM

## 2019-09-10 DIAGNOSIS — Z1211 Encounter for screening for malignant neoplasm of colon: Secondary | ICD-10-CM

## 2019-09-10 DIAGNOSIS — Z0000 Encounter for general adult medical examination without abnormal findings: Secondary | ICD-10-CM

## 2019-09-10 DIAGNOSIS — Z1231 Encounter for screening mammogram for malignant neoplasm of breast: Secondary | ICD-10-CM

## 2019-09-10 DIAGNOSIS — Z131 Encounter for screening for diabetes mellitus: Secondary | ICD-10-CM

## 2019-09-10 DIAGNOSIS — Z720 Tobacco use: Secondary | ICD-10-CM | POA: Diagnosis not present

## 2019-09-10 DIAGNOSIS — R809 Proteinuria, unspecified: Secondary | ICD-10-CM

## 2019-09-10 DIAGNOSIS — Z862 Personal history of diseases of the blood and blood-forming organs and certain disorders involving the immune mechanism: Secondary | ICD-10-CM

## 2019-09-10 DIAGNOSIS — Z13220 Encounter for screening for lipoid disorders: Secondary | ICD-10-CM

## 2019-09-10 DIAGNOSIS — Z113 Encounter for screening for infections with a predominantly sexual mode of transmission: Secondary | ICD-10-CM

## 2019-09-10 DIAGNOSIS — I10 Essential (primary) hypertension: Secondary | ICD-10-CM

## 2019-09-10 MED ORDER — LOSARTAN POTASSIUM-HCTZ 100-12.5 MG PO TABS: 1.0000 | ORAL_TABLET | Freq: Every day | ORAL | 1 refills | Status: DC

## 2019-09-10 NOTE — Patient Instructions (Signed)

## 2019-09-10 NOTE — Progress Notes (Signed)
Name: Jill Davidson   MRN: [id number]    DOB: 26-Feb-1971   Date:09/10/2019       Progress Note  Subjective  Chief Complaint  Chief Complaint  Patient presents with  . Annual Exam    HPI  Patient presents for annual CPE and follow up   History of anemia: had hysterectomy in 2019 and anemia resolved, we will recheck labs today. Denies pica, no sob   Tobacco use: still smokes, but working 12 hour shifts and is down from one pack daily to half pack daily and also avoiding smoking when watching her grandson.   Diet: she is taking keto pills, advised to stop because of bp. Discussed healthier diet and increase physical activity  Exercise: needs to increase physical activity   USPSTF grade A and B recommendations    Office Visit from 09/10/2019 in [facility]  AUDIT-C Score  5     Depression: Phq 9 is  negative Depression screen [facility] 2/9 09/10/2019 03/19/2019 03/13/2019 03/09/2019 06/03/2017  Decreased Interest 0 0 0 0 0  Down, Depressed, Hopeless 0 0 0 0 0  PHQ - 2 Score 0 0 0 0 0  Altered sleeping 0 0 0 0 -  Tired, decreased energy 0 0 0 0 -  Change in appetite 0 0 0 0 -  Feeling bad or failure about yourself  0 0 0 0 -  Trouble concentrating 0 0 0 0 -  Moving slowly or fidgety/restless 0 0 0 0 -  Suicidal thoughts 0 0 0 0 -  PHQ-9 Score 0 0 0 0 -  Difficult doing work/chores - - Not difficult at all Not difficult at all -   Hypertension: BP Readings from Last 3 Encounters:  09/10/19 140/80  03/19/19 130/84  03/13/19 122/76   Obesity: Wt Readings from Last 3 Encounters:  09/10/19 214 lb 11.2 oz (97.4 kg)  03/19/19 217 lb 3.2 oz (98.5 kg)  03/13/19 220 lb 6.4 oz (100 kg)   BMI Readings from Last 3 Encounters:  09/10/19 36.85 kg/m  03/19/19 37.28 kg/m  03/13/19 37.83 kg/m     Hep C Screening: up to date  STD testing and prevention (HIV/chl/gon/syphilis): today  Intimate partner violence: negative  Sexual History  (Partners/Practices/Protection from [employer] hx STI/Pregnancy Plans): one partner in the past year, not using condoms, s/p hysterectomy, no history of STI in the past year. Never had outbreak of herpes, diagnosed with a lab before hysterectomy  Pain during Intercourse: no pain  Menstrual History/LMP/Abnormal Bleeding: used to be heavy and had a hysterectomy in 2019  Incontinence Symptoms: she has urinary urgency since hysterectomy , she does not want to try medication  Breast cancer:  - Last Mammogram: ordered today  - BRCA gene screening: N/A  Osteoporosis: Discussed high calcium and vitamin D supplementation, weight bearing exercises  Cervical cancer screening: s/p hysterectomy   Skin cancer: Discussed monitoring for atypical lesions  Colorectal cancer: discussed USPTF Lung cancer:  Low Dose CT Chest recommended if Age 21-80 years, 30 pack-year currently smoking OR have quit w/in 15years. Patient does not qualify.   ECG: 03/2018   Advanced Care Planning: A voluntary discussion about advance care planning including the explanation and discussion of advance directives.  Discussed health care proxy and Living will, and the patient was able to identify a health care proxy as daughter - Renard Hamper.  Patient does not have a living will at present time.    Lipids: Lab Results  Component  Value Date   CHOL 146 06/03/2017   CHOL 154 07/25/2016   CHOL 138 08/15/2015   Lab Results  Component Value Date   HDL 44 (L) 06/03/2017   HDL 37 (L) 07/25/2016   HDL 45 08/15/2015   Lab Results  Component Value Date   LDLCALC 86 06/03/2017   LDLCALC 104 (H) 07/25/2016   LDLCALC 80 08/15/2015   Lab Results  Component Value Date   TRIG 71 06/03/2017   TRIG 64 07/25/2016   TRIG 66 08/15/2015   Lab Results  Component Value Date   CHOLHDL 3.3 06/03/2017   CHOLHDL 4.2 07/25/2016   CHOLHDL 3.1 08/15/2015   No results found for: LDLDIRECT  Glucose: Glucose  Date Value Ref Range Status   08/02/2017 86 70 - 140 mg/dl Final    Comment:    Glucose reference range is for nonfasting patients. Fasting glucose reference range is 70- 100.   Glucose, Bld  Date Value Ref Range Status  02/24/2018 112 70 - 140 mg/dL Final  01/16/2018 91 70 - 140 mg/dL Final  10/22/2017 96 70 - 140 mg/dL Final    Patient Active Problem List   Diagnosis Date Noted  . Tobacco user 07/24/2018  . 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 Vasomotor symptoms due to menopause 05/06/2018  . S/P laparoscopic assisted vaginal hysterectomy (LAVH) bilateral salpingectomy right oophorectomy 03/24/2018  . Obesity (BMI 30.0-34.9) 07/25/2016  . Essential hypertension 07/25/2016  . Status post endometrial ablation 07/23/2016  . IDA (iron deficiency anemia) 01/25/2015    Past Surgical History:  Procedure Laterality Date  . CESAREAN SECTION     X2  . DILATION AND CURETTAGE OF UTERUS    . DILITATION & CURRETTAGE/HYSTROSCOPY WITH NOVASURE ABLATION N/A 07/23/2016   Procedure: DILATATION & CURETTAGE/HYSTEROSCOPY WITH NOVASURE ABLATION;  Surgeon: Brayton Mars, MD;  Location: [facility] ORS;  Service: Gynecology;  Laterality: N/A;  . LAPAROSCOPIC VAGINAL HYSTERECTOMY WITH SALPINGECTOMY Bilateral 03/24/2018   Procedure: LAPAROSCOPIC ASSISTED VAGINAL HYSTERECTOMY WITH BILATERAL SALPINGECTOMY;  Surgeon: Brayton Mars, MD;  Location: [facility] ORS;  Service: Gynecology;  Laterality: Bilateral;    Family History  Problem Relation Age of Onset  . Diabetes Father   . Hypertension Father   . Colon cancer Maternal Aunt   . Breast cancer Maternal Aunt        great  . Heart disease Maternal Aunt   . Diabetes Maternal Grandmother   . Hypertension Mother   . Thyroid disease Mother   . Lung cancer Mother   . Asthma Mother   . Hypertension Brother   . Ovarian cancer Neg Hx     Social History   Socioeconomic History  .  Marital status: Single    Spouse name: Not on file  . Number of children: Not on file  . Years of education: Not on file  . Highest education level: Not on file  Occupational History  . Not on file  Tobacco Use  . Smoking status: Current Every Day Smoker    Packs/day: 0.50    Years: 25.00    Pack years: 12.50    Types: Cigarettes    Start date: 07/03/1992  . Smokeless tobacco: Never Used  Substance and Sexual Activity  . Alcohol use: Yes    Comment: every 2-3 weeks  . Drug use: Yes    Types: Marijuana    Comment: once a week  . Sexual activity:  Yes    Birth control/protection: None    Comment: Ablation  Other Topics Concern  . 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 Difficulty of Paying Living Expenses: Somewhat hard  Food Insecurity: No Food Insecurity  . Worried About Charity fundraiser in the Last Year: Never true  . Ran Out of Food in the Last Year: Never true  Transportation Needs: No Transportation Needs  . Lack of Transportation (Medical): No  . Lack of Transportation (Non-Medical): No  Physical Activity: Insufficiently Active  . Days of Exercise per Week: 1 day  . Minutes of Exercise per Session: 40 min  Stress: No Stress Concern Present  . Feeling of Stress : Not at all  Social Connections: Slightly Isolated  . Frequency of Communication with Friends and Family: More than three times a week  . Frequency of Social Gatherings with Friends and Family: More than three times a week  . Attends Religious Services: More than 4 times per year  . Active Member of Clubs or Organizations: Yes  . Attends Archivist Meetings: More than 4 times per year  . Marital Status: Never married  Intimate Partner Violence: Not At Risk  . Fear of Current or  Ex-Partner: No  . Emotionally Abused: No  . Physically Abused: No  . Sexually Abused: No     Current Outpatient Medications:  .  fluticasone (FLONASE) 50 MCG/ACT nasal spray, Place 2 sprays into both nostrils daily. (Patient taking differently: Place 2 sprays into both nostrils daily as needed for allergies or rhinitis. ), Disp: 16 g, Rfl: 6 .  losartan-hydrochlorothiazide (HYZAAR) 100-12.5 MG tablet, Take 1 tablet by mouth daily., Disp: 90 tablet, Rfl: 1  No Known Allergies   ROS  Constitutional: Negative for fever or weight change.  Respiratory: Negative for cough and shortness of breath.   Cardiovascular: Negative for chest pain or palpitations.  Gastrointestinal: Negative for abdominal pain, no bowel changes.  Musculoskeletal: Negative for gait problem or joint swelling.  Skin: Negative for rash.  Neurological: Negative for dizziness or headache.  No other specific complaints in a complete review of systems (except as listed in HPI above).  Objective  Vitals:   09/10/19 0908  BP: 140/80  Pulse: 85  Resp: 16  Temp: (!) 97.3 F (36.3 C)  TempSrc: Temporal  SpO2: 97%  Weight: 214 lb 11.2 oz (97.4 kg)  Height: 5' 4" (1.626 m)    Body mass index is 36.85 kg/m.  Physical Exam  Constitutional: Patient appears well-developed and well-nourished. No distress.  HENT: Head: Normocephalic and atraumatic. Ears: B TMs ok, no erythema or effusion; Nose: Nose normal. Mouth/Throat: not done Eyes: Conjunctivae and EOM are normal. Pupils are equal, round, and reactive to light. No scleral icterus.  Neck: Normal range of motion. Neck supple. No JVD present. No thyromegaly present.  Cardiovascular: Normal rate, regular rhythm and normal heart sounds.  No murmur heard. No BLE edema. Pulmonary/Chest: Effort normal and breath sounds normal. No respiratory distress. Abdominal: Soft. Bowel sounds are normal, no distension. There is no tenderness. no masses Breast: no lumps or masses, no  nipple discharge or rashes FEMALE GENITALIA:  External genitalia normal External urethra a little irritated Vaginal vault normal without  discharge or lesions Cervix not present Bimanual exam normal without masses RECTAL: not done Musculoskeletal: Normal range of motion, no joint effusions. No gross deformities Neurological: he is alert and oriented to person, place, and time. No cranial nerve deficit. Coordination, balance, strength, speech and gait are normal.  Skin: Skin is warm and dry. No rash noted. No erythema.  Psychiatric: Patient has a normal mood and affect. behavior is normal. Judgment and thought content normal.  Fall Risk: Fall Risk  09/10/2019 03/19/2019 03/13/2019 03/09/2019 06/03/2017  Falls in the past year? 0 0 0 0 No  Number falls in past yr: 0 0 0 0 -  Injury with Fall? 0 0 0 0 -     Functional Status Survey: Is the patient deaf or have difficulty hearing?: No Does the patient have difficulty seeing, even when wearing glasses/contacts?: No Does the patient have difficulty concentrating, remembering, or making decisions?: No Does the patient have difficulty walking or climbing stairs?: No Does the patient have difficulty dressing or bathing?: No Does the patient have difficulty doing errands alone such as visiting a doctor's office or shopping?: No   Assessment & Plan  1. Microalbuminuria  Follow up with Dr. Abigail Butts  2. Hypertension, benign  Return to discuss it in 3 months  3. Well adult exam   4. Tobacco user  She is trying to quit smoking   5. Screening cholesterol level  - Lipid panel  6. Screening for diabetes mellitus  - Hemoglobin A1c  7. History of iron deficiency anemia  - CBC with Differential/Platelet - Iron, TIBC and Ferritin Panel  8. Needs flu shot  - Flu Vaccine QUAD 6+ mos PF IM (Fluarix Quad PF)  9. Need for Tdap vaccination  - Tdap vaccine greater than or equal to 7yo IM  10. Need for 23-polyvalent pneumococcal  polysaccharide vaccine  - Pneumococcal polysaccharide vaccine 23-valent greater than or equal to 2yo subcutaneous/IM  11. Routine screening for STI (sexually transmitted infection)  - RPR - HIV Antibody (routine testing w rflx) - Cervicovaginal ancillary only  12. Colon cancer screening  - Cologuard  13. Breast cancer screening by mammogram  - MM Digital Screening; Future  -USPSTF grade A and B recommendations reviewed with patient; age-appropriate recommendations, preventive care, screening tests, etc discussed and encouraged; healthy living encouraged; see AVS for patient education given to patient -Discussed importance of 150 minutes of physical activity weekly, eat two servings of fish weekly, eat one serving of tree nuts ( cashews, pistachios, pecans, almonds.Marland Kitchen) every other day, eat 6 servings of fruit/vegetables daily and drink plenty of water and avoid sweet beverages.

## 2019-09-15 LAB — CERVICOVAGINAL ANCILLARY ONLY
Chlamydia: NEGATIVE
Comment: NEGATIVE
Comment: NEGATIVE
Comment: NORMAL
Neisseria Gonorrhea: NEGATIVE
Trichomonas: NEGATIVE

## 2019-09-21 ENCOUNTER — Ambulatory Visit: Payer: BC Managed Care – PPO | Admitting: Family Medicine

## 2019-10-20 ENCOUNTER — Encounter: Payer: Self-pay | Admitting: Family Medicine

## 2019-10-20 ENCOUNTER — Ambulatory Visit (INDEPENDENT_AMBULATORY_CARE_PROVIDER_SITE_OTHER): Payer: Medicaid Other | Admitting: Family Medicine

## 2019-10-20 VITALS — BP 160/80 | Ht 64.0 in

## 2019-10-20 DIAGNOSIS — B349 Viral infection, unspecified: Secondary | ICD-10-CM

## 2019-10-20 DIAGNOSIS — I10 Essential (primary) hypertension: Secondary | ICD-10-CM

## 2019-10-20 LAB — NOVEL CORONAVIRUS, NAA: SARS-CoV-2, NAA: NEGATIVE

## 2019-10-20 NOTE — Progress Notes (Addendum)
Name: Jill Davidson   MRN: [id number]    DOB: February 24, 1971   Date:10/20/2019       Progress Note  Subjective  Chief Complaint  Chief Complaint  Patient presents with  . Nasal Congestion    Onset-1 week, nasal spray and Robitussin but has not helped her congestion  . Generalized Body Aches  . Chills  . Ear Fullness    Right side of her face has been swollen, Pain in her right ear states her ear feels stopped up.    I connected with  [name] on 10/20/19 at 11:00 AM EST by telephone and verified that I am speaking with the correct person using two identifiers.  I discussed the limitations, risks, security and privacy concerns of performing an evaluation and management service by telephone and the availability of in person appointments. Staff also discussed with the patient that there may be a patient responsible charge related to this service. Patient Location: at home Provider Location: [facility]   HPI  Viral Illness: she states she developed neck and shoulder pain 6 days ago, followed by fatigue, body aches and chills so she has not been back to work since. She has also noticed facial fullness, congested, and right ear feels stopped up. She has a lack of appetite, but not change in sense of smell or taste. She states in the beginning had some diarrhea - described more frequent stools and softer than usual. No nausea or vomiting. She works at [employer] and had some co-workers that have been out because of [place]. She has been self quarantine since symptoms started. We will send her to be checked . She has missed three days at Work - Friday/Saturday and Sunday  HTN: bp is high, advised to take Tylenol and take Coricidin HBP instead of Robutissin and also use saline spray   Patient Active Problem List   Diagnosis Date Noted  . Tobacco user 07/24/2018  . 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 Polymerase chain reaction DNA test positive for herpes simplex virus type 1  (HSV-1) 06/05/2018  . Vasomotor symptoms due to menopause 05/06/2018  . S/P laparoscopic assisted vaginal hysterectomy (LAVH) bilateral salpingectomy right oophorectomy 03/24/2018  . Obesity (BMI 30.0-34.9) 07/25/2016  . Essential hypertension 07/25/2016  . Status post endometrial ablation 07/23/2016  . IDA (iron deficiency anemia) 01/25/2015    Past Surgical History:  Procedure Laterality Date  . CESAREAN SECTION     X2  . DILATION AND CURETTAGE OF UTERUS    . DILITATION & CURRETTAGE/HYSTROSCOPY WITH NOVASURE ABLATION N/A 07/23/2016   Procedure: DILATATION & CURETTAGE/HYSTEROSCOPY WITH NOVASURE ABLATION;  Surgeon: Brayton Mars, MD;  Location: [facility] ORS;  Service: Gynecology;  Laterality: N/A;  . LAPAROSCOPIC VAGINAL HYSTERECTOMY WITH SALPINGECTOMY Bilateral 03/24/2018   Procedure: LAPAROSCOPIC ASSISTED VAGINAL HYSTERECTOMY WITH BILATERAL SALPINGECTOMY;  Surgeon: Brayton Mars, MD;  Location: [facility] ORS;  Service: Gynecology;  Laterality: Bilateral;    Family History  Problem Relation Age of Onset  . Diabetes Father   . Hypertension Father   . Colon cancer Maternal Aunt   . Breast cancer Maternal Aunt        great  . Heart disease Maternal Aunt   . Diabetes Maternal Grandmother   . Hypertension Mother   . Thyroid disease Mother   . Lung cancer Mother   . Asthma Mother   . Hypertension Brother   . Ovarian cancer Neg Hx       Current Outpatient Medications:  .  acetaminophen (TYLENOL) 325 MG tablet, Take 650 mg by mouth every 6 (six) hours as needed., Disp: , Rfl:  .  fluticasone (FLONASE) 50 MCG/ACT nasal spray, Place 2 sprays into both nostrils daily. (Patient taking differently: Place 2 sprays into both nostrils daily as needed for allergies or rhinitis. ), Disp: 16 g, Rfl: 6 .  losartan-hydrochlorothiazide (HYZAAR) 100-12.5 MG tablet, Take 1 tablet by mouth daily., Disp: 90 tablet, Rfl: 1 .  naproxen sodium (ALEVE) 220 MG tablet, Take 220 mg by mouth daily as  needed., Disp: , Rfl:   No Known Allergies  I personally reviewed active problem list, medication list, allergies, family history, social history with the patient/caregiver today.   ROS  Ten systems reviewed and is negative except as mentioned in HPI   Objective  Virtual encounter, vitals  Obtained at home  Today's Vitals   10/20/19 0900 10/20/19 0901  BP: (!) 160/80   Height: 5\' 4"  (1.626 m)   PainSc:  5    Body mass index is 36.85 kg/m.Marland Kitchen   Physical Exam  Awake, alert and oriented  PHQ2/9: Depression screen [facility] 2/9 10/20/2019 09/10/2019 03/19/2019 03/13/2019 03/09/2019  Decreased Interest 0 0 0 0 0  Down, Depressed, Hopeless 0 0 0 0 0  PHQ - 2 Score 0 0 0 0 0  Altered sleeping 0 0 0 0 0  Tired, decreased energy 0 0 0 0 0  Change in appetite 0 0 0 0 0  Feeling bad or failure about yourself  0 0 0 0 0  Trouble concentrating 0 0 0 0 0  Moving slowly or fidgety/restless 0 0 0 0 0  Suicidal thoughts 0 0 0 0 0  PHQ-9 Score 0 0 0 0 0  Difficult doing work/chores - - - Not difficult at all Not difficult at all   PHQ-2/9 Result is negative.    Fall Risk: Fall Risk  10/20/2019 09/10/2019 03/19/2019 03/13/2019 03/09/2019  Falls in the past year? 0 0 0 0 0  Number falls in past yr: 0 0 0 0 0  Injury with Fall? 0 0 0 0 0    Assessment & Plan  1. Viral illness  - Novel Coronavirus, NAA ([facility]) Explained likely COVID and to stay in quarantine, stay hydrated ,get checked, may return to work on Monday if symptoms improves and needs to get thermometer to make sure not fever without anti-pyretics prior to going back. We will contact her back with results. Currently no cough, SOB or wheezing  2. Hypertension, benign  Stop medications that can raise bp  I discussed the assessment and treatment plan with the patient. The patient was provided an opportunity to ask questions and all were answered. The patient agreed with the plan and demonstrated an understanding of the instructions.    The patient was advised to call back or seek an in-person evaluation if the symptoms worsen or if the condition fails to improve as anticipated.  I provided 15  minutes of non-face-to-face time during this encounter.  Loistine Chance, MD

## 2019-10-26 ENCOUNTER — Encounter: Payer: Self-pay | Admitting: Family Medicine

## 2019-12-09 ENCOUNTER — Ambulatory Visit: Payer: Medicaid Other | Admitting: Family Medicine

## 2020-01-08 ENCOUNTER — Ambulatory Visit: Payer: Medicaid Other | Attending: Internal Medicine

## 2020-01-08 ENCOUNTER — Other Ambulatory Visit: Payer: Self-pay

## 2020-01-08 DIAGNOSIS — Z23 Encounter for immunization: Secondary | ICD-10-CM

## 2020-01-08 NOTE — Progress Notes (Signed)
   Covid-19 Vaccination Clinic  Name:  Jill Davidson    MRN: [id number] DOB: [birth year]  01/08/2020  Jill Davidson was observed post Covid-19 immunization for 15 minutes without incident. She was provided with Vaccine Information Sheet and instruction to access the V-Safe system.   Jill Davidson was instructed to call 911 with any severe reactions post vaccine: Marland Kitchen Difficulty breathing  . Swelling of face and throat  . A fast heartbeat  . A bad rash all over body  . Dizziness and weakness   Immunizations Administered    Name Date Dose VIS Date Route   Pfizer COVID-19 Vaccine 01/08/2020  8:32 AM 0.3 mL 11/11/2018 Intramuscular   Manufacturer: [place]   Lot: [id number]   [place]: [id number]

## 2020-01-30 ENCOUNTER — Other Ambulatory Visit: Payer: Self-pay

## 2020-01-30 ENCOUNTER — Ambulatory Visit: Payer: Medicaid Other | Attending: Internal Medicine

## 2020-01-30 DIAGNOSIS — Z23 Encounter for immunization: Secondary | ICD-10-CM

## 2020-01-30 NOTE — Progress Notes (Signed)
   Covid-19 Vaccination Clinic  Name:  Jill Davidson    MRN: [id number] DOB: 03/14/1971  01/30/2020  [name] was observed post Covid-19 immunization for 15 minutes without incident. She was provided with Vaccine Information Sheet and instruction to access the V-Safe system.   [name] was instructed to call 911 with any severe reactions post vaccine: Marland Kitchen Difficulty breathing  . Swelling of face and throat  . A fast heartbeat  . A bad rash all over body  . Dizziness and weakness   Immunizations Administered    Name Date Dose VIS Date Route   Pfizer COVID-19 Vaccine 01/30/2020  9:22 AM 0.3 mL 11/11/2018 Intramuscular   Manufacturer: [place]   Lot: [id number]   [place]: [id number]

## 2020-02-24 ENCOUNTER — Other Ambulatory Visit: Payer: Self-pay | Admitting: Family Medicine

## 2020-02-24 DIAGNOSIS — I10 Essential (primary) hypertension: Secondary | ICD-10-CM

## 2020-02-24 DIAGNOSIS — R809 Proteinuria, unspecified: Secondary | ICD-10-CM

## 2020-05-05 ENCOUNTER — Telehealth: Payer: Self-pay | Admitting: Family Medicine

## 2020-05-05 NOTE — Telephone Encounter (Signed)
Patient calling to inquire if Dr. Ancil Boozer would be willing to call in RX for congestion, runny nose, watery eyes and nausea. Patient stated she did not want an appointment at this time.

## 2020-05-09 ENCOUNTER — Telehealth (INDEPENDENT_AMBULATORY_CARE_PROVIDER_SITE_OTHER): Payer: Medicaid Other | Admitting: Family Medicine

## 2020-05-09 ENCOUNTER — Encounter: Payer: Self-pay | Admitting: Family Medicine

## 2020-05-09 ENCOUNTER — Other Ambulatory Visit: Payer: Self-pay

## 2020-05-09 VITALS — Ht 63.0 in | Wt 200.0 lb

## 2020-05-09 DIAGNOSIS — J209 Acute bronchitis, unspecified: Secondary | ICD-10-CM

## 2020-05-09 DIAGNOSIS — J069 Acute upper respiratory infection, unspecified: Secondary | ICD-10-CM

## 2020-05-09 MED ORDER — BENZONATATE 100 MG PO CAPS: 100.0000 mg | ORAL_CAPSULE | Freq: Three times a day (TID) | ORAL | 0 refills | Status: DC | PRN

## 2020-05-09 MED ORDER — PREDNISONE 20 MG PO TABS: 40.0000 mg | ORAL_TABLET | Freq: Every day | ORAL | 0 refills | Status: AC

## 2020-05-09 NOTE — Progress Notes (Signed)
Name: Jill Davidson   MRN: [id number]    DOB: [birth year]   Date:05/09/2020       Progress Note  Subjective:    Chief Complaint  Chief Complaint  Patient presents with  . Nasal Congestion    Symptoms started about a week ago, she had a covid test on 04/25/20 and it was negative  . Fatigue  . Cough    I connected with  Jill Davidson  on 05/09/20 at  9:00 AM EDT by a video enabled telemedicine application and verified that I am speaking with the correct person using two identifiers.  I discussed the limitations of evaluation and management by telemedicine and the availability of in person appointments. The patient expressed understanding and agreed to proceed. Staff also discussed with the patient that there may be a patient responsible charge related to this service. Patient Location: home Provider Location: home office Additional Individuals present: none  HPI URI and cough sx, started as what pt suspected was allergies, sneezing, nasal drainage and watery eyes, worsened with nasal congestion, chest congestion, productive cough with yellow to green sputum with some rattling in her chest, some faint wheeze and generalized fatigue and malaise. No fever, sweats, CP.  Sx started over a week ago - the weekend of the 14 th & 15th.  She reports a neg covid test prior to that on the 9th.  Her grand baby was sick She denies hx of asthma or bronchitis.  Denies any facial/sinus pain.  All congestion is worse when laying down. She wants a work note She is vaccinated for COVID Current tobacco user, 1/2 ppd for 12+ years   Patient Active Problem List   Diagnosis Date Noted  . Tobacco user 07/24/2018  . 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 Vasomotor symptoms due to menopause 05/06/2018  . S/P laparoscopic assisted vaginal hysterectomy (LAVH) bilateral salpingectomy right oophorectomy 03/24/2018  .  Obesity (BMI 30.0-34.9) 07/25/2016  . Essential hypertension 07/25/2016  . Status post endometrial ablation 07/23/2016  . IDA (iron deficiency anemia) 01/25/2015    Social History   Tobacco Use  . Smoking status: Current Every Day Smoker    Packs/day: 0.50    Years: 25.00    Pack years: 12.50    Types: Cigarettes    Start date: 07/03/1992  . Smokeless tobacco: Never Used  Substance Use Topics  . Alcohol use: Yes    Comment: every 2-3 weeks     Current Outpatient Medications:  .  acetaminophen (TYLENOL) 325 MG tablet, Take 650 mg by mouth every 6 (six) hours as needed., Disp: , Rfl:  .  losartan-hydrochlorothiazide (HYZAAR) 100-12.5 MG tablet, TAKE 1 TABLET BY MOUTH EVERY DAY, Disp: 60 tablet, Rfl: 0 .  fluticasone (FLONASE) 50 MCG/ACT nasal spray, Place 2 sprays into both nostrils daily. (Patient not taking: Reported on 05/09/2020), Disp: 16 g, Rfl: 6  No Known Allergies  I personally reviewed active problem list, medication list, allergies, family history, social history, health maintenance, notes from last encounter, lab results, imaging with the patient/caregiver today.   Review of Systems  10 Systems reviewed and are negative for acute change except as noted in the HPI.   Objective:   Virtual encounter, vitals limited, only able to obtain the following Today's Vitals   05/09/20 0832  Weight: 200 lb (90.7 kg)  Height: 5\' 3"  (1.6 m)   Body mass index is  35.43 kg/m. Nursing Note and Vital Signs reviewed.  Physical Exam Vitals and nursing note reviewed.  Constitutional:      General: She is not in acute distress.    Appearance: Normal appearance. She is obese. She is not ill-appearing, toxic-appearing or diaphoretic.  Pulmonary:     Effort: No respiratory distress.  Neurological:     Mental Status: She is alert.  Psychiatric:        Mood and Affect: Mood normal.        Behavior: Behavior normal.     PE limited by telephone encounter  No results found  for this or any previous visit (from the past 72 hour(s)).  Assessment and Plan:     ICD-10-CM   1. Acute bronchitis, unspecified organism  J20.9 benzonatate (TESSALON) 100 MG capsule    predniSONE (DELTASONE) 20 MG tablet  2. Upper respiratory tract infection, unspecified type  J06.9     Supportive and symptomatic tx, rest, push fluids, pt encouraged to use mucinex, robitussin and other allergy/cold meds as tolerated.  Work note provided  Pt encouraged to get COVID test and still quarantine - COVID PCR or molecular test  Sx onset on 14th or 15th, if covid test is neg and no fever for 24 hours and sx improving pt would be cleared back to work on the [employer] asked to f/up with pt and review prescribed meds, ensure she was able to test, and pt was told very clearly that we would need to be in touch in the next couple days with test results to be able to clear her back to work per [employer] guidelines.    -Red flags and when to present for emergency care or RTC including fever >101.51F, chest pain, shortness of breath, new/worsening/un-resolving symptoms, reviewed with patient at time of visit. Follow up and care instructions discussed and provided in AVS. - I discussed the assessment and treatment plan with the patient. The patient was provided an opportunity to ask questions and all were answered. The patient agreed with the plan and demonstrated an understanding of the instructions.  I provided 20+ minutes of non-face-to-face time during this encounter.  Delsa Grana, PA-C 05/09/20 9:34 AM

## 2020-05-09 NOTE — Telephone Encounter (Signed)
Patient called back to inquire if Dr Ancil Boozer could send an Rx to the Pharmacy for her because she is having some issues with her allergies. Please advise

## 2020-05-09 NOTE — Telephone Encounter (Signed)
Pt is scheduled for a virtual today with Kristeen Miss

## 2020-05-09 NOTE — Progress Notes (Deleted)
   Name: Jill Davidson   MRN: [id number]    DOB: 04/29/1971   Date:05/09/2020       Progress Note  Subjective:    Chief Complaint  Chief Complaint  Patient presents with  . Nasal Congestion    Symptoms started about a week ago, she had a covid test on 04/25/20 and it was negative  . Fatigue  . Cough    I connected with  [name]  on 05/09/20 at  9:00 AM EDT by a video enabled telemedicine application and verified that I am speaking with the correct person using two identifiers.  I discussed the limitations of evaluation and management by telemedicine and the availability of in person appointments. The patient expressed understanding and agreed to proceed. Staff also discussed with the patient that there may be a patient responsible charge related to this service. Patient Location:home Provider Location: *** Additional Individuals present: ***  HPI   Patient Active Problem List   Diagnosis Date Noted  . Tobacco user 07/24/2018  . 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 Vasomotor symptoms due to menopause 05/06/2018  . S/P laparoscopic assisted vaginal hysterectomy (LAVH) bilateral salpingectomy right oophorectomy 03/24/2018  . Obesity (BMI 30.0-34.9) 07/25/2016  . Essential hypertension 07/25/2016  . Status post endometrial ablation 07/23/2016  . IDA (iron deficiency anemia) 01/25/2015    Social History   Tobacco Use  . Smoking status: Current Every Day Smoker    Packs/day: 0.50    Years: 25.00    Pack years: 12.50    Types: Cigarettes    Start date: 07/03/1992  . Smokeless tobacco: Never Used  Substance Use Topics  . Alcohol use: Yes    Comment: every 2-3 weeks     Current Outpatient Medications:  .  acetaminophen (TYLENOL) 325 MG tablet, Take 650 mg by mouth every 6 (six) hours as needed., Disp: , Rfl:  .  losartan-hydrochlorothiazide (HYZAAR) 100-12.5 MG tablet, TAKE  1 TABLET BY MOUTH EVERY DAY, Disp: 60 tablet, Rfl: 0 .  fluticasone (FLONASE) 50 MCG/ACT nasal spray, Place 2 sprays into both nostrils daily. (Patient not taking: Reported on 05/09/2020), Disp: 16 g, Rfl: 6  No Known Allergies  ***  Review of Systems    Objective:   Virtual encounter, vitals limited, only able to obtain the following Today's Vitals   05/09/20 0832  Weight: 200 lb (90.7 kg)  Height: 5\' 3"  (1.6 m)   Body mass index is 35.43 kg/m. Nursing Note and Vital Signs reviewed.  Physical Exam  PE limited by telephone encounter  No results found for this or any previous visit (from the past 72 hour(s)).  Assessment and Plan:   No diagnosis found.   -Red flags and when to present for emergency care or RTC including fever >101.49F, chest pain, shortness of breath, new/worsening/un-resolving symptoms, reviewed with patient at time of visit. Follow up and care instructions discussed and provided in AVS. - I discussed the assessment and treatment plan with the patient. The patient was provided an opportunity to ask questions and all were answered. The patient agreed with the plan and demonstrated an understanding of the instructions.  I provided *** minutes of non-face-to-face time during this encounter.  Hollie Salk, [place] 05/09/20 9:33 AM

## 2020-05-09 NOTE — Patient Instructions (Signed)
Remain out of work until [place] test is back, your symptoms are improving and you have remained fever free for 24+ hours.  Use mucinex and delsym or robitussin, and other over the counter cold and cough meds.  For acute bronchitis  Prednisone in the morning for 5 days  Tessalon is a cough medicine you can use throughout the day as needed  Mucinex over the counter - it helps break up mucous in your chest.  You can get Mucinex or mucinex-D or -DM  Drink ample clear fluids  REST!  Take tylenol as needed for fever and/or aches  Should be feeling a little better in 3-5 days  Cough may linger for a few weeks or sometimes up to 6 weeks, which is normal!    Please return to be rechecked if you are feeling worse at any point, or if you do not feel improved much in the next 1-2 weeks.  Concerning new or worsening symptoms include chest pain, shortness of breath, fever, coughing up blood, night sweats, unintentional weight loss, confusion, decreased urine.

## 2020-05-10 DIAGNOSIS — Z20822 Contact with and (suspected) exposure to covid-19: Secondary | ICD-10-CM | POA: Diagnosis not present

## 2020-06-20 ENCOUNTER — Other Ambulatory Visit: Payer: Self-pay | Admitting: Family Medicine

## 2020-06-20 DIAGNOSIS — R220 Localized swelling, mass and lump, head: Secondary | ICD-10-CM

## 2020-06-23 ENCOUNTER — Ambulatory Visit
Admission: RE | Admit: 2020-06-23 | Discharge: 2020-06-23 | Disposition: A | Discharge status: Home / Self Care | Payer: Medicaid Other | Source: Ambulatory Visit | Attending: Family Medicine | Admitting: Family Medicine

## 2020-06-23 ENCOUNTER — Other Ambulatory Visit: Payer: Self-pay

## 2020-06-23 DIAGNOSIS — Z1231 Encounter for screening mammogram for malignant neoplasm of breast: Secondary | ICD-10-CM

## 2020-06-27 ENCOUNTER — Other Ambulatory Visit: Payer: Self-pay | Admitting: Family Medicine

## 2020-06-27 DIAGNOSIS — N6310 Unspecified lump in the right breast, unspecified quadrant: Secondary | ICD-10-CM

## 2020-06-27 DIAGNOSIS — R928 Other abnormal and inconclusive findings on diagnostic imaging of breast: Secondary | ICD-10-CM

## 2020-07-08 ENCOUNTER — Ambulatory Visit
Admission: RE | Admit: 2020-07-08 | Discharge: 2020-07-08 | Disposition: A | Discharge status: Home / Self Care | Payer: BC Managed Care – PPO | Source: Ambulatory Visit | Attending: Family Medicine | Admitting: Family Medicine

## 2020-07-08 ENCOUNTER — Other Ambulatory Visit: Payer: Self-pay

## 2020-07-08 DIAGNOSIS — R928 Other abnormal and inconclusive findings on diagnostic imaging of breast: Secondary | ICD-10-CM

## 2020-07-08 DIAGNOSIS — N6310 Unspecified lump in the right breast, unspecified quadrant: Secondary | ICD-10-CM | POA: Insufficient documentation

## 2020-07-08 DIAGNOSIS — N6313 Unspecified lump in the right breast, lower outer quadrant: Secondary | ICD-10-CM | POA: Diagnosis not present

## 2020-08-17 ENCOUNTER — Telehealth (INDEPENDENT_AMBULATORY_CARE_PROVIDER_SITE_OTHER): Payer: Medicaid Other | Admitting: Family Medicine

## 2020-08-17 ENCOUNTER — Encounter: Payer: Self-pay | Admitting: Family Medicine

## 2020-08-17 DIAGNOSIS — R809 Proteinuria, unspecified: Secondary | ICD-10-CM

## 2020-08-17 DIAGNOSIS — J302 Other seasonal allergic rhinitis: Secondary | ICD-10-CM

## 2020-08-17 DIAGNOSIS — Z20822 Contact with and (suspected) exposure to covid-19: Secondary | ICD-10-CM | POA: Diagnosis not present

## 2020-08-17 DIAGNOSIS — J069 Acute upper respiratory infection, unspecified: Secondary | ICD-10-CM

## 2020-08-17 DIAGNOSIS — I10 Essential (primary) hypertension: Secondary | ICD-10-CM

## 2020-08-17 MED ORDER — LOSARTAN POTASSIUM-HCTZ 100-12.5 MG PO TABS: 1.0000 | ORAL_TABLET | Freq: Every day | ORAL | 0 refills | Status: DC

## 2020-08-17 MED ORDER — FLUTICASONE PROPIONATE 50 MCG/ACT NA SUSP: 2.0000 | Freq: Every day | NASAL | 0 refills | Status: DC

## 2020-08-17 MED ORDER — BENZONATATE 100 MG PO CAPS: 100.0000 mg | ORAL_CAPSULE | Freq: Three times a day (TID) | ORAL | 0 refills | Status: DC | PRN

## 2020-08-17 NOTE — Progress Notes (Signed)
Name: Jill Davidson   MRN: [id number]    DOB: 1971/07/26   Date:08/17/2020       Progress Note  Subjective  Chief Complaint  Chief Complaint  Patient presents with  . Nasal Congestion    I connected with  Jill Davidson on 08/17/20 at 10:00 AM EST by telephone and verified that I am speaking with the correct person using two identifiers.  I discussed the limitations, risks, security and privacy concerns of performing an evaluation and management service by telephone and the availability of in person appointments. Staff also discussed with the patient that there may be a patient responsible charge related to this service. Patient agreed on having a virtual visit  Patient Location: at home  Provider Location: [facility] Additional Individuals present: she was alone   HPI  Viral illness: she states on Thursday she got exposed to her grandchildren that had cold symptoms. She states symptoms started with nasal congestion, rhinorrhea, ear pain, mild dry cough, no wheezing or sob today but initially had some wheezing , last night she had severe headache and missed work. She has noticed increase in bowel movements but not watery, small decrease in appetite , normal sense of taste and smell. She had both COVID-19 vaccines, but not the flu shot for this season yet.. She states no fever or chills. . She went to work on Sunday and Monday but missed work on Tuesday.   HTN: taking medication for bp medication, but last fill was June 2021, she states she has been compliant , having headaches for the past 2 months, no chest pain or palpitation. She has a history of microalbuminuria and was advised to return in 2-3 weeks for labs and in person follow up.   AR: she needs refill of Flonase, she uses it prn, having cold symptoms at this time  Patient Active Problem List   Diagnosis Date Noted  . Tobacco user 07/24/2018  . 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 Polymerase chain reaction DNA test positive for herpes  simplex virus type 1 (HSV-1) 06/05/2018  . Vasomotor symptoms due to menopause 05/06/2018  . S/P laparoscopic assisted vaginal hysterectomy (LAVH) bilateral salpingectomy right oophorectomy 03/24/2018  . Obesity (BMI 30.0-34.9) 07/25/2016  . Essential hypertension 07/25/2016  . Status post endometrial ablation 07/23/2016  . IDA (iron deficiency anemia) 01/25/2015    Past Surgical History:  Procedure Laterality Date  . CESAREAN SECTION     X2  . DILATION AND CURETTAGE OF UTERUS    . DILITATION & CURRETTAGE/HYSTROSCOPY WITH NOVASURE ABLATION N/A 07/23/2016   Procedure: DILATATION & CURETTAGE/HYSTEROSCOPY WITH NOVASURE ABLATION;  Surgeon: Brayton Mars, MD;  Location: [facility] ORS;  Service: Gynecology;  Laterality: N/A;  . LAPAROSCOPIC VAGINAL HYSTERECTOMY WITH SALPINGECTOMY Bilateral 03/24/2018   Procedure: LAPAROSCOPIC ASSISTED VAGINAL HYSTERECTOMY WITH BILATERAL SALPINGECTOMY;  Surgeon: Brayton Mars, MD;  Location: [facility] ORS;  Service: Gynecology;  Laterality: Bilateral;    Family History  Problem Relation Age of Onset  . Diabetes Father   . Hypertension Father   . Colon cancer Maternal Aunt   . Breast cancer Maternal Aunt        great  . Heart disease Maternal Aunt   . Diabetes Maternal Grandmother   . Hypertension Mother   . Thyroid disease Mother   . Lung cancer Mother   . Asthma Mother   . Hypertension Brother   . Ovarian cancer Neg Hx     Social History   Socioeconomic History  . Marital  status: Single    Spouse name: Not on file  . Number of children: Not on file  . Years of education: Not on file  . Highest education level: Not on file  Occupational History  . Not on file  Tobacco Use  . Smoking status: Current Every Day Smoker    Packs/day: 0.50    Years: 25.00    Pack years: 12.50    Types: Cigarettes    Start date: 07/03/1992  . Smokeless tobacco: Never Used  Vaping Use  . Vaping Use: Never used  Substance and Sexual Activity  . Alcohol  use: Yes    Comment: every 2-3 weeks  . Drug use: Yes    Types: Marijuana    Comment: once a week  . Sexual activity: Yes    Birth control/protection: None    Comment: Ablation  Other Topics Concern  . 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 Difficulty of Paying Living Expenses: Somewhat hard  Food Insecurity: No Food Insecurity  . Worried About Charity fundraiser in the Last Year: Never true  . Ran Out of Food in the Last Year: Never true  Transportation Needs: No Transportation Needs  . Lack of Transportation (Medical): No  . Lack of Transportation (Non-Medical): No  Physical Activity: Insufficiently Active  . Days of Exercise per Week: 1 day  . Minutes of Exercise per Session: 40 min  Stress: No Stress Concern Present  . Feeling of Stress : Not at all  Social Connections: Moderately Integrated  . Frequency of Communication with Friends and Family: More than three times a week  . Frequency of Social Gatherings with Friends and Family: More than three times a week  . Attends Religious Services: More than 4 times per year  . Active Member of Clubs or Organizations: Yes  . Attends Archivist Meetings: More than 4 times per year  . Marital Status: Never married  Intimate Partner Violence: Not At Risk  . Fear of Current or Ex-Partner: No  . Emotionally Abused: No  . Physically Abused: No  . Sexually Abused: No     Current Outpatient Medications:  .  acetaminophen (TYLENOL) 325 MG tablet, Take 650 mg by mouth every 6 (six) hours as needed., Disp: , Rfl:  .  losartan-hydrochlorothiazide (HYZAAR) 100-12.5 MG tablet, TAKE 1 TABLET BY MOUTH EVERY DAY, Disp: 60 tablet, Rfl: 0 .  benzonatate (TESSALON) 100 MG capsule, Take 1 capsule (100 mg total) by mouth 3  (three) times daily as needed for cough. (Patient not taking: Reported on 08/17/2020), Disp: 30 capsule, Rfl: 0 .  fluticasone (FLONASE) 50 MCG/ACT nasal spray, Place 2 sprays into both nostrils daily. (Patient not taking: Reported on 05/09/2020), Disp: 16 g, Rfl: 6  No Known Allergies  I personally reviewed active problem list, medication list, allergies, family history, social history, health maintenance, notes from last encounter with the patient/caregiver today.   ROS  Ten systems reviewed and is negative except as mentioned in HPI   Objective  Virtual encounter, vitals not obtained.  There is no height or weight on file to calculate BMI.  Physical Exam  Awake, alert and oriented  PHQ2/9: Depression screen [facility] 2/9 08/17/2020 05/09/2020 10/20/2019 09/10/2019 03/19/2019  Decreased Interest 2 0 0 0 0  Down, Depressed, Hopeless 0 0 0 0 0  PHQ - 2 Score 2 0 0 0 0  Altered sleeping 2 2 0 0 0  Tired, decreased energy 2 2 0 0 0  Change in appetite 0 0 0 0 0  Feeling bad or failure about yourself  0 0 0 0 0  Trouble concentrating 0 0 0 0 0  Moving slowly or fidgety/restless 0 0 0 0 0  Suicidal thoughts 0 0 0 0 0  PHQ-9 Score 6 4 0 0 0  Difficult doing work/chores - Not difficult at all - - -   PHQ-2/9 , she is feeling tired due to viral illness   Fall Risk: Fall Risk  08/17/2020 05/09/2020 10/20/2019 09/10/2019 03/19/2019  Falls in the past year? 0 0 0 0 0  Number falls in past yr: 0 0 0 0 0  Injury with Fall? 0 0 0 0 0     Assessment & Plan  1. Microalbuminuria  - losartan-hydrochlorothiazide (HYZAAR) 100-12.5 MG tablet; Take 1 tablet by mouth daily.  Dispense: 30 tablet; Refill: 0  2. Hypertension, benign  - losartan-hydrochlorothiazide (HYZAAR) 100-12.5 MG tablet; Take 1 tablet by mouth daily.  Dispense: 30 tablet; Refill: 0  3. Upper respiratory tract infection, unspecified type  - benzonatate (TESSALON) 100 MG capsule; Take 1 capsule (100 mg total) by mouth 3 (three)  times daily as needed for cough.  Dispense: 40 capsule; Refill: 0  4. Seasonal allergic rhinitis, unspecified trigger  - fluticasone (FLONASE) 50 MCG/ACT nasal spray; Place 2 sprays into both nostrils daily.  Dispense: 16 g; Refill: 0  I discussed the assessment and treatment plan with the patient. The patient was provided an opportunity to ask questions and all were answered. The patient agreed with the plan and demonstrated an understanding of the instructions.   The patient was advised to call back or seek an in-person evaluation if the symptoms worsen or if the condition fails to improve as anticipated.  I provided 25 minutes of non-face-to-face time during this encounter.  Loistine Chance, MD

## 2020-08-19 ENCOUNTER — Encounter: Payer: BC Managed Care – PPO | Admitting: Internal Medicine

## 2020-08-19 ENCOUNTER — Other Ambulatory Visit: Payer: Self-pay

## 2020-08-19 ENCOUNTER — Telehealth: Payer: Self-pay

## 2020-08-19 NOTE — Telephone Encounter (Signed)
Copied from [place] [id number]. Topic: General - Other >> Aug 19, 2020  8:30 AM Keene Breath wrote: Reason for CRM: Patient called to ask the nurse to call her regarding her covid test at [facility].  She stated that she does not know how to share the file with the office through My Chart and would like the doctor to have a copy of results.  Patient is also requesting a note for work because she is still not feeling well.  Please call patient to discuss further at [phone]

## 2020-08-19 NOTE — Progress Notes (Signed)
Informed by Crystal that the patient noted to her on the phone call prior to the planned visit that the patient did not feel like she needed to be seen today and the visit was canceled.

## 2020-09-02 NOTE — Progress Notes (Signed)
Name: Jill Davidson   MRN: [id number]    DOB: [birth year]   Date:09/05/2020       Progress Note  Subjective  Chief Complaint  Follow up   HPI   HTN: taking medication for bp medication over the past week, she states smoked a cigarette right before she walked in the office. No chest pain or palpitation, but has noticed headaches lately. She also has a history of microalbuminuria. We will check labs and urine micro   Headache: she states she is walking up with a frontal headache, and it spreads all over her head. Pain is dull - aching and sometimes throbbing. It is around 7/10 in intensity and happens 2-3 times a week, she has been taking Tylenol and headache resolves. She denies drinking caffeine. She works 3 rd shift, and the days she works she goes to bed around 5 pm and gets up around 11 pm. She has difficulty falling asleep   Morbid obesity: BMI above 40, discussed life style modification  Tobacco use: half pack daily, discussed importance of quitting smoking  Snoring : she has interrupted sleep, high bp and obesity, we will order a sleep study  Dysthymia: phq 9 positive, she is helping raise her granddaughter. States she does not like her job - she started back April 2021 - she was let go January 2020 but asked her back in 2021. She states no motivation, does not like getting out of the house. She denies crying spells or suicidal thoughts. Feels tired all the time.   Itchy ears: going on for years, daily, she scratches with her nails. 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) 06/05/2018   Vasomotor symptoms due to menopause 05/06/2018   S/P laparoscopic assisted vaginal hysterectomy (LAVH) bilateral salpingectomy right oophorectomy 03/24/2018   Obesity (BMI 30.0-34.9) 07/25/2016   Essential  hypertension 07/25/2016   Status post endometrial ablation 07/23/2016   IDA (iron deficiency anemia) 01/25/2015    Past Surgical History:  Procedure Laterality Date   CESAREAN SECTION     X2   DILATION AND CURETTAGE OF UTERUS     DILITATION & CURRETTAGE/HYSTROSCOPY WITH NOVASURE ABLATION N/A 07/23/2016   Procedure: DILATATION & CURETTAGE/HYSTEROSCOPY WITH NOVASURE ABLATION;  Surgeon: Brayton Mars, MD;  Location: [facility];  Service: Gynecology;  Laterality: N/A;   LAPAROSCOPIC VAGINAL HYSTERECTOMY WITH SALPINGECTOMY Bilateral 03/24/2018   Procedure: LAPAROSCOPIC ASSISTED VAGINAL HYSTERECTOMY WITH BILATERAL SALPINGECTOMY;  Surgeon: Brayton Mars, MD;  Location: [facility];  Service: Gynecology;  Laterality: Bilateral;    Family History  Problem Relation Age of Onset   Diabetes Father    Hypertension Father    Colon cancer Maternal Aunt    Breast cancer Maternal Aunt        great   Heart disease Maternal Aunt    Diabetes Maternal Grandmother    Hypertension Mother    Thyroid disease Mother    Lung cancer Mother    Asthma Mother    Hypertension Brother    Ovarian cancer Neg Hx     Social History   Tobacco Use   Smoking status: Current Every Day Smoker    Packs/day: 0.50    Years: 25.00    Pack years: 12.50    Types: Cigarettes    Start date: 07/03/1992   Smokeless tobacco:  Never Used  Substance Use Topics   Alcohol use: Yes    Comment: every 2-3 weeks     Current Outpatient Medications:    acetaminophen (TYLENOL) 325 MG tablet, Take 650 mg by mouth every 6 (six) hours as needed., Disp: , Rfl:    benzonatate (TESSALON) 100 MG capsule, Take 1 capsule (100 mg total) by mouth 3 (three) times daily as needed for cough., Disp: 40 capsule, Rfl: 0   fluticasone (FLONASE) 50 MCG/ACT nasal spray, Place 2 sprays into both nostrils daily., Disp: 16 g, Rfl: 0   losartan-hydrochlorothiazide (HYZAAR) 100-12.5 MG tablet, Take 1 tablet by mouth  daily., Disp: 30 tablet, Rfl: 0  No Known Allergies  I personally reviewed active problem list, medication list, allergies, family history, social history, health maintenance with the patient/caregiver today.   ROS  Constitutional: Negative for fever , positive for weight change.  Respiratory: Negative for cough and shortness of breath.   Cardiovascular: Negative for chest pain or palpitations.  Gastrointestinal: Negative for abdominal pain, no bowel changes.  Musculoskeletal: Negative for gait problem or joint swelling.  Skin: Negative for rash.  Neurological: Negative for dizziness , positive for  headache.  No other specific complaints in a complete review of systems (except as listed in HPI above).  Objective  Vitals:   09/05/20 1444  BP: (!) 148/80  Pulse: 98  Resp: 18  Temp: 98.3 F (36.8 C)  TempSrc: Oral  SpO2: 96%  Weight: 234 lb 12.8 oz (106.5 kg)  Height: 5\' 3"  (1.6 m)    Body mass index is 41.59 kg/m.  Physical Exam  Constitutional: Patient appears well-developed and well-nourished. Obese  No distress.  HEENT: head atraumatic, normocephalic, pupils equal and reactive to light, ears normal TM, ear canal showed mild erythema,  neck supple, throat within normal limits Cardiovascular: Normal rate, regular rhythm and normal heart sounds.  No murmur heard. No BLE edema. Pulmonary/Chest: Effort normal and breath sounds normal. No respiratory distress. Abdominal: Soft.  There is no tenderness. Psychiatric: Patient has a normal mood and affect. behavior is normal. Judgment and thought content normal.  PHQ2/9: Depression screen [facility] 2/9 09/05/2020 08/17/2020 05/09/2020 10/20/2019 09/10/2019  Decreased Interest 3 2 0 0 0  Down, Depressed, Hopeless 0 0 0 0 0  PHQ - 2 Score 3 2 0 0 0  Altered sleeping 0 2 2 0 0  Tired, decreased energy 3 2 2  0 0  Change in appetite 0 0 0 0 0  Feeling bad or failure about yourself  0 0 0 0 0  Trouble concentrating 0 0 0 0 0  Moving  slowly or fidgety/restless 0 0 0 0 0  Suicidal thoughts 0 0 0 0 0  PHQ-9 Score 6 6 4  0 0  Difficult doing work/chores Not difficult at all - Not difficult at all - -    phq 9 is positive  Fall Risk: Fall Risk  09/05/2020 08/17/2020 05/09/2020 10/20/2019 09/10/2019  Falls in the past year? 0 0 0 0 0  Number falls in past yr: 0 0 0 0 0  Injury with Fall? 0 0 0 0 0    Functional Status Survey: Is the patient deaf or have difficulty hearing?: Yes Does the patient have difficulty seeing, even when wearing glasses/contacts?: No Does the patient have difficulty concentrating, remembering, or making decisions?: No Does the patient have difficulty walking or climbing stairs?: No Does the patient have difficulty dressing or bathing?: No Does the patient have difficulty doing  errands alone such as visiting a doctor's office or shopping?: No   Assessment & Plan  1. Hypertension, benign  - CBC with Differential/Platelet - COMPLETE METABOLIC PANEL WITH GFR - Ambulatory referral to Sleep Studies  2. Microalbuminuria  - Microalbumin / creatinine urine ratio  3. Seasonal allergic rhinitis, unspecified trigger   4. History of iron deficiency anemia  - CBC with Differential/Platelet  5. Dysthymia  - TSH  6. Routine screening for STI (sexually transmitted infection)  - HIV Antibody (routine testing w rflx) - RPR  7. Screening for diabetes mellitus  - Hemoglobin A1c  8. Screening cholesterol level  - Lipid panel  9. Morbid obesity ([employer])  - Ambulatory referral to Sleep Studies - TSH  10. Frequent headaches   11. Snoring  - Ambulatory referral to Sleep Studies  12. Needs flu shot  Today   13. Ear itching  - acetic acid-hydrocortisone (VOSOL-HC) OTIC solution; Place 3 drops into both ears 2 (two) times daily.  Dispense: 10 mL; Refill: 0

## 2020-09-05 ENCOUNTER — Ambulatory Visit (INDEPENDENT_AMBULATORY_CARE_PROVIDER_SITE_OTHER): Payer: BC Managed Care – PPO | Admitting: Family Medicine

## 2020-09-05 ENCOUNTER — Encounter: Payer: Self-pay | Admitting: Family Medicine

## 2020-09-05 ENCOUNTER — Other Ambulatory Visit: Payer: Self-pay

## 2020-09-05 VITALS — BP 142/80 | HR 98 | Temp 98.3°F | Resp 18 | Ht 63.0 in | Wt 234.8 lb

## 2020-09-05 DIAGNOSIS — Z131 Encounter for screening for diabetes mellitus: Secondary | ICD-10-CM

## 2020-09-05 DIAGNOSIS — I10 Essential (primary) hypertension: Secondary | ICD-10-CM | POA: Diagnosis not present

## 2020-09-05 DIAGNOSIS — J302 Other seasonal allergic rhinitis: Secondary | ICD-10-CM | POA: Diagnosis not present

## 2020-09-05 DIAGNOSIS — R0683 Snoring: Secondary | ICD-10-CM

## 2020-09-05 DIAGNOSIS — Z113 Encounter for screening for infections with a predominantly sexual mode of transmission: Secondary | ICD-10-CM

## 2020-09-05 DIAGNOSIS — Z23 Encounter for immunization: Secondary | ICD-10-CM | POA: Diagnosis not present

## 2020-09-05 DIAGNOSIS — R809 Proteinuria, unspecified: Secondary | ICD-10-CM | POA: Diagnosis not present

## 2020-09-05 DIAGNOSIS — L299 Pruritus, unspecified: Secondary | ICD-10-CM

## 2020-09-05 DIAGNOSIS — Z862 Personal history of diseases of the blood and blood-forming organs and certain disorders involving the immune mechanism: Secondary | ICD-10-CM

## 2020-09-05 DIAGNOSIS — Z13220 Encounter for screening for lipoid disorders: Secondary | ICD-10-CM

## 2020-09-05 DIAGNOSIS — R519 Headache, unspecified: Secondary | ICD-10-CM

## 2020-09-05 DIAGNOSIS — F341 Dysthymic disorder: Secondary | ICD-10-CM

## 2020-09-05 MED ORDER — HYDROCORTISONE-ACETIC ACID 1-2 % OT SOLN: 3.0000 [drp] | Freq: Two times a day (BID) | OTIC | 0 refills | Status: DC

## 2020-09-05 MED ORDER — TRAZODONE HCL 50 MG PO TABS: 25.0000 mg | ORAL_TABLET | Freq: Every evening | ORAL | 10 refills | Status: DC | PRN

## 2020-09-06 ENCOUNTER — Encounter: Payer: Self-pay | Admitting: Family Medicine

## 2020-09-06 LAB — RPR: RPR Ser Ql: NONREACTIVE

## 2020-09-06 LAB — MICROALBUMIN / CREATININE URINE RATIO
Creatinine, Urine: 244 mg/dL (ref 20–275)
Microalb Creat Ratio: 12 mcg/mg creat (ref <30)
Microalb, Ur: 2.9 mg/dL

## 2020-09-06 LAB — HIV ANTIBODY (ROUTINE TESTING W REFLEX): HIV 1&2 Ab, 4th Generation: NONREACTIVE

## 2020-09-06 LAB — CBC WITH DIFFERENTIAL/PLATELET
Absolute Monocytes: 730 cells/uL (ref 200–950)
Basophils Absolute: 60 cells/uL (ref 0–200)
Basophils Relative: 0.4 %
Eosinophils Absolute: 149 cells/uL (ref 15–500)
Eosinophils Relative: 1 %
HCT: 40.6 % (ref 35.0–45.0)
Hemoglobin: 13.7 g/dL (ref 11.7–15.5)
Lymphs Abs: 6526 cells/uL — ABNORMAL HIGH (ref 850–3900)
MCH: 30.3 pg (ref 27.0–33.0)
MCHC: 33.7 g/dL (ref 32.0–36.0)
MCV: 89.8 fL (ref 80.0–100.0)
MPV: 10.2 fL (ref 7.5–12.5)
Monocytes Relative: 4.9 %
Neutro Abs: 7435 cells/uL (ref 1500–7800)
Neutrophils Relative %: 49.9 %
Platelets: 357 10*3/uL (ref 140–400)
RBC: 4.52 10*6/uL (ref 3.80–5.10)
RDW: 12.2 % (ref 11.0–15.0)
Total Lymphocyte: 43.8 %
WBC: 14.9 10*3/uL — ABNORMAL HIGH (ref 3.8–10.8)

## 2020-09-06 LAB — COMPLETE METABOLIC PANEL WITH GFR
AG Ratio: 1.5 (calc) (ref 1.0–2.5)
ALT: 9 U/L (ref 6–29)
AST: 14 U/L (ref 10–35)
Albumin: 4.1 g/dL (ref 3.6–5.1)
Alkaline phosphatase (APISO): 55 U/L (ref 31–125)
BUN: 14 mg/dL (ref 7–25)
CO2: 29 mmol/L (ref 20–32)
Calcium: 9.6 mg/dL (ref 8.6–10.2)
Chloride: 105 mmol/L (ref 98–110)
Creat: 0.67 mg/dL (ref 0.50–1.10)
GFR, Est African American: 121 mL/min/{1.73_m2} (ref >=60)
GFR, Est Non African American: 105 mL/min/{1.73_m2} (ref >=60)
Globulin: 2.7 g/dL (calc) (ref 1.9–3.7)
Glucose, Bld: 89 mg/dL (ref 65–99)
Potassium: 3.9 mmol/L (ref 3.5–5.3)
Sodium: 143 mmol/L (ref 135–146)
Total Bilirubin: 0.2 mg/dL (ref 0.2–1.2)
Total Protein: 6.8 g/dL (ref 6.1–8.1)

## 2020-09-06 LAB — TSH: TSH: 2.25 mIU/L

## 2020-09-06 LAB — HEMOGLOBIN A1C
Hgb A1c MFr Bld: 5.6 % of total Hgb (ref <5.7)
Mean Plasma Glucose: 114 mg/dL
eAG (mmol/L): 6.3 mmol/L

## 2020-09-06 LAB — LIPID PANEL
Cholesterol: 159 mg/dL (ref <200)
HDL: 39 mg/dL — ABNORMAL LOW (ref >=50)
LDL Cholesterol (Calc): 101 mg/dL (calc) — ABNORMAL HIGH
Non-HDL Cholesterol (Calc): 120 mg/dL (calc) (ref <130)
Total CHOL/HDL Ratio: 4.1 (calc) (ref <5.0)
Triglycerides: 94 mg/dL (ref <150)

## 2020-09-07 ENCOUNTER — Other Ambulatory Visit: Payer: Self-pay | Admitting: Family Medicine

## 2020-09-09 ENCOUNTER — Encounter: Payer: Self-pay | Admitting: Family Medicine

## 2020-09-27 ENCOUNTER — Telehealth: Payer: Self-pay | Admitting: Family Medicine

## 2020-09-27 ENCOUNTER — Encounter: Payer: Self-pay | Admitting: Family Medicine

## 2020-09-27 NOTE — Telephone Encounter (Signed)
Pt is going to get tested today and she is scheduled for a virtual tomorrow

## 2020-09-27 NOTE — Telephone Encounter (Signed)
Pt has been exposed to covid at work.  Pt has had a headache for 4 days.  Pt has diarrhea, and body aches. Pt sent a mychart message and was hoping to get a work note, as she was unable to work due to having severe headaches. Pt advised would need virtual visit. Pt is going to try to get tested.

## 2020-09-28 ENCOUNTER — Other Ambulatory Visit: Payer: BC Managed Care – PPO

## 2020-09-28 ENCOUNTER — Ambulatory Visit (INDEPENDENT_AMBULATORY_CARE_PROVIDER_SITE_OTHER): Payer: BC Managed Care – PPO | Admitting: Internal Medicine

## 2020-09-28 DIAGNOSIS — B349 Viral infection, unspecified: Secondary | ICD-10-CM

## 2020-09-28 DIAGNOSIS — F17200 Nicotine dependence, unspecified, uncomplicated: Secondary | ICD-10-CM

## 2020-09-28 DIAGNOSIS — Z20822 Contact with and (suspected) exposure to covid-19: Secondary | ICD-10-CM | POA: Diagnosis not present

## 2020-09-28 DIAGNOSIS — R519 Headache, unspecified: Secondary | ICD-10-CM | POA: Diagnosis not present

## 2020-09-28 DIAGNOSIS — M791 Myalgia, unspecified site: Secondary | ICD-10-CM | POA: Diagnosis not present

## 2020-09-28 DIAGNOSIS — R197 Diarrhea, unspecified: Secondary | ICD-10-CM

## 2020-09-28 NOTE — Progress Notes (Signed)
Name: Jill Davidson   MRN: [id number]    DOB: 12-Dec-1970   Date:09/28/2020       Progress Note  Subjective  Chief Complaint  Chief Complaint  Patient presents with  . Sore Throat    X3 days  . Headache    X5 days  . bodyaches  . Diarrhea    I connected with  [name] on 09/28/20 at  8:40 AM EST by telephone and verified that I am speaking with the correct person using two identifiers.  I discussed the limitations, risks, security and privacy concerns of performing an evaluation and management service by telephone and the availability of in person appointments. The patient expressed understanding and agreed to proceed. Staff also discussed with the patient that there may be a patient responsible charge related to this service. Patient Location: [place] Location: [facility] Additional Individuals present: none  HPI Patient is a 50 year old female patient of Dr. Ancil Boozer She has had messages with Dr. Ancil Boozer beginning 09/27/2020 She has a history of migraine headaches, and noted was battling a migraine headache since Saturday and then other symptoms arose including diarrhea, sore throat, and body aches. A phone visit was made with me this morning.  COVID vaccination status - had vaccine X 2, not have booster yet Covid test scheduled for tonight  Had employee who tested positive last Wed, had contacts and had exposures Her symptoms started Saturday with a headache, thought was a migraine, not as bad presently, mostly at night, and the other symptoms occurred a day or 2 later.  + cough, minimal  Production occasionally, yellowish phlegm, no blood No marked SOB, not at rest, when up and about No fever (not have thermometer), +feeling feverish with some chills/sweats + sore throat - a little bit, not when swallows + mild congestion, + PND and mucus clear often  No loss of smell, loss of taste No N/V (felt nausea yesterday after had 3 chicken wings) + muscle aches + marked loose  stools/diarrhea - Monday when worse, went about 5 times, no blood, better since, went this am and loose No left sided CP, passing out episodes  Has tried tylenol products, tries staying well hydrated No recent abx, no recent travel Tobacco- current everyday smoker, cut back since sick, had one this am Comorbid conditions reviewed No asthma/COPD hx,  No h/o DM,  + HTN + morbid obesity  Requested a note for work. Missed work since Monday due to her symptoms.    Patient Active Problem List   Diagnosis Date Noted  . Morbid obesity ([place]) 09/05/2020  . Tobacco user 07/24/2018  . 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 Vasomotor symptoms due to menopause 05/06/2018  . S/P laparoscopic assisted vaginal hysterectomy (LAVH) bilateral salpingectomy right oophorectomy 03/24/2018  . Essential hypertension 07/25/2016  . Status post endometrial ablation 07/23/2016  . IDA (iron deficiency anemia) 01/25/2015    Past Surgical History:  Procedure Laterality Date  . CESAREAN SECTION     X2  . DILATION AND CURETTAGE OF UTERUS    . DILITATION & CURRETTAGE/HYSTROSCOPY WITH NOVASURE ABLATION N/A 07/23/2016   Procedure: DILATATION & CURETTAGE/HYSTEROSCOPY WITH NOVASURE ABLATION;  Surgeon: Brayton Mars, MD;  Location: [facility];  Service: Gynecology;  Laterality: N/A;  . LAPAROSCOPIC VAGINAL HYSTERECTOMY WITH SALPINGECTOMY Bilateral 03/24/2018   Procedure: LAPAROSCOPIC ASSISTED VAGINAL HYSTERECTOMY WITH BILATERAL SALPINGECTOMY;  Surgeon: Brayton Mars, MD;  Location: [facility];  Service: Gynecology;  Laterality: Bilateral;    Family History  Problem Relation Age of Onset  . Diabetes Father   . Hypertension Father   . Colon cancer Maternal Aunt   . Breast cancer Maternal Aunt        great  . Heart disease Maternal Aunt   . Diabetes Maternal Grandmother   . Hypertension Mother   . Thyroid  disease Mother   . Lung cancer Mother   . Asthma Mother   . Hypertension Brother   . Ovarian cancer Neg Hx     Social History   Tobacco Use  . Smoking status: Current Every Day Smoker    Packs/day: 0.50    Years: 25.00    Pack years: 12.50    Types: Cigarettes    Start date: 07/03/1992  . Smokeless tobacco: Never Used  Substance Use Topics  . Alcohol use: Yes    Comment: every 2-3 weeks     Current Outpatient Medications:  .  acetaminophen (TYLENOL) 325 MG tablet, Take 650 mg by mouth every 6 (six) hours as needed., Disp: , Rfl:  .  acetic acid-hydrocortisone (VOSOL-HC) OTIC solution, Place 3 drops into both ears 2 (two) times daily., Disp: 10 mL, Rfl: 0 .  fluticasone (FLONASE) 50 MCG/ACT nasal spray, Place 2 sprays into both nostrils daily., Disp: 16 g, Rfl: 0 .  losartan-hydrochlorothiazide (HYZAAR) 100-12.5 MG tablet, Take 1 tablet by mouth daily., Disp: 30 tablet, Rfl: 0 .  traZODone (DESYREL) 50 MG tablet, Take 0.5-1 tablets (25-50 mg total) by mouth at bedtime as needed for sleep., Disp: 30 tablet, Rfl: 10  No Known Allergies  With staff assistance, above reviewed with the patient today.  ROS: As per HPI, otherwise no specific complaints on a limited and focused system review   Objective  Virtual encounter, vitals not obtained.  There is no height or weight on file to calculate BMI.  Physical Exam   Appears in NAD via conversation, no major coughing episodes during our conversation, very pleasant Pulmonary/Chest: No obvious respiratory distress. Speaking in complete sentences Neurological: Pt is alert, Speech is normal Psychiatric: Patient has a normal mood and affect, behavior is normal. Judgment and thought content normal.   No results found for this or any previous visit (from the past 72 hour(s)).  PHQ2/9: Depression screen [facility] 2/9 09/28/2020 09/05/2020 08/17/2020 05/09/2020 10/20/2019  Decreased Interest 1 3 2  0 0  Down, Depressed, Hopeless 0 0 0 0 0  PHQ  - 2 Score 1 3 2  0 0  Altered sleeping 0 0 2 2 0  Tired, decreased energy 1 3 2 2  0  Change in appetite 1 0 0 0 0  Feeling bad or failure about yourself  0 0 0 0 0  Trouble concentrating 0 0 0 0 0  Moving slowly or fidgety/restless 0 0 0 0 0  Suicidal thoughts 0 0 0 0 0  PHQ-9 Score 3 6 6 4  0  Difficult doing work/chores - Not difficult at all - Not difficult at all -  Some recent data might be hidden   PHQ-2/9 Result reviewed  Fall Risk: Fall Risk  09/28/2020 09/05/2020 08/17/2020 05/09/2020 10/20/2019  Falls in the past year? 0 0 0 0 0  Number falls in past yr: 0 0 0 0 0  Injury with Fall? 0 0 0 0 0     Assessment & Plan 1. Nonintractable headache, unspecified chronicity pattern, unspecified headache type 2. Diarrhea, unspecified type  3. Myalgia 4. Viral syndrome Patient with a constellation of symptoms as noted above, seemingly most consistent with a viral syndrome.  She notes her headaches are better during the day, can be a little more problematic at nighttime, and has been using Tylenol type products to help presently.  Not like her classic migraine type headaches.  Her loose bowel movements/diarrhea has settled in the recent past.  He has been trying to stay well-hydrated. Agree with getting a COVID test, and she has that scheduled for tonight.  Noted this possibility given the pandemic and increased cases recently.  Also discussed the possibility of a viral illness, not COVID. Felt best to proceed as follows: Get the COVID test tonight as planned, if positive, she will let us know and did note some treatment options that may be available (have limited availability presently) if started within the first 10 days. Continue symptomatic measures, with staying well-hydrated emphasized, Pepto-Bismol type product as needed in the short-term recommended (she noted she has some of this at home), also a Mucinex type product to help as an expectorant, and continuing the Tylenol products for the  achiness and headaches. Also rest important. Await the test results, and her symptom response over the next few days before providing a note for work, as will be helpful to note when she will be able to return to work.  She should remain out of work as she still is potentially contagious, even if it is not COVID.  Emphasized if her symptoms are not improving or more problematic over time, the importance of following up and if her symptoms become more acutely worsening, with increasing shortness of breath, chest pains, higher fevers, she needs to be seen more emergently in an ER setting.  She was understanding of that.  5. Tobacco dependence Encouraged to try to lessen tobacco use both when ill, and in the future.   She will contact us when the test result returns, and asked that she talk to [place], and will assess how she is doing at that time to help with any documentation needed for her work, and best return to work plans.  I discussed the assessment and treatment plan with the patient. The patient was provided an opportunity to ask questions and all were answered. The patient agreed with the plan and demonstrated an understanding of the instructions.  Red flags and when to present for emergency care or RTC including fevers, chest pain, shortness of breath, new/worsening/un-resolving symptoms reviewed with patient at time of visit.   The patient was advised to call back or seek an in-person evaluation if the symptoms worsen or if the condition fails to improve as anticipated.  I provided 20 minutes of non-face-to-face time during this encounter that included discussing at length patient's sx/history, pertinent pmhx, medications, treatment and follow up plan. This time also included the necessary documentation, orders, and chart review.  Towanda Malkin, MD

## 2020-09-30 ENCOUNTER — Telehealth (INDEPENDENT_AMBULATORY_CARE_PROVIDER_SITE_OTHER): Payer: BC Managed Care – PPO | Admitting: Family Medicine

## 2020-09-30 ENCOUNTER — Other Ambulatory Visit: Payer: Self-pay

## 2020-09-30 ENCOUNTER — Encounter: Payer: Self-pay | Admitting: Family Medicine

## 2020-09-30 ENCOUNTER — Telehealth: Payer: Self-pay

## 2020-09-30 DIAGNOSIS — G43001 Migraine without aura, not intractable, with status migrainosus: Secondary | ICD-10-CM | POA: Diagnosis not present

## 2020-09-30 DIAGNOSIS — Z20822 Contact with and (suspected) exposure to covid-19: Secondary | ICD-10-CM | POA: Diagnosis not present

## 2020-09-30 LAB — NOVEL CORONAVIRUS, NAA: SARS-CoV-2, NAA: NOT DETECTED

## 2020-09-30 LAB — SARS-COV-2, NAA 2 DAY TAT

## 2020-09-30 MED ORDER — UBRELVY 100 MG PO TABS: 1.0000 | ORAL_TABLET | Freq: Every day | ORAL | 0 refills | Status: DC | PRN

## 2020-09-30 MED ORDER — ONDANSETRON HCL 4 MG PO TABS: 4.0000 mg | ORAL_TABLET | Freq: Three times a day (TID) | ORAL | 0 refills | Status: DC | PRN

## 2020-09-30 MED ORDER — SUMATRIPTAN SUCCINATE 100 MG PO TABS: 100.0000 mg | ORAL_TABLET | ORAL | 0 refills | Status: DC | PRN

## 2020-09-30 NOTE — Telephone Encounter (Signed)
Pt wanted to know if you have written her return to work note. States that she would like to return to work on Tuesday pending test results.

## 2020-09-30 NOTE — Progress Notes (Signed)
Name: Jill Davidson   MRN: [id number]    DOB: 09/18/1970   Date:09/30/2020       Progress Note  Subjective  Chief Complaint  Chief Complaint  Patient presents with  . Covid Exposure  . Fatigue  . Diarrhea  . Nausea  . Chills    I connected with  [name]  on 09/30/20 at  2:40 PM EST by a video enabled telemedicine application and verified that I am speaking with the correct person using two identifiers.  I discussed the limitations of evaluation and management by telemedicine and the availability of in person appointments. The patient expressed understanding and agreed to proceed with the virtual visit  Staff also discussed with the patient that there may be a patient responsible charge related to this service. Patient Location:  Provider Location: [facility] [place] Additional Individuals present: none   HPI  COVID-19 symptoms: exposed at work on 09/21/2020, three days later she developed headache, chills, nausea, diarrhea. Mild rhinorrhea and dry cough that improved with cough medication she had at home. Normal sense of taste and smell. COVId-19 test was negative but she still has lack of appetite and mild nausea , she has muscle aches and fatigue. She continues to have throbbing headache on and off. Tylenol helps with symptoms a little   Migraine headaches: she has never diagnosed with migraines, but recalls having recurrent headaches described as throbbing and it can last days. Pain is intense 8/10, associated with light and sound sensitivity. Associated with nausea but no vomiting, no warning. She states her mother also had migraines.   Patient Active Problem List   Diagnosis Date Noted  . Morbid obesity ([place]) 09/05/2020  . Tobacco user 07/24/2018  . 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 Vasomotor symptoms due to menopause 05/06/2018  . S/P laparoscopic assisted vaginal hysterectomy (LAVH)  bilateral salpingectomy right oophorectomy 03/24/2018  . Essential hypertension 07/25/2016  . Status post endometrial ablation 07/23/2016  . IDA (iron deficiency anemia) 01/25/2015    Past Surgical History:  Procedure Laterality Date  . CESAREAN SECTION     X2  . DILATION AND CURETTAGE OF UTERUS    . DILITATION & CURRETTAGE/HYSTROSCOPY WITH NOVASURE ABLATION N/A 07/23/2016   Procedure: DILATATION & CURETTAGE/HYSTEROSCOPY WITH NOVASURE ABLATION;  Surgeon: Brayton Mars, MD;  Location: [facility] ORS;  Service: Gynecology;  Laterality: N/A;  . LAPAROSCOPIC VAGINAL HYSTERECTOMY WITH SALPINGECTOMY Bilateral 03/24/2018   Procedure: LAPAROSCOPIC ASSISTED VAGINAL HYSTERECTOMY WITH BILATERAL SALPINGECTOMY;  Surgeon: Brayton Mars, MD;  Location: [facility] ORS;  Service: Gynecology;  Laterality: Bilateral;    Family History  Problem Relation Age of Onset  . Diabetes Father   . Hypertension Father   . Colon cancer Maternal Aunt   . Breast cancer Maternal Aunt        great  . Heart disease Maternal Aunt   . Diabetes Maternal Grandmother   . Hypertension Mother   . Thyroid disease Mother   . Lung cancer Mother   . Asthma Mother   . Hypertension Brother   . Ovarian cancer Neg Hx     Social History   Socioeconomic History  . Marital status: Single    Spouse name: Not on file  . Number of children: Not on file  . Years of education: Not on file  . Highest education level: Not on file  Occupational History  . Not on file  Tobacco  Use  . Smoking status: Current Every Day Smoker    Packs/day: 0.50    Years: 25.00    Pack years: 12.50    Types: Cigarettes    Start date: 07/03/1992  . Smokeless tobacco: Never Used  Vaping Use  . Vaping Use: Never used  Substance and Sexual Activity  . Alcohol use: Yes    Comment: every 2-3 weeks  . Drug use: Yes    Types: Marijuana    Comment: once a week  . Sexual activity: Yes    Birth control/protection: None    Comment: Ablation  Other  Topics Concern  . 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  acetaminophen (TYLENOL) 325 MG tablet, Take 650 mg by mouth every 6 (six) hours as needed., Disp: , Rfl:  .  acetic acid-hydrocortisone (VOSOL-HC) OTIC solution, Place 3 drops into both ears 2 (two) times daily., Disp: 10 mL, Rfl: 0 .  fluticasone (FLONASE) 50 MCG/ACT nasal spray, Place 2 sprays into both nostrils daily., Disp: 16 g, Rfl: 0 .  losartan-hydrochlorothiazide (HYZAAR) 100-12.5 MG tablet, Take 1 tablet by mouth daily., Disp: 30 tablet, Rfl: 0 .  traZODone (DESYREL) 50 MG tablet, Take 0.5-1 tablets (25-50 mg total) by mouth at bedtime as needed for sleep., Disp: 30 tablet, Rfl: 10  No Known Allergies  I personally reviewed active problem list, medication list, allergies, family history, social history with the patient/caregiver today.   ROS  Ten systems reviewed and is negative except as mentioned in HPI   Objective  Virtual encounter, vitals not obtained.  There is no height or weight on file to calculate BMI.  Physical Exam  Awake, alert and oriented   Results for orders placed or performed in visit on 09/28/20 (from the past 72 hour(s))  Novel Coronavirus, NAA ([facility])     Status: None   Collection Time: 09/28/20  6:34 PM   Specimen: Nasopharyngeal(NP) swabs in vial transport medium   Nasopharynge  Result Value Ref Range   SARS-CoV-2, NAA Not Detected Not Detected    Comment: This nucleic acid amplification test was developed and its performance characteristics  determined by [employer]. Nucleic acid amplification tests include RT-PCR and TMA. This test has not been FDA cleared or approved. This test has been authorized by FDA under an Emergency Use Authorization (EUA). This test is only authorized for the duration of time the declaration that circumstances exist justifying the authorization of the emergency use of in vitro diagnostic tests for detection of SARS-CoV-2 virus and/or diagnosis of COVID-19 infection under section 564(b)(1) of the Act, 21 U.S.C. 431VQM-0(Q) (1), unless the authorization is terminated or revoked sooner. When diagnostic testing is negative, the possibility of a false negative result should be considered in the context of a patient's recent exposures and the presence of clinical signs and symptoms consistent with COVID-19. An individual without symptoms of COVID-19 and who is not shedding SARS-CoV-2 virus wo uld expect to have a negative (not detected)  result in this assay.   SARS-COV-2, NAA 2 DAY TAT     Status: None   Collection Time: 09/28/20  6:34 PM   Nasopharynge  Result Value Ref Range   SARS-CoV-2, NAA 2 DAY TAT Performed     PHQ2/9: Depression screen [facility] 2/9 09/30/2020 09/28/2020 09/05/2020 08/17/2020 05/09/2020  Decreased Interest 0 1 3 2  0  Down, Depressed, Hopeless 0 0 0 0 0  PHQ - 2 Score 0 1 3 2  0  Altered sleeping 2 0 0 2 2  Tired, decreased energy 2 1 3 2 2   Change in appetite 0 1 0 0 0  Feeling bad or failure about yourself  0 0 0 0 0  Trouble concentrating 0 0 0 0 0  Moving slowly or fidgety/restless 0 0 0 0 0  Suicidal thoughts 0 0 0 0 0  PHQ-9 Score 4 3 6 6 4   Difficult doing work/chores Not difficult at all - Not difficult at all - Not difficult at all  Some recent data might be hidden   PHQ-2/9 Result is negative.    Fall Risk: Fall Risk  09/30/2020 09/28/2020 09/05/2020 08/17/2020 05/09/2020  Falls in the past year? 0 0 0 0 0  Number falls in past yr: 0 0 0 0 0  Injury with  Fall? 0 0 0 0 0     Assessment & Plan  1. Suspected COVID-19 virus infection  - ondansetron (ZOFRAN) 4 MG tablet; Take 1 tablet (4 mg total) by mouth every 8 (eight) hours as needed for nausea or vomiting.  Dispense: 20 tablet; Refill: 0 - POCT Influenza A/B - Novel Coronavirus, NAA ([facility])  2. Migraine without aura and with status migrainosus, not intractable  - Ubrogepant (UBRELVY) 100 MG TABS; Take 1 tablet by mouth daily as needed.  Dispense: 10 tablet; Refill: 0 - SUMAtriptan (IMITREX) 100 MG tablet; Take 1 tablet (100 mg total) by mouth every 2 (two) hours as needed for migraine. May repeat in 2 hours if headache persists or recurs.  Dispense: 10 tablet; Refill: 0  I discussed the assessment and treatment plan with the patient. The patient was provided an opportunity to ask questions and all were answered. The patient agreed with the plan and demonstrated an understanding of the instructions.  The patient was advised to call back or seek an in-person evaluation if the symptoms worsen or if the condition fails to improve as anticipated.  I provided 15 minutes of non-face-to-face time during this encounter.

## 2020-09-30 NOTE — Patient Instructions (Signed)
Sumatriptan causes more side effects, try Roselyn Meier first if covered by insurance

## 2020-10-03 LAB — NOVEL CORONAVIRUS, NAA: SARS-CoV-2, NAA: NOT DETECTED

## 2020-10-03 LAB — SPECIMEN STATUS REPORT

## 2020-10-26 ENCOUNTER — Other Ambulatory Visit: Payer: Self-pay | Admitting: Family Medicine

## 2020-10-26 DIAGNOSIS — R809 Proteinuria, unspecified: Secondary | ICD-10-CM

## 2020-10-26 DIAGNOSIS — I10 Essential (primary) hypertension: Secondary | ICD-10-CM

## 2020-11-21 ENCOUNTER — Encounter: Payer: Medicaid Other | Admitting: Family Medicine

## 2020-11-24 ENCOUNTER — Other Ambulatory Visit: Payer: Self-pay

## 2020-12-07 ENCOUNTER — Other Ambulatory Visit: Payer: Self-pay | Admitting: Family Medicine

## 2020-12-07 DIAGNOSIS — I10 Essential (primary) hypertension: Secondary | ICD-10-CM

## 2020-12-07 DIAGNOSIS — R809 Proteinuria, unspecified: Secondary | ICD-10-CM

## 2021-01-04 ENCOUNTER — Encounter: Payer: Self-pay | Admitting: Family Medicine

## 2021-01-05 ENCOUNTER — Other Ambulatory Visit: Payer: Self-pay | Admitting: Family Medicine

## 2021-01-05 DIAGNOSIS — I10 Essential (primary) hypertension: Secondary | ICD-10-CM

## 2021-01-05 DIAGNOSIS — R0683 Snoring: Secondary | ICD-10-CM

## 2021-03-09 ENCOUNTER — Telehealth: Payer: Self-pay

## 2021-03-09 NOTE — Telephone Encounter (Signed)
Spoke with patient and she wanted to know if she had a TB skin test last year.  I informed patient that she did not and that we do not do TB skin test and that our providers prefer to do the Quantiferon Gold blood test to test for tuberculosis. Patient wanted to know if her job would take that and I informed patient she would have to ask them.  Patient also wanted to know when her last tetanus vaccine was and I informed patient that it was given on 09/10/2019 and she would be due for another on 09/09/2029.  Patient verbalized understanding.

## 2021-03-09 NOTE — Telephone Encounter (Signed)
Copied from [place] [id number]. Topic: General - Other >> Mar 09, 2021  3:32 PM Tessa Lerner A wrote: Reason for CRM: Patient would like to be contacted about records of immunizations  Please contact to further advise

## 2021-06-01 ENCOUNTER — Other Ambulatory Visit: Payer: Self-pay | Admitting: Family Medicine

## 2021-06-01 DIAGNOSIS — R809 Proteinuria, unspecified: Secondary | ICD-10-CM

## 2021-06-01 DIAGNOSIS — I10 Essential (primary) hypertension: Secondary | ICD-10-CM

## 2021-06-01 NOTE — Telephone Encounter (Signed)
Pt states she has no insurance at the moment and cannot schedule right now

## 2021-06-01 NOTE — Telephone Encounter (Signed)
Requested medication (s) are due for refill today: Yes  Requested medication (s) are on the active medication list: Yes  Last refill:  12/07/20  Future visit scheduled: No  Notes to clinic:  Pt. Reports she does not have insurance at present and cannot come in for appointment. 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 Requests prescription be refilled.    Requested Prescriptions  Pending Prescriptions Disp Refills   losartan-hydrochlorothiazide (HYZAAR) 100-12.5 MG tablet [Pharmacy Med Name: losartan 100 mg-hydrochlorothiazide 12.5 mg tablet] 90 tablet 0    Sig: TAKE 1 TABLET BY MOUTH EVERY DAY     Cardiovascular: ARB + Diuretic Combos Failed - 06/01/2021  7:44 AM      Failed - K in normal range and within 180 days    Potassium  Date Value Ref Range Status  09/05/2020 3.9 3.5 - 5.3 mmol/L Final  08/02/2017 3.6 3.5 - 5.1 mEq/L Final          Failed - Na in normal range and within 180 days    Sodium  Date Value Ref Range Status  09/05/2020 143 135 - 146 mmol/L Final  08/02/2017 137 136 - 145 mEq/L Final          Failed - Cr in normal range and within 180 days    Creatinine  Date Value Ref Range Status  08/02/2017 0.7 0.6 - 1.1 mg/dL Final   Creat  Date Value Ref Range Status  09/05/2020 0.67 0.50 - 1.10 mg/dL Final   Creatinine, Urine  Date Value Ref Range Status  09/05/2020 244 20 - 275 mg/dL Final          Failed - Ca in normal range and within 180 days    Calcium  Date Value Ref Range Status  09/05/2020 9.6 8.6 - 10.2 mg/dL Final  08/02/2017 8.9 8.4 - 10.4 mg/dL Final          Failed - Last BP in normal range    BP Readings from Last 1 Encounters:  09/05/20 (!) 142/80          Failed - Valid encounter within last 6 months    Recent Outpatient Visits           8 months ago Suspected COVID-19 virus infection   [facility] [place], Drue Stager, MD   8 months ago Nonintractable headache, unspecified chronicity pattern, unspecified  headache type   [facility] Towanda Malkin, MD   8 months ago Hypertension, benign   [facility] Steele Sizer, MD   9 months ago Upper respiratory tract infection, unspecified type   [facility] Steele Sizer, MD   1 year ago Acute bronchitis, unspecified organism   [facility] Delsa Grana, [place]              Passed - Patient is not pregnant

## 2021-06-02 NOTE — Telephone Encounter (Signed)
Pt informed of the refill and to consider [facility] if she is not able to sch a future appt

## 2021-06-19 ENCOUNTER — Telehealth: Payer: Self-pay

## 2021-06-19 NOTE — Telephone Encounter (Signed)
Copied from [place] [id number]. Topic: General - Other >> Jun 19, 2021  1:21 PM Celene Kras wrote: Reason for CRM: Pt called and is requesting to have a printed copy of her immunization records to pick up. Please advise.

## 2021-06-20 NOTE — Telephone Encounter (Signed)
LVM informing patient that requested record is available for pick up at our front desk.

## 2021-06-26 ENCOUNTER — Encounter: Payer: Self-pay | Admitting: Hematology and Oncology

## 2021-07-17 ENCOUNTER — Encounter: Payer: Self-pay | Admitting: Hematology and Oncology

## 2021-07-21 NOTE — Progress Notes (Deleted)
Name: Jill Davidson   MRN: [id number]    DOB: [birth year]   Date:07/21/2021       Progress Note  Subjective  Chief Complaint  Medication Refill  HPI  HTN: taking medication for bp medication over the past week, she states smoked a cigarette right before she walked in the office. No chest pain or palpitation, but has noticed headaches lately. She also has a history of microalbuminuria. We will check labs and urine micro   Headache: she states she is walking up with a frontal headache, and it spreads all over her head. Pain is dull - aching and sometimes throbbing. It is around 7/10 in intensity and happens 2-3 times a week, she has been taking Tylenol and headache resolves. She denies drinking caffeine. She works 3 rd shift, and the days she works she goes to bed around 5 pm and gets up around 11 pm. She has difficulty falling asleep   Morbid obesity: BMI above 40, discussed life style modification  Tobacco use: half pack daily, discussed importance of quitting smoking  Snoring : she has interrupted sleep, high bp and obesity, we will order a sleep study  Dysthymia: phq 9 positive, she is helping raise her granddaughter. States she does not like her job - she started back April 2021 - she was let go January 2020 but asked her back in 2021. She states no motivation, does not like getting out of the house. She denies crying spells or suicidal thoughts. Feels tired all the time.   Itchy ears: going on for years, daily, she scratches with her nails. 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) 06/05/2018   Vasomotor symptoms due to menopause 05/06/2018   S/P laparoscopic assisted vaginal hysterectomy (LAVH) bilateral salpingectomy right oophorectomy 03/24/2018   Essential hypertension  07/25/2016   Status post endometrial ablation 07/23/2016   IDA (iron deficiency anemia) 01/25/2015    Past Surgical History:  Procedure Laterality Date   CESAREAN SECTION     X2   DILATION AND CURETTAGE OF UTERUS     DILITATION & CURRETTAGE/HYSTROSCOPY WITH NOVASURE ABLATION N/A 07/23/2016   Procedure: DILATATION & CURETTAGE/HYSTEROSCOPY WITH NOVASURE ABLATION;  Surgeon: Brayton Mars, MD;  Location: [facility] ORS;  Service: Gynecology;  Laterality: N/A;   LAPAROSCOPIC VAGINAL HYSTERECTOMY WITH SALPINGECTOMY Bilateral 03/24/2018   Procedure: LAPAROSCOPIC ASSISTED VAGINAL HYSTERECTOMY WITH BILATERAL SALPINGECTOMY;  Surgeon: Brayton Mars, MD;  Location: [facility] OR[facility];  Service: Gynecology;  Laterality: Bilateral;    Family History  Problem Relation Age of Onset   Diabetes Father    Hypertension Father    Colon cancer Maternal Aunt    Breast cancer Maternal Aunt        great   Heart disease Maternal Aunt    Diabetes Maternal Grandmother    Hypertension Mother    Thyroid disease Mother    Lung cancer Mother    Asthma Mother    Hypertension Brother    Ovarian cancer Neg Hx     Social History   Tobacco Use   Smoking status: Every Day    Packs/day: 0.50    Years: 25.00    Pack years: 12.50    Types: Cigarettes    Start date: 07/03/1992   Smokeless tobacco: Never  Substance Use  Topics   Alcohol use: Yes    Comment: every 2-3 weeks     Current Outpatient Medications:    acetaminophen (TYLENOL) 325 MG tablet, Take 650 mg by mouth every 6 (six) hours as needed., Disp: , Rfl:    acetic acid-hydrocortisone (VOSOL-HC) OTIC solution, Place 3 drops into both ears 2 (two) times daily., Disp: 10 mL, Rfl: 0   fluticasone (FLONASE) 50 MCG/ACT nasal spray, Place 2 sprays into both nostrils daily., Disp: 16 g, Rfl: 0   losartan-hydrochlorothiazide (HYZAAR) 100-12.5 MG tablet, TAKE 1 TABLET BY MOUTH EVERY DAY, Disp: 30 tablet, Rfl: 0   ondansetron (ZOFRAN) 4 MG tablet, Take 1 tablet  (4 mg total) by mouth every 8 (eight) hours as needed for nausea or vomiting., Disp: 20 tablet, Rfl: 0   SUMAtriptan (IMITREX) 100 MG tablet, Take 1 tablet (100 mg total) by mouth every 2 (two) hours as needed for migraine. May repeat in 2 hours if headache persists or recurs., Disp: 10 tablet, Rfl: 0   traZODone (DESYREL) 50 MG tablet, Take 0.5-1 tablets (25-50 mg total) by mouth at bedtime as needed for sleep., Disp: 30 tablet, Rfl: 10   Ubrogepant (UBRELVY) 100 MG TABS, Take 1 tablet by mouth daily as needed., Disp: 10 tablet, Rfl: 0  No Known Allergies  I personally reviewed active problem list, medication list, allergies, family history, social history, health maintenance with the patient/caregiver today.   ROS  ***  Objective  There were no vitals filed for this visit.  There is no height or weight on file to calculate BMI.  Physical Exam ***  No results found for this or any previous visit (from the past 2160 hour(s)).    PHQ2/9: Depression screen [facility] 2/9 09/30/2020 09/28/2020 09/05/2020 08/17/2020 05/09/2020  Decreased Interest 0 1 3 2  0  Down, Depressed, Hopeless 0 0 0 0 0  PHQ - 2 Score 0 1 3 2  0  Altered sleeping 2 0 0 2 2  Tired, decreased energy 2 1 3 2 2   Change in appetite 0 1 0 0 0  Feeling bad or failure about yourself  0 0 0 0 0  Trouble concentrating 0 0 0 0 0  Moving slowly or fidgety/restless 0 0 0 0 0  Suicidal thoughts 0 0 0 0 0  PHQ-9 Score 4 3 6 6 4   Difficult doing work/chores Not difficult at all - Not difficult at all - Not difficult at all  Some recent data might be hidden    phq 9 is {gen pos [id number]}   Fall Risk: Fall Risk  09/30/2020 09/28/2020 09/05/2020 08/17/2020 05/09/2020  Falls in the past year? 0 0 0 0 0  Number falls in past yr: 0 0 0 0 0  Injury with Fall? 0 0 0 0 0      Functional Status Survey:      Assessment & Plan  *** There are no diagnoses linked to this encounter.

## 2021-07-24 ENCOUNTER — Ambulatory Visit: Payer: Medicaid Other | Admitting: Family Medicine

## 2021-09-05 ENCOUNTER — Other Ambulatory Visit: Payer: Self-pay | Admitting: Family Medicine

## 2021-09-05 DIAGNOSIS — I10 Essential (primary) hypertension: Secondary | ICD-10-CM

## 2021-09-05 DIAGNOSIS — R809 Proteinuria, unspecified: Secondary | ICD-10-CM

## 2021-09-05 NOTE — Telephone Encounter (Signed)
Requested medications are due for refill today.  yes  Requested medications are on the active medications list.  yes  Last refill. 06/02/2021  Future visit scheduled.   yes  Notes to clinic.  Courtesy refill already given. Pt is more than 3 months overdue for an office visit.    Requested Prescriptions  Pending Prescriptions Disp Refills   losartan-hydrochlorothiazide (HYZAAR) 100-12.5 MG tablet 30 tablet 0    Sig: Take 1 tablet by mouth daily.     Cardiovascular: ARB + Diuretic Combos Failed - 09/05/2021  2:46 PM      Failed - K in normal range and within 180 days    Potassium  Date Value Ref Range Status  09/05/2020 3.9 3.5 - 5.3 mmol/L Final  08/02/2017 3.6 3.5 - 5.1 mEq/L Final          Failed - Na in normal range and within 180 days    Sodium  Date Value Ref Range Status  09/05/2020 143 135 - 146 mmol/L Final  08/02/2017 137 136 - 145 mEq/L Final          Failed - Cr in normal range and within 180 days    Creatinine  Date Value Ref Range Status  08/02/2017 0.7 0.6 - 1.1 mg/dL Final   Creat  Date Value Ref Range Status  09/05/2020 0.67 0.50 - 1.10 mg/dL Final   Creatinine, Urine  Date Value Ref Range Status  09/05/2020 244 20 - 275 mg/dL Final          Failed - Ca in normal range and within 180 days    Calcium  Date Value Ref Range Status  09/05/2020 9.6 8.6 - 10.2 mg/dL Final  08/02/2017 8.9 8.4 - 10.4 mg/dL Final          Failed - Last BP in normal range    BP Readings from Last 1 Encounters:  09/05/20 (!) 142/80          Failed - Valid encounter within last 6 months    Recent Outpatient Visits           11 months ago Suspected COVID-19 virus infection   [facility] [place], Drue Stager, [place]   11 months ago Nonintractable headache, unspecified chronicity pattern, unspecified headache type   [place], [place]   1 year ago Hypertension, benign   [facility] Steele Sizer, MD   1 year ago Upper respiratory tract infection, unspecified type   [facility] Steele Sizer, MD   1 year ago Acute bronchitis, unspecified organism   [facility] Delsa Grana, PA-C       Future Appointments             In 1 month Steele Sizer, MD [facility], [place]   In 1 month Steele Sizer, MD [facility], [place] - Patient is not pregnant

## 2021-09-05 NOTE — Telephone Encounter (Signed)
Pt called in to schedule a medication follow up apt and to request a refill for her BP medication losartan-hydrochlorothiazide (HYZAAR) 100-12.5 MG tablet. Pt says that she was told by her pharmacy to contact her PCP office. Pt says that she has 1 pill left.    Pharmacy: [facility] - [place], [place] - [address]  [address], [place] [address]  Phone:  [phone]  Fax:  [phone]    Last ov. 09/30/20  Next ov: 10/10/21 (next available)

## 2021-09-06 MED ORDER — LOSARTAN POTASSIUM-HCTZ 100-12.5 MG PO TABS: 1.0000 | ORAL_TABLET | Freq: Every day | ORAL | 0 refills | Status: DC

## 2021-10-10 ENCOUNTER — Ambulatory Visit: Payer: Medicaid Other | Admitting: Family Medicine

## 2021-10-12 ENCOUNTER — Other Ambulatory Visit: Payer: Self-pay | Admitting: Family Medicine

## 2021-10-12 DIAGNOSIS — R809 Proteinuria, unspecified: Secondary | ICD-10-CM

## 2021-10-12 DIAGNOSIS — I10 Essential (primary) hypertension: Secondary | ICD-10-CM

## 2021-10-17 DIAGNOSIS — M5441 Lumbago with sciatica, right side: Secondary | ICD-10-CM | POA: Diagnosis not present

## 2021-10-17 DIAGNOSIS — I10 Essential (primary) hypertension: Secondary | ICD-10-CM | POA: Diagnosis not present

## 2021-10-23 ENCOUNTER — Encounter: Payer: Self-pay | Admitting: Hematology and Oncology

## 2021-10-23 ENCOUNTER — Ambulatory Visit: Payer: Medicaid Other | Admitting: Internal Medicine

## 2021-10-23 NOTE — Progress Notes (Deleted)
Acute Office Visit  Subjective:    Patient ID: Jill Davidson, female    DOB: 08-27-1971, 51 y.o.   MRN: [id number]  No chief complaint on file.   HPI Patient is in today for back pain. Went to UC on 10/17/21 for acute back pain. Lumbar x-rays performed but I do not have access to these results. She was prescribed Baclofen and Prednisone x 6 days and was given a Toradol injection.   HPI Patient presents to clinic with complaint of low back pain. Onset Friday night into Saturday morning. Denies any direct falls or injuries. She does report she does repetitive lifting of 10-12 pounds at work and is up and down on a ladder. She has been at this job for about a month. Reports never having back pain previously. At this time, this is not being filed as WC. She complains of 9/10, constant, sharp pain in low back. Pain is located across low back with radiation down right leg despite what is stated above. States pain is worse with position changes and walking. She has had some tingling in her right leg. She does report some vomiting and diarrhea yesterday, but this has since resolved. She increased her fluid intake and cranberry yesterday - had some urinary frequency, but denies dysuria, urgency, or hematuria. Currently denies fever, dyspnea, chest pain, nausea, vomiting, diarrhea, dysuria, urgency, hematuria, abdominal pain, saddle anesthesia, loss of bowel/bladder function, rash, or any other complaints. Has taken tylenol.   BACK PAIN Duration: {Blank single:19197::"days","weeks","months"} Mechanism of injury: {Blank single:19197::"lifting","MVA","no trauma","unknown"} Location: {Blank multiple:19196::"Right","Left","R>L","L>R","midline","bilateral","low back","upper back"} Onset: {Blank single:19197::"sudden","gradual"} Severity: {Blank single:19197::"mild","moderate","severe","1/10","2/10","3/10","4/10","5/10","6/10","7/10","8/10","9/10","10/10"} Quality: {Blank  multiple:19196::"sharp","dull","aching","burning","cramping","ill-defined","itchy","pressure-like","pulling","shooting","sore","stabbing","tender","tearing","throbbing"} Frequency: {Blank single:19197::"constant","intermittent","occasional","rare","every few minutes","a few times a hour","a few times a day","a few times a week","a few times a month","a few times a year"} Radiation: {Blank multiple:19196::"none","buttocks","R leg below the knee","R leg above the knee","L leg below the knee","L leg above the knee"} Aggravating factors: {Blank multiple:19196::"none","lifting","movement","walking","laying","bending","prolonged sitting","coughing","valsalva","Pain increased with coughing/valsalva"} Alleviating factors: {Blank multiple:19196::"nothing","rest","ice","heat","laying","NSAIDs","APAP","narcotics","muscle relaxer"} Status: {Blank multiple:19196::"better","worse","stable","fluctuating"} Treatments attempted: {Blank multiple:19196::"none","rest","ice","heat","APAP","ibuprofen","aleve","physical therapy","HEP","OMM"}  Relief with NSAIDs?: {Blank single:19197::"No NSAIDs Taken","no","mild","moderate","significant"} Nighttime pain:  {Blank single:19197::"yes","no"} Paresthesias / decreased sensation:  {Blank single:19197::"yes","no"} Bowel / bladder incontinence:  {Blank single:19197::"yes","no"} Fevers:  {Blank single:19197::"yes","no"} Dysuria / urinary frequency:  {Blank single:19197::"yes","no"}   Past Medical History:  Diagnosis Date   AR (allergic rhinitis)    Bacterial vaginosis    Complication of anesthesia 2008   PT STATES THAT DURING C-SECTION SHE WAS ADMINISTERED A GAS THAT CAUSED HER HEART TO BEAT IRREGULAR- PT HAS HAD NO PROBLEMS WITH THIS SINCE 2008   Dysrhythmia    DUE TO A GAS THAT WAS ADMINISTERED DURING A 2008 C-SECTION-NO PROBLEMS SINCE   Fibroid    GERD (gastroesophageal reflux disease)    OCC   Hypertension    IDA (iron deficiency anemia) 01/25/2015   UTI (lower  urinary tract infection)     Past Surgical History:  Procedure Laterality Date   CESAREAN SECTION     X2   DILATION AND CURETTAGE OF UTERUS     DILITATION & CURRETTAGE/HYSTROSCOPY WITH NOVASURE ABLATION N/A 07/23/2016   Procedure: DILATATION & CURETTAGE/HYSTEROSCOPY WITH NOVASURE ABLATION;  Surgeon: Brayton Mars, MD;  Location: [facility];  Service: Gynecology;  Laterality: N/A;   LAPAROSCOPIC VAGINAL HYSTERECTOMY WITH SALPINGECTOMY Bilateral 03/24/2018   Procedure: LAPAROSCOPIC ASSISTED VAGINAL HYSTERECTOMY WITH BILATERAL SALPINGECTOMY;  Surgeon: Brayton Mars, MD;  Location: [facility];  Service: Gynecology;  Laterality: Bilateral;    Family History  Problem Relation Age of Onset  Diabetes Father    Hypertension Father    Colon cancer Maternal Aunt    Breast cancer Maternal Aunt        great   Heart disease Maternal Aunt    Diabetes Maternal Grandmother    Hypertension Mother    Thyroid disease Mother    Lung cancer Mother    Asthma Mother    Hypertension Brother    Ovarian cancer Neg Hx     Social History   Socioeconomic History   Marital status: Single    Spouse name: Not on file   Number of children: Not on file   Years of education: Not on file   Highest education level: Not on file  Occupational History   Not on file  Tobacco Use   Smoking status: Every Day    Packs/day: 0.50    Years: 25.00    Pack years: 12.50    Types: Cigarettes    Start date: 07/03/1992   Smokeless tobacco: Never  Vaping Use   Vaping Use: Never used  Substance and Sexual Activity   Alcohol use: Yes    Comment: every 2-3 weeks   Drug use: Yes    Types: Marijuana    Comment: once a week   Sexual activity: Yes    Birth control/protection: None    Comment: Ablation  Other Topics Concern   Not on file  Social History Narrative   She is a Emergency planning/management officer, 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     acetic acid-hydrocortisone (VOSOL-HC) OTIC solution Place 3 drops into both ears 2 (two) times daily. 10 mL 0   fluticasone (FLONASE) 50 MCG/ACT nasal spray Place 2 sprays into both nostrils daily. 16 g 0   losartan-hydrochlorothiazide (HYZAAR) 100-12.5 MG tablet TAKE 1 TABLET BY MOUTH EVERY DAY 30 tablet 0   ondansetron (ZOFRAN) 4 MG tablet Take 1 tablet (4 mg total) by mouth every 8 (eight) hours as needed for nausea or vomiting. 20 tablet 0   SUMAtriptan (IMITREX) 100 MG tablet Take 1 tablet (100 mg total) by mouth every 2 (two) hours as needed for migraine. May repeat in 2 hours if headache persists or recurs. 10 tablet 0   traZODone (DESYREL) 50 MG tablet Take 0.5-1 tablets (25-50 mg total) by mouth at bedtime as needed for sleep. 30 tablet 10   Ubrogepant (UBRELVY) 100 MG TABS Take 1 tablet by mouth daily as needed. 10 tablet 0   No facility-administered medications prior to visit.    No Known Allergies  Review of Systems     Objective:    Physical Exam  LMP 03/21/2018 (Exact Date)  Wt Readings from Last 3 Encounters:  09/05/20 234 lb 12.8 oz (106.5 kg)  05/09/20 200 lb (90.7 kg)  09/10/19 214 lb 11.2 oz (97.4 kg)    Health Maintenance Due  Topic Date Due   COLONOSCOPY (Pts 45-49yr Insurance coverage will need  to be confirmed)  Never done   COVID-19 Vaccine (3 - Pfizer risk series) 02/27/2020   INFLUENZA VACCINE  04/17/2021    There are no preventive care reminders to display for this patient.   Lab Results  Component Value Date    TSH 2.25 09/05/2020   Lab Results  Component Value Date   WBC 14.9 (H) 09/05/2020   HGB 13.7 09/05/2020   HCT 40.6 09/05/2020   MCV 89.8 09/05/2020   PLT 357 09/05/2020   Lab Results  Component Value Date   NA 143 09/05/2020   K 3.9 09/05/2020   CHLORIDE 107 08/02/2017   CO2 29 09/05/2020   GLUCOSE 89 09/05/2020   BUN 14 09/05/2020   CREATININE 0.67 09/05/2020   BILITOT 0.2 09/05/2020   ALKPHOS 49 02/24/2018   AST 14 09/05/2020   ALT 9 09/05/2020   PROT 6.8 09/05/2020   ALBUMIN 3.9 02/24/2018   CALCIUM 9.6 09/05/2020   ANIONGAP 8 02/24/2018   EGFR >60 08/02/2017   Lab Results  Component Value Date   CHOL 159 09/05/2020   Lab Results  Component Value Date   HDL 39 (L) 09/05/2020   Lab Results  Component Value Date   LDLCALC 101 (H) 09/05/2020   Lab Results  Component Value Date   TRIG 94 09/05/2020   Lab Results  Component Value Date   CHOLHDL 4.1 09/05/2020   Lab Results  Component Value Date   HGBA1C 5.6 09/05/2020       Assessment & Plan:   Problem List Items Addressed This Visit   None    No orders of the defined types were placed in this encounter.    Teodora Medici, DO

## 2021-10-25 ENCOUNTER — Encounter: Payer: Medicaid Other | Admitting: Family Medicine

## 2021-11-14 ENCOUNTER — Other Ambulatory Visit: Payer: Self-pay | Admitting: Family Medicine

## 2021-11-14 DIAGNOSIS — R809 Proteinuria, unspecified: Secondary | ICD-10-CM

## 2021-11-14 DIAGNOSIS — I10 Essential (primary) hypertension: Secondary | ICD-10-CM

## 2021-11-20 ENCOUNTER — Encounter: Payer: Self-pay | Admitting: Internal Medicine

## 2021-12-19 ENCOUNTER — Other Ambulatory Visit: Payer: Self-pay | Admitting: Family Medicine

## 2021-12-19 DIAGNOSIS — R809 Proteinuria, unspecified: Secondary | ICD-10-CM

## 2021-12-19 DIAGNOSIS — I10 Essential (primary) hypertension: Secondary | ICD-10-CM

## 2022-01-28 ENCOUNTER — Other Ambulatory Visit: Payer: Self-pay

## 2022-01-28 ENCOUNTER — Inpatient Hospital Stay
Admission: EM | Admit: 2022-01-28 | Discharge: 2022-01-30 | DRG: 194 | Disposition: A | Discharge status: Home / Self Care | Payer: Medicaid Other | Attending: Internal Medicine | Admitting: Internal Medicine

## 2022-01-28 ENCOUNTER — Encounter: Payer: Self-pay | Admitting: Emergency Medicine

## 2022-01-28 ENCOUNTER — Emergency Department: Payer: Medicaid Other

## 2022-01-28 ENCOUNTER — Encounter: Payer: Self-pay | Admitting: Hematology and Oncology

## 2022-01-28 DIAGNOSIS — R55 Syncope and collapse: Secondary | ICD-10-CM | POA: Diagnosis not present

## 2022-01-28 DIAGNOSIS — A419 Sepsis, unspecified organism: Secondary | ICD-10-CM | POA: Diagnosis not present

## 2022-01-28 DIAGNOSIS — R000 Tachycardia, unspecified: Secondary | ICD-10-CM | POA: Diagnosis not present

## 2022-01-28 DIAGNOSIS — Z20822 Contact with and (suspected) exposure to covid-19: Secondary | ICD-10-CM | POA: Diagnosis present

## 2022-01-28 DIAGNOSIS — R918 Other nonspecific abnormal finding of lung field: Secondary | ICD-10-CM | POA: Diagnosis not present

## 2022-01-28 DIAGNOSIS — Z8249 Family history of ischemic heart disease and other diseases of the circulatory system: Secondary | ICD-10-CM | POA: Diagnosis not present

## 2022-01-28 DIAGNOSIS — N179 Acute kidney failure, unspecified: Secondary | ICD-10-CM

## 2022-01-28 DIAGNOSIS — K219 Gastro-esophageal reflux disease without esophagitis: Secondary | ICD-10-CM | POA: Diagnosis present

## 2022-01-28 DIAGNOSIS — J929 Pleural plaque without asbestos: Secondary | ICD-10-CM | POA: Diagnosis not present

## 2022-01-28 DIAGNOSIS — J189 Pneumonia, unspecified organism: Secondary | ICD-10-CM | POA: Diagnosis not present

## 2022-01-28 DIAGNOSIS — R42 Dizziness and giddiness: Secondary | ICD-10-CM | POA: Diagnosis not present

## 2022-01-28 DIAGNOSIS — I7789 Other specified disorders of arteries and arterioles: Secondary | ICD-10-CM | POA: Diagnosis not present

## 2022-01-28 DIAGNOSIS — E861 Hypovolemia: Secondary | ICD-10-CM | POA: Diagnosis present

## 2022-01-28 DIAGNOSIS — Z79899 Other long term (current) drug therapy: Secondary | ICD-10-CM

## 2022-01-28 DIAGNOSIS — I10 Essential (primary) hypertension: Secondary | ICD-10-CM | POA: Diagnosis present

## 2022-01-28 DIAGNOSIS — E876 Hypokalemia: Secondary | ICD-10-CM

## 2022-01-28 DIAGNOSIS — Q283 Other malformations of cerebral vessels: Secondary | ICD-10-CM | POA: Diagnosis not present

## 2022-01-28 DIAGNOSIS — R2681 Unsteadiness on feet: Principal | ICD-10-CM

## 2022-01-28 DIAGNOSIS — Z8349 Family history of other endocrine, nutritional and metabolic diseases: Secondary | ICD-10-CM | POA: Diagnosis not present

## 2022-01-28 DIAGNOSIS — J3489 Other specified disorders of nose and nasal sinuses: Secondary | ICD-10-CM | POA: Diagnosis not present

## 2022-01-28 DIAGNOSIS — E039 Hypothyroidism, unspecified: Secondary | ICD-10-CM | POA: Diagnosis present

## 2022-01-28 DIAGNOSIS — B1910 Unspecified viral hepatitis B without hepatic coma: Secondary | ICD-10-CM | POA: Diagnosis present

## 2022-01-28 DIAGNOSIS — D509 Iron deficiency anemia, unspecified: Secondary | ICD-10-CM | POA: Diagnosis present

## 2022-01-28 DIAGNOSIS — R590 Localized enlarged lymph nodes: Secondary | ICD-10-CM | POA: Diagnosis not present

## 2022-01-28 DIAGNOSIS — R471 Dysarthria and anarthria: Secondary | ICD-10-CM

## 2022-01-28 DIAGNOSIS — E871 Hypo-osmolality and hyponatremia: Secondary | ICD-10-CM | POA: Diagnosis present

## 2022-01-28 DIAGNOSIS — R509 Fever, unspecified: Secondary | ICD-10-CM | POA: Diagnosis not present

## 2022-01-28 DIAGNOSIS — T50905S Adverse effect of unspecified drugs, medicaments and biological substances, sequela: Secondary | ICD-10-CM | POA: Diagnosis not present

## 2022-01-28 DIAGNOSIS — E041 Nontoxic single thyroid nodule: Secondary | ICD-10-CM | POA: Diagnosis not present

## 2022-01-28 DIAGNOSIS — I639 Cerebral infarction, unspecified: Secondary | ICD-10-CM | POA: Diagnosis not present

## 2022-01-28 DIAGNOSIS — R0602 Shortness of breath: Secondary | ICD-10-CM | POA: Diagnosis not present

## 2022-01-28 DIAGNOSIS — R4781 Slurred speech: Secondary | ICD-10-CM | POA: Diagnosis not present

## 2022-01-28 DIAGNOSIS — T50905A Adverse effect of unspecified drugs, medicaments and biological substances, initial encounter: Secondary | ICD-10-CM

## 2022-01-28 DIAGNOSIS — F17210 Nicotine dependence, cigarettes, uncomplicated: Secondary | ICD-10-CM | POA: Diagnosis present

## 2022-01-28 DIAGNOSIS — T50B95A Adverse effect of other viral vaccines, initial encounter: Secondary | ICD-10-CM | POA: Diagnosis present

## 2022-01-28 DIAGNOSIS — J168 Pneumonia due to other specified infectious organisms: Secondary | ICD-10-CM | POA: Diagnosis not present

## 2022-01-28 DIAGNOSIS — Q048 Other specified congenital malformations of brain: Secondary | ICD-10-CM | POA: Diagnosis not present

## 2022-01-28 DIAGNOSIS — R4789 Other speech disturbances: Secondary | ICD-10-CM | POA: Diagnosis not present

## 2022-01-28 DIAGNOSIS — R531 Weakness: Secondary | ICD-10-CM | POA: Diagnosis not present

## 2022-01-28 LAB — CBC WITH DIFFERENTIAL/PLATELET
Abs Immature Granulocytes: 0.12 10*3/uL — ABNORMAL HIGH (ref 0.00–0.07)
Basophils Absolute: 0.1 10*3/uL (ref 0.0–0.1)
Basophils Relative: 0 %
Eosinophils Absolute: 0 10*3/uL (ref 0.0–0.5)
Eosinophils Relative: 0 %
HCT: 43.1 % (ref 36.0–46.0)
Hemoglobin: 14.6 g/dL (ref 12.0–15.0)
Immature Granulocytes: 1 %
Lymphocytes Relative: 11 %
Lymphs Abs: 2 10*3/uL (ref 0.7–4.0)
MCH: 28.8 pg (ref 26.0–34.0)
MCHC: 33.9 g/dL (ref 30.0–36.0)
MCV: 85 fL (ref 80.0–100.0)
Monocytes Absolute: 0.8 10*3/uL (ref 0.1–1.0)
Monocytes Relative: 5 %
Neutro Abs: 15.1 10*3/uL — ABNORMAL HIGH (ref 1.7–7.7)
Neutrophils Relative %: 83 %
Platelets: 257 10*3/uL (ref 150–400)
RBC: 5.07 MIL/uL (ref 3.87–5.11)
RDW: 13.2 % (ref 11.5–15.5)
WBC: 18.1 10*3/uL — ABNORMAL HIGH (ref 4.0–10.5)
nRBC: 0 % (ref 0.0–0.2)

## 2022-01-28 LAB — TROPONIN I (HIGH SENSITIVITY)
Troponin I (High Sensitivity): 14 ng/L (ref <18)
Troponin I (High Sensitivity): 15 ng/L (ref <18)

## 2022-01-28 LAB — COMPREHENSIVE METABOLIC PANEL
ALT: 34 U/L (ref 0–44)
AST: 61 U/L — ABNORMAL HIGH (ref 15–41)
Albumin: 3.3 g/dL — ABNORMAL LOW (ref 3.5–5.0)
Alkaline Phosphatase: 56 U/L (ref 38–126)
Anion gap: 13 (ref 5–15)
BUN: 21 mg/dL — ABNORMAL HIGH (ref 6–20)
CO2: 24 mmol/L (ref 22–32)
Calcium: 8.4 mg/dL — ABNORMAL LOW (ref 8.9–10.3)
Chloride: 93 mmol/L — ABNORMAL LOW (ref 98–111)
Creatinine, Ser: 1.11 mg/dL — ABNORMAL HIGH (ref 0.44–1.00)
GFR, Estimated: >60 mL/min (ref >60)
Glucose, Bld: 157 mg/dL — ABNORMAL HIGH (ref 70–99)
Potassium: 3.1 mmol/L — ABNORMAL LOW (ref 3.5–5.1)
Sodium: 130 mmol/L — ABNORMAL LOW (ref 135–145)
Total Bilirubin: 0.5 mg/dL (ref 0.3–1.2)
Total Protein: 7.9 g/dL (ref 6.5–8.1)

## 2022-01-28 LAB — URINALYSIS, COMPLETE (UACMP) WITH MICROSCOPIC
Bilirubin Urine: NEGATIVE
Glucose, UA: NEGATIVE mg/dL
Ketones, ur: 5 mg/dL — AB
Leukocytes,Ua: NEGATIVE
Nitrite: NEGATIVE
Protein, ur: 100 mg/dL — AB
Specific Gravity, Urine: 1.023 (ref 1.005–1.030)
pH: 5 (ref 5.0–8.0)

## 2022-01-28 LAB — APTT: aPTT: 40 seconds — ABNORMAL HIGH (ref 24–36)

## 2022-01-28 LAB — MAGNESIUM: Magnesium: 2.2 mg/dL (ref 1.7–2.4)

## 2022-01-28 LAB — TSH: TSH: 0.888 u[IU]/mL (ref 0.350–4.500)

## 2022-01-28 LAB — RESP PANEL BY RT-PCR (FLU A&B, COVID) ARPGX2
Influenza A by PCR: NEGATIVE
Influenza B by PCR: NEGATIVE
SARS Coronavirus 2 by RT PCR: NEGATIVE

## 2022-01-28 LAB — PROTIME-INR
INR: 1.2 (ref 0.8–1.2)
Prothrombin Time: 14.9 seconds (ref 11.4–15.2)

## 2022-01-28 LAB — PROCALCITONIN: Procalcitonin: 0.71 ng/mL

## 2022-01-28 LAB — CBG MONITORING, ED: Glucose-Capillary: 183 mg/dL — ABNORMAL HIGH (ref 70–99)

## 2022-01-28 LAB — STREP PNEUMONIAE URINARY ANTIGEN: Strep Pneumo Urinary Antigen: NEGATIVE

## 2022-01-28 LAB — LACTIC ACID, PLASMA: Lactic Acid, Venous: 1.4 mmol/L (ref 0.5–1.9)

## 2022-01-28 LAB — HCG, QUANTITATIVE, PREGNANCY: hCG, Beta Chain, Quant, S: 1 m[IU]/mL (ref <5)

## 2022-01-28 MED ORDER — ENOXAPARIN SODIUM 60 MG/0.6ML IJ SOSY
0.5000 mg/kg | PREFILLED_SYRINGE | INTRAMUSCULAR | Status: DC
Start: 2022-01-28 — End: 2022-01-30
  Administered 2022-01-28 – 2022-01-29 (×2): 50 mg via SUBCUTANEOUS
  Filled 2022-01-28 (×2): qty 0.6

## 2022-01-28 MED ORDER — POTASSIUM CHLORIDE 10 MEQ/100ML IV SOLN
10.0000 meq | INTRAVENOUS | Status: AC
  Administered 2022-01-28 (×2): 10 meq via INTRAVENOUS
  Filled 2022-01-28 (×2): qty 100

## 2022-01-28 MED ORDER — LACTATED RINGERS IV BOLUS
1000.0000 mL | Freq: Once | INTRAVENOUS | Status: AC
  Administered 2022-01-28: 1000 mL via INTRAVENOUS

## 2022-01-28 MED ORDER — SODIUM CHLORIDE 0.9 % IV SOLN
500.0000 mg | Freq: Once | INTRAVENOUS | Status: AC
  Administered 2022-01-28: 500 mg via INTRAVENOUS
  Filled 2022-01-28: qty 5

## 2022-01-28 MED ORDER — POTASSIUM CHLORIDE 10 MEQ/100ML IV SOLN: 10.0000 meq | INTRAVENOUS | Status: DC

## 2022-01-28 MED ORDER — LACTATED RINGERS IV SOLN: INTRAVENOUS | Status: AC

## 2022-01-28 MED ORDER — SODIUM CHLORIDE 0.9 % IV SOLN: INTRAVENOUS | Status: DC

## 2022-01-28 MED ORDER — SODIUM CHLORIDE 0.9 % IV SOLN
500.0000 mg | INTRAVENOUS | Status: DC
  Administered 2022-01-29 – 2022-01-30 (×2): 500 mg via INTRAVENOUS
  Filled 2022-01-28: qty 5
  Filled 2022-01-28: qty 500

## 2022-01-28 MED ORDER — SODIUM CHLORIDE 0.9 % IV SOLN
2.0000 g | INTRAVENOUS | Status: DC
  Administered 2022-01-29 – 2022-01-30 (×2): 2 g via INTRAVENOUS
  Filled 2022-01-28: qty 20
  Filled 2022-01-28: qty 2

## 2022-01-28 MED ORDER — IOHEXOL 350 MG/ML SOLN
75.0000 mL | Freq: Once | INTRAVENOUS | Status: AC | PRN
  Administered 2022-01-28: 75 mL via INTRAVENOUS

## 2022-01-28 MED ORDER — SODIUM CHLORIDE 0.9 % IV SOLN
1.0000 g | Freq: Once | INTRAVENOUS | Status: AC
  Administered 2022-01-28: 1 g via INTRAVENOUS
  Filled 2022-01-28: qty 10

## 2022-01-28 MED ORDER — POTASSIUM CHLORIDE CRYS ER 20 MEQ PO TBCR
40.0000 meq | EXTENDED_RELEASE_TABLET | Freq: Once | ORAL | Status: AC
  Administered 2022-01-28: 40 meq via ORAL
  Filled 2022-01-28: qty 2

## 2022-01-28 MED ORDER — ENOXAPARIN SODIUM 40 MG/0.4ML IJ SOSY: 40.0000 mg | PREFILLED_SYRINGE | INTRAMUSCULAR | Status: DC

## 2022-01-28 NOTE — Progress Notes (Signed)
Sepsis protocol is being monitored by eLink. 

## 2022-01-28 NOTE — Progress Notes (Signed)
CODE SEPSIS - PHARMACY COMMUNICATION ? ?**Broad Spectrum Antibiotics should be administered within 1 hour of Sepsis diagnosis** ? ?Time Code Sepsis Called/Page Received: 3212 ? ?Antibiotics Ordered: Ceftriaxone ? ?Time of 1st antibiotic administration: 1100 ? ?Additional action taken by pharmacy: N/A ? ?Benita Gutter ?01/28/2022  10:42 AM  ?

## 2022-01-28 NOTE — ED Triage Notes (Signed)
Pt ems from home for weakness that started Friday at 12:00 noon. Pt thinks that weakness is related to hep B shot on Thursday. ?

## 2022-01-28 NOTE — ED Notes (Signed)
Pt oob to br with 1 assist. Pt is moving better than earlier ?

## 2022-01-28 NOTE — Progress Notes (Signed)
PHARMACIST - PHYSICIAN COMMUNICATION ? ?CONCERNING:  Enoxaparin (Lovenox) for DVT Prophylaxis  ? ? ?RECOMMENDATION: ?Patient was prescribed enoxaprin '40mg'$  q24 hours for VTE prophylaxis.  ? Danley Danker Weights  ? 01/28/22 0913  ?Weight: 99.8 kg (220 lb)  ? ? ?Body mass index is 38.97 kg/m?. ? ?Estimated Creatinine Clearance: 69.9 mL/min (A) (by C-G formula based on SCr of 1.11 mg/dL (H)). ? ? ?Based on [place] patient is candidate for enoxaparin 0.'5mg'$ /kg TBW SQ every 24 hours based on BMI being >30. ? ? ?DESCRIPTION: ?Pharmacy has adjusted enoxaparin dose per [facility] policy. ? ?Patient is now receiving enoxaparin 50 mg every 24 hours  ? ? ?Sherilyn Banker, PharmD ?Clinical Pharmacist  ?01/28/2022 ?2:11 PM ? ?

## 2022-01-28 NOTE — ED Provider Notes (Signed)
? ?[facility] ?Provider Note ? ? ? Event Date/Time  ? First MD Initiated Contact with Patient 01/28/22 [id number]   ?  (approximate) ? ? ?History  ? ?Weakness ? ? ?HPI ? ?Jill Davidson is a 51 y.o. female patient with history of HTN, GERD, hypothyroidism fibroid, and recent hepatitis B vaccines 5/11 but patient states she is secondary to her vaccine series presents for evaluation of worsening generalized weakness that she states started the day after.  States she has passed out several times and think she fell couple times last night but did not hit her head and she lowered self to ground to try.  She states she has some slight shortness of breath but no fevers, chest pain, cough, nausea, vomiting, diarrhea, burning with urination abdominal pain.  Denies any focal weakness, headache or sore throat.  States her vision seemed a little blurry in both eyes but does not be painful vision or vision.  She has had a decreased appetite in the last couple days.  No new medication changes and she denies any illicit drug use or EtOH use.  She is not on blood thinners.  She denies any significant cardiac or pulmonary or neurological history. ? ?  ?Past Medical History:  ?Diagnosis Date  ? AR (allergic rhinitis)   ? Bacterial vaginosis   ? Complication of anesthesia 2008  ? PT STATES THAT DURING C-SECTION SHE WAS ADMINISTERED A GAS THAT CAUSED HER HEART TO BEAT IRREGULAR- PT HAS HAD NO PROBLEMS WITH THIS SINCE 2008  ? Dysrhythmia   ? DUE TO A GAS THAT WAS ADMINISTERED DURING A 2008 C-SECTION-NO PROBLEMS SINCE  ? Fibroid   ? GERD (gastroesophageal reflux disease)   ? OCC  ? Hypertension   ? IDA (iron deficiency anemia) 01/25/2015  ? UTI (lower urinary tract infection)   ? ? ? ?Physical Exam  ?Triage Vital Signs: ?ED Triage Vitals  ?Enc Vitals Group  ?   BP 01/28/22 0859 104/75  ?   Pulse --   ?   Resp 01/28/22 0859 (!) 22  ?   Temp 01/28/22 0859 98.6 ?F (37 ?C)  ?   Temp src --   ?   SpO2 01/28/22 0859 95 %  ?    Weight --   ?   Height --   ?   Head Circumference --   ?   Peak Flow --   ?   Pain Score 01/28/22 0900 0  ?   Pain Loc --   ?   Pain Edu? --   ?   Excl. in [place]? --   ? ? ?Most recent vital signs: ?Vitals:  ? 01/28/22 1130 01/28/22 1200  ?BP: 119/72 109/87  ?Pulse: 88 89  ?Resp: 15 19  ?Temp:    ?SpO2: 95% 100%  ? ? ?General: Awake, no distress.  ?CV:  No significant murmur.  Slightly prolonged capillary fill in the digits..  2+ radial pulse. ?Resp:  Normal effort.  Clear bilaterally. ?Abd:  No distention.  Soft throughout. ?Other:  Cranial nerves II through XII grossly intact.  No pronator drift.  No finger dysmetria.  Symmetric 5/5 strength of all extremities.  Sensation intact to light touch in all extremities.  No tenderness along the C/T/L-spine.  All discharge extremities ? ? ?ED Results / Procedures / Treatments  ?Labs ?(all labs ordered are listed, but only abnormal results are displayed) ?Labs Reviewed  ?COMPREHENSIVE METABOLIC PANEL - Abnormal; Notable for the following components:  ?  Result Value  ? Sodium 130 (*)   ? Potassium 3.1 (*)   ? Chloride 93 (*)   ? Glucose, Bld 157 (*)   ? BUN 21 (*)   ? Creatinine, Ser 1.11 (*)   ? Calcium 8.4 (*)   ? Albumin 3.3 (*)   ? AST 61 (*)   ? All other components within normal limits  ?CBC WITH DIFFERENTIAL/PLATELET - Abnormal; Notable for the following components:  ? WBC 18.1 (*)   ? Neutro Abs 15.1 (*)   ? Abs Immature Granulocytes 0.12 (*)   ? All other components within normal limits  ?APTT - Abnormal; Notable for the following components:  ? aPTT 40 (*)   ? All other components within normal limits  ?URINALYSIS, COMPLETE (UACMP) WITH MICROSCOPIC - Abnormal; Notable for the following components:  ? Color, Urine AMBER (*)   ? APPearance CLOUDY (*)   ? Hgb urine dipstick LARGE (*)   ? Ketones, ur 5 (*)   ? Protein, ur 100 (*)   ? Bacteria, UA MANY (*)   ? All other components within normal limits  ?CBG MONITORING, ED - Abnormal; Notable for the following  components:  ? Glucose-Capillary 183 (*)   ? All other components within normal limits  ?RESP PANEL BY RT-PCR (FLU A&B, COVID) [id number]  ?CULTURE, BLOOD (ROUTINE X 2)  ?CULTURE, BLOOD (ROUTINE X 2)  ?URINE CULTURE  ?LACTIC ACID, PLASMA  ?PROTIME-INR  ?HCG, QUANTITATIVE, PREGNANCY  ?PROCALCITONIN  ?MAGNESIUM  ?TSH  ?LACTIC ACID, PLASMA  ?POC URINE PREG, ED  ?TROPONIN I (HIGH SENSITIVITY)  ?TROPONIN I (HIGH SENSITIVITY)  ? ? ? ?EKG ? ?ECG is markable for sinus tachycardia with ventricular rate of 119, normal axis, intervals with nonspecific ST change in aVL without other clear evidence of acute ischemia or significant arrhythmia. ? ? ?RADIOLOGY ?I reviewed patient's CT head and there is no evidence on my interpretation of acute ischemia, hemorrhage, edema or shift.  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 ? ?Chest x-ray my review shows appears to be right lower lobe opacity concerning for pneumonia.  I do not see any overt edema pneumothorax or effusion.  I reviewed radiologist rotation and agree with their findings, no significant atelectasis in the right lobe. ? ?CTA chest on my interpretation without evidence of large PE but there is some consolidation in the right lower lobe suggestive of pneumonia.  I do not see any other acute process.  I also reviewed radiologist interpretation. ? ?PROCEDURES: ? ?Critical Care performed: Yes, see critical care procedure note(s) ? ?.1-3 Lead EKG Interpretation ?Performed by: Lucrezia Starch, MD ?Authorized by: Lucrezia Starch, MD  ? ?  Interpretation: normal   ?  ECG rate assessment: normal   ?  Rhythm: sinus tachycardia   ?  Ectopy: none   ?  Conduction: normal   ?.Critical Care ?Performed by: Lucrezia Starch, MD ?Authorized by: Lucrezia Starch, MD  ? ?Critical care provider statement:  ?  Critical care time (minutes):  30 ?  Critical care was necessary to treat or prevent imminent or life-threatening  deterioration of the following conditions:  Sepsis ?  Critical care was time spent personally by me on the following activities:  Development of treatment plan with patient or surrogate, discussions with consultants, evaluation of patient's response to treatment, examination of patient, ordering and review of laboratory studies, ordering and review of radiographic studies, ordering and  performing treatments and interventions, pulse oximetry, re-evaluation of patient's condition and review of old charts ? ?The patient is on the cardiac monitor to evaluate for evidence of arrhythmia and/or significant heart rate changes. ? ? ?MEDICATIONS ORDERED IN ED: ?Medications  ?lactated ringers infusion (0 mLs Intravenous Hold 01/28/22 1203)  ?azithromycin (ZITHROMAX) 500 mg in sodium chloride 0.9 % 250 mL IVPB (500 mg Intravenous New Bag/Given 01/28/22 1202)  ?lactated ringers bolus 1,000 mL (1,000 mLs Intravenous New Bag/Given 01/28/22 1029)  ?lactated ringers bolus 1,000 mL (1,000 mLs Intravenous New Bag/Given 01/28/22 1102)  ?cefTRIAXone (ROCEPHIN) 1 g in sodium chloride 0.9 % 100 mL IVPB (0 g Intravenous Stopped 01/28/22 1144)  ?potassium chloride SA (KLOR-CON M) CR tablet 40 mEq (40 mEq Oral Given 01/28/22 1203)  ?iohexol (OMNIPAQUE) 350 MG/ML injection 75 mL (75 mLs Intravenous Contrast Given 01/28/22 1145)  ? ? ? ?IMPRESSION / MDM / ASSESSMENT AND PLAN / ED COURSE  ?I reviewed the triage vital signs and the nursing notes. ?             ?               ? ?Differential diagnosis includes, but is not limited to dehydration, acute infectious process, arrhythmia, anemia, metabolic derangement and endocrine derangement.  Patient not have any clear focal deficits at this time to suggest acute CVA.  She states she will order symptoms extremities about the past does not think she fell or hit her head.  Suspicion for toxic ingestion or withdrawal. ? ?ECG is markable for sinus tachycardia with ventricular rate of 119, normal axis,  intervals with nonspecific ST change in aVL without other clear evidence of acute ischemia or significant arrhythmia. ? ?I reviewed patient's CT head and there is no evidence  of acute ischemia, hemorrhage, edema or s

## 2022-01-28 NOTE — H&P (Signed)
?History and Physical  ? ? ?Jill Davidson NWG:[id number] DOB: [birth year] DOA: 01/28/2022 ? ?PCP: Steele Sizer, MD  ?Patient coming from: home ? ?I have personally briefly reviewed patient's old medical records in [place] ? ?Chief Complaint: weakness/presyncope  ? ?HPI: Jill Davidson is a 51 y.o. female with medical history significant of  HTN, GERD, hypothyroidism, IDA, recent hepatitis B vaccine 5/11 who presents to ed with 2 days of generalized weakness, presyncope as well as mild sob. Patient notes that symptoms started day after obtaining Hepatitis B vaccine. She notes no n/v/d/dysuria/ chest pain,  sore throat or sick contacts. She note minimal non-productive cough however. She noted he main symptom however is that of fatigue and generalized weakness. ? ? ?ED Course:  ?Afeb, bp 104/75, hr 117, rr 22 ? ?Labs: ?Wbc 18.1, hgb 14.6, plt 257,  ?NA 130 (143),K 3.1(hypokalemia),  cr 1.1 ( 0.67) Albumin 3.3,  ?Ast 61 ?Mag 22, tsh 0.88 ?Procalcitonin: 0.71 ?Lactic 1.4 ?Ua: +bacteria, + large blood ?Respiratory panel:neg ?CT head: NAD ? ?CT: ?1. No pulmonary embolism. ?2. Constellation of findings likely reflect RIGHT lower lobe ?pneumonia with reactive hilar and mediastinal adenopathy ? ?Cxr: IMPRESSION: ?Hazy opacity at the right base compatible with atelectasis or ?pneumonia. ? ?Tx  ctx 1gram/azithromycin, LR1L ?Review of Systems: As per HPI otherwise 10 point review of systems negative.  ? ?Past Medical History:  ?Diagnosis Date  ? AR (allergic rhinitis)   ? Bacterial vaginosis   ? Complication of anesthesia 2008  ? PT STATES THAT DURING C-SECTION SHE WAS ADMINISTERED A GAS THAT CAUSED HER HEART TO BEAT IRREGULAR- PT HAS HAD NO PROBLEMS WITH THIS SINCE 2008  ? Dysrhythmia   ? DUE TO A GAS THAT WAS ADMINISTERED DURING A 2008 C-SECTION-NO PROBLEMS SINCE  ? Fibroid   ? GERD (gastroesophageal reflux disease)   ? OCC  ? Hypertension   ? IDA (iron deficiency anemia) 01/25/2015  ? UTI (lower urinary tract  infection)   ? ? ?Past Surgical History:  ?Procedure Laterality Date  ? CESAREAN SECTION    ? X2  ? DILATION AND CURETTAGE OF UTERUS    ? DILITATION & CURRETTAGE/HYSTROSCOPY WITH NOVASURE ABLATION N/A 07/23/2016  ? Procedure: DILATATION & CURETTAGE/HYSTEROSCOPY WITH NOVASURE ABLATION;  Surgeon: Brayton Mars, MD;  Location: [facility] ORS;  Service: Gynecology;  Laterality: N/A;  ? LAPAROSCOPIC VAGINAL HYSTERECTOMY WITH SALPINGECTOMY Bilateral 03/24/2018  ? Procedure: LAPAROSCOPIC ASSISTED VAGINAL HYSTERECTOMY WITH BILATERAL SALPINGECTOMY;  Surgeon: Brayton Mars, MD;  Location: [facility] ORS;  Service: Gynecology;  Laterality: Bilateral;  ? ? ? reports that she has been smoking cigarettes. She started smoking about 29 years ago. She has a 12.50 pack-year smoking history. She has never used smokeless tobacco. She reports current alcohol use. She reports current drug use. Drug: Marijuana. ? ?No Known Allergies ? ?Family History  ?Problem Relation Age of Onset  ? Diabetes Father   ? Hypertension Father   ? Colon cancer Maternal Aunt   ? Breast cancer Maternal Aunt   ?     great  ? Heart disease Maternal Aunt   ? Diabetes Maternal Grandmother   ? Hypertension Mother   ? Thyroid disease Mother   ? Lung cancer Mother   ? Asthma Mother   ? Hypertension Brother   ? Ovarian cancer Neg Hx   ? ? ?Prior to Admission medications   ?Medication Sig Start Date End Date Taking? Authorizing Provider  ?acetaminophen (TYLENOL) 325 MG tablet Take 650 mg by  mouth every 6 (six) hours as needed.    [provider]  ?acetic acid-hydrocortisone (VOSOL-HC) OTIC solution Place 3 drops into both ears 2 (two) times daily. 09/05/20   Steele Sizer, MD  ?fluticasone (FLONASE) 50 MCG/ACT nasal spray Place 2 sprays into both nostrils daily. 08/17/20   Steele Sizer, MD  ?losartan-hydrochlorothiazide (HYZAAR) 100-12.5 MG tablet TAKE 1 TABLET BY MOUTH EVERY DAY 11/14/21   Steele Sizer, MD  ?ondansetron (ZOFRAN) 4 MG tablet Take 1  tablet (4 mg total) by mouth every 8 (eight) hours as needed for nausea or vomiting. 09/30/20   Steele Sizer, MD  ?SUMAtriptan (IMITREX) 100 MG tablet Take 1 tablet (100 mg total) by mouth every 2 (two) hours as needed for migraine. May repeat in 2 hours if headache persists or recurs. 09/30/20   Steele Sizer, MD  ?traZODone (DESYREL) 50 MG tablet Take 0.5-1 tablets (25-50 mg total) by mouth at bedtime as needed for sleep. 09/05/20   Steele Sizer, MD  ?Ubrogepant (UBRELVY) 100 MG TABS Take 1 tablet by mouth daily as needed. 09/30/20   Steele Sizer, MD  ? ? ?Physical Exam: ?Vitals:  ? 01/28/22 1030 01/28/22 1100 01/28/22 1130 01/28/22 1200  ?BP: 113/72 119/73 119/72 109/87  ?Pulse: 96 86 88 89  ?Resp: (!) '21 18 15 19  '$ ?Temp:      ?SpO2: 95% 95% 95% 100%  ?Weight:      ?Height:      ? ? ? ?Vitals:  ? 01/28/22 1030 01/28/22 1100 01/28/22 1130 01/28/22 1200  ?BP: 113/72 119/73 119/72 109/87  ?Pulse: 96 86 88 89  ?Resp: (!) '21 18 15 19  '$ ?Temp:      ?SpO2: 95% 95% 95% 100%  ?Weight:      ?Height:      ?Constitutional: NAD, calm, comfortable ?Eyes: PERRL, lids and conjunctivae normal ?ENMT: Mucous membranes are moist. Posterior pharynx clear of any exudate or lesions.Normal dentition.  ?Neck: normal, supple, no masses, no thyromegaly ?Respiratory: clear to auscultation bilaterally, no wheezing, no crackles. Normal respiratory effort. No accessory muscle use.  ?Cardiovascular: Regular rate and rhythm, no murmurs / rubs / gallops. No extremity edema. 2+ pedal pulses. No carotid bruits.  ?Abdomen: no tenderness, no masses palpated. No hepatosplenomegaly. Bowel sounds positive.  ?Musculoskeletal: no clubbing / cyanosis. No joint deformity upper and lower extremities. Good ROM, no contractures. Normal muscle tone.  ?Skin: no rashes, lesions, ulcers. No induration ?Neurologic: CN 2-12 grossly intact. Sensation intact,  Strength 5/5 in all 4.  ?Psychiatric: Normal judgment and insight. Alert and oriented x 3. Normal  mood.  ? ? ?Labs on Admission: I have personally reviewed following labs and imaging studies ? ?CBC: ?Recent Labs  ?Lab 01/28/22 ?0981  ?WBC 18.1*  ?NEUTROABS 15.1*  ?HGB 14.6  ?HCT 43.1  ?MCV 85.0  ?PLT 257  ? ?Basic Metabolic Panel: ?Recent Labs  ?Lab 01/28/22 ?1914  ?NA 130*  ?K 3.1*  ?CL 93*  ?CO2 24  ?GLUCOSE 157*  ?BUN 21*  ?CREATININE 1.11*  ?CALCIUM 8.4*  ?MG 2.2  ? ?GFR: ?Estimated Creatinine Clearance: 69.9 mL/min (A) (by C-G formula based on SCr of 1.11 mg/dL (H)). ?Liver Function Tests: ?Recent Labs  ?Lab 01/28/22 ?7829  ?AST 61*  ?ALT 34  ?ALKPHOS 56  ?BILITOT 0.5  ?PROT 7.9  ?ALBUMIN 3.3*  ? ?No results for input(s): LIPASE, AMYLASE in the last 168 hours. ?No results for input(s): AMMONIA in the last 168 hours. ?Coagulation Profile: ?Recent Labs  ?Lab 01/28/22 ?5621  ?  INR 1.2  ? ?Cardiac Enzymes: ?No results for input(s): CKTOTAL, CKMB, CKMBINDEX, TROPONINI in the last 168 hours. ?BNP (last 3 results) ?No results for input(s): PROBNP in the last 8760 hours. ?HbA1C: ?No results for input(s): HGBA1C in the last 72 hours. ?CBG: ?Recent Labs  ?Lab 01/28/22 ?0902  ?GLUCAP 183*  ? ?Lipid Profile: ?No results for input(s): CHOL, HDL, LDLCALC, TRIG, CHOLHDL, LDLDIRECT in the last 72 hours. ?Thyroid Function Tests: ?Recent Labs  ?  01/28/22 ?4401  ?TSH 0.888  ? ?Anemia Panel: ?No results for input(s): VITAMINB12, FOLATE, FERRITIN, TIBC, IRON, RETICCTPCT in the last 72 hours. ?Urine analysis: ?   ?Component Value Date/Time  ? COLORURINE AMBER (A) 01/28/2022 1019  ? APPEARANCEUR CLOUDY (A) 01/28/2022 1019  ? LABSPEC 1.023 01/28/2022 1019  ? PHURINE 5.0 01/28/2022 1019  ? GLUCOSEU NEGATIVE 01/28/2022 1019  ? HGBUR LARGE (A) 01/28/2022 1019  ? [place] NEGATIVE 01/28/2022 1019  ? BILIRUBINUR neg 08/15/2015 0950  ? KETONESUR 5 (A) 01/28/2022 1019  ? PROTEINUR 100 (A) 01/28/2022 1019  ? UROBILINOGEN negative 08/15/2015 0950  ? UROBILINOGEN 1.0 05/07/2012 1338  ? NITRITE NEGATIVE 01/28/2022 1019  ? LEUKOCYTESUR  NEGATIVE 01/28/2022 1019  ? ? ?Radiological Exams on Admission: ?DG Chest 2 View ? ?Result Date: 01/28/2022 ?CLINICAL DATA:  Shortness of breath. EXAM: CHEST - 2 VIEW COMPARISON:  None Available. FINDINGS: Hazy opacity a

## 2022-01-28 NOTE — ED Notes (Signed)
Pt to CT

## 2022-01-29 ENCOUNTER — Inpatient Hospital Stay: Payer: Medicaid Other

## 2022-01-29 DIAGNOSIS — J189 Pneumonia, unspecified organism: Secondary | ICD-10-CM | POA: Diagnosis not present

## 2022-01-29 DIAGNOSIS — N179 Acute kidney failure, unspecified: Secondary | ICD-10-CM

## 2022-01-29 DIAGNOSIS — T50905A Adverse effect of unspecified drugs, medicaments and biological substances, initial encounter: Secondary | ICD-10-CM

## 2022-01-29 DIAGNOSIS — I10 Essential (primary) hypertension: Secondary | ICD-10-CM

## 2022-01-29 DIAGNOSIS — R471 Dysarthria and anarthria: Secondary | ICD-10-CM | POA: Diagnosis not present

## 2022-01-29 DIAGNOSIS — E876 Hypokalemia: Secondary | ICD-10-CM

## 2022-01-29 LAB — COMPREHENSIVE METABOLIC PANEL
ALT: 32 U/L (ref 0–44)
AST: 58 U/L — ABNORMAL HIGH (ref 15–41)
Albumin: 2.5 g/dL — ABNORMAL LOW (ref 3.5–5.0)
Alkaline Phosphatase: 40 U/L (ref 38–126)
Anion gap: 8 (ref 5–15)
BUN: 12 mg/dL (ref 6–20)
CO2: 25 mmol/L (ref 22–32)
Calcium: 8.2 mg/dL — ABNORMAL LOW (ref 8.9–10.3)
Chloride: 102 mmol/L (ref 98–111)
Creatinine, Ser: 0.79 mg/dL (ref 0.44–1.00)
GFR, Estimated: >60 mL/min (ref >60)
Glucose, Bld: 105 mg/dL — ABNORMAL HIGH (ref 70–99)
Potassium: 3.5 mmol/L (ref 3.5–5.1)
Sodium: 135 mmol/L (ref 135–145)
Total Bilirubin: 0.3 mg/dL (ref 0.3–1.2)
Total Protein: 5.9 g/dL — ABNORMAL LOW (ref 6.5–8.1)

## 2022-01-29 LAB — CBC WITH DIFFERENTIAL/PLATELET
Abs Immature Granulocytes: 0.17 10*3/uL — ABNORMAL HIGH (ref 0.00–0.07)
Basophils Absolute: 0 10*3/uL (ref 0.0–0.1)
Basophils Relative: 0 %
Eosinophils Absolute: 0 10*3/uL (ref 0.0–0.5)
Eosinophils Relative: 0 %
HCT: 34.2 % — ABNORMAL LOW (ref 36.0–46.0)
Hemoglobin: 11.6 g/dL — ABNORMAL LOW (ref 12.0–15.0)
Immature Granulocytes: 2 %
Lymphocytes Relative: 28 %
Lymphs Abs: 2.9 10*3/uL (ref 0.7–4.0)
MCH: 29.2 pg (ref 26.0–34.0)
MCHC: 33.9 g/dL (ref 30.0–36.0)
MCV: 86.1 fL (ref 80.0–100.0)
Monocytes Absolute: 0.8 10*3/uL (ref 0.1–1.0)
Monocytes Relative: 8 %
Neutro Abs: 6.6 10*3/uL (ref 1.7–7.7)
Neutrophils Relative %: 62 %
Platelets: 208 10*3/uL (ref 150–400)
RBC: 3.97 MIL/uL (ref 3.87–5.11)
RDW: 13.3 % (ref 11.5–15.5)
WBC: 10.6 10*3/uL — ABNORMAL HIGH (ref 4.0–10.5)
nRBC: 0 % (ref 0.0–0.2)

## 2022-01-29 LAB — HIV ANTIBODY (ROUTINE TESTING W REFLEX): HIV Screen 4th Generation wRfx: NONREACTIVE

## 2022-01-29 LAB — PROCALCITONIN: Procalcitonin: 0.37 ng/mL

## 2022-01-29 MED ORDER — GADOBUTROL 1 MMOL/ML IV SOLN
10.0000 mL | Freq: Once | INTRAVENOUS | Status: AC | PRN
  Administered 2022-01-29: 10 mL via INTRAVENOUS

## 2022-01-29 MED ORDER — ASPIRIN EC 81 MG PO TBEC
81.0000 mg | DELAYED_RELEASE_TABLET | Freq: Every day | ORAL | Status: DC
Start: 2022-01-29 — End: 2022-01-30
  Administered 2022-01-29 – 2022-01-30 (×2): 81 mg via ORAL
  Filled 2022-01-29 (×2): qty 1

## 2022-01-29 NOTE — Progress Notes (Signed)
?  Progress Note ? ? ?Patient: Jill Davidson QQI:[id number] DOB: [birth year] DOA: 01/28/2022     1 ?DOS: the patient was seen and examined on 01/29/2022 ?  ? ? ?Assessment and Plan: ?* CAP (community acquired pneumonia) ?Continue Rocephin and Zithromax patient with fever and leukocytosis on presentation.  White blood cell count trending down to 10.6 ? ?Dysarthria ?Patient with intermittent slurred speech.  Patient with repeating herself and feeling weak and unsteady with gait.  MRI of the brain negative for acute stroke.  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  Case discussed with neurology and this is not the cause of her symptoms. ? ?Hypokalemia ?Potassium 3.1 on presentation and up to 3.5. ? ?AKI (acute kidney injury) ([place]) ?Creatinine improved from 1.11 down to 0.79. ? ? ?Adverse drug reaction ?Likely adverse drug reaction to hepatitis B vaccine.  Would recommend not getting further hepatitis B vaccinations. ? ?Essential hypertension ?Holding losartan HCT.  Blood pressure stable currently. ? ? ? ? ?  ? ?Subjective: Patient states that she had her second hepatitis B vaccine on Thursday and then started having some symptoms on Friday.  Not feeling well.  She states that she could not do anything.  She has been having trouble talking and she has been repeating herself.  Patient was admitted with pneumonia.  Feels a little bit better today than yesterday.  Still has intermittent trouble talking.  Still repeating herself. ? ?Physical Exam: ?Vitals:  ? 01/28/22 2354 01/29/22 0500 01/29/22 0926 01/29/22 1208  ?BP: 113/67 102/65 130/83 121/75  ?Pulse: 72 66 66 66  ?Resp: '18 18 18 16  '$ ?Temp: 98.4 ?F (36.9 ?C) 98.3 ?F (36.8 ?C) 97.9 ?F (36.6 ?C) 97.8 ?F (36.6 ?C)  ?TempSrc:   Oral   ?SpO2: 97% 95% 100% 100%  ?Weight:      ?Height:      ? ?Physical Exam ?HENT:  ?   Head: Normocephalic.  ?   Mouth/Throat:  ?   Pharynx: No oropharyngeal exudate.  ?Eyes:  ?    General: Lids are normal.  ?   Conjunctiva/sclera: Conjunctivae normal.  ?Cardiovascular:  ?   Rate and Rhythm: Normal rate and regular rhythm.  ?   Heart sounds: Normal heart sounds, S1 normal and S2 normal.  ?Pulmonary:  ?   Breath sounds: No decreased breath sounds, wheezing, rhonchi or rales.  ?Abdominal:  ?   Palpations: Abdomen is soft.  ?   Tenderness: There is no abdominal tenderness.  ?Musculoskeletal:  ?   Right lower leg: Swelling present.  ?   Left lower leg: Swelling present.  ?Skin: ?   General: Skin is warm.  ?   Findings: No rash.  ?Neurological:  ?   Mental Status: She is alert and oriented to person, place, and time.  ?   Comments: Intermittent slurred speech.  Power 5 out of 5 upper and lower extremities.  ?  ?Data Reviewed: ?White blood cell count came down from 18.1-10.6.  Hemoglobin 11.6, procalcitonin 0.37, creatinine came down from 1.11 down to 0.79 ? ?Family Communication: Tried to reach the patient's mother but the phone number in the computer is not working ? ?Disposition: ?Status is: Inpatient ?Remains inpatient appropriate because: Having neurological symptoms with pneumonia ? ?Planned Discharge Destination: Home ? ? ?Author: ?Loletha Grayer, MD ?01/29/2022 4:24 PM ? ?For on call review [email or web address].  ?

## 2022-01-29 NOTE — Assessment & Plan Note (Addendum)
Patient with intermittent slurred speech.  Patient with repeating herself and feeling weak and unsteady with gait.  MRI of the brain negative for acute stroke.  MRI of the brain commented on cerebellar tonsillar ectopia which is not the cause of her symptoms.  Speech therapy worked with the patient and will continue working with patient as outpatient.  This is not a stroke or TIA, so we did not prescribe aspirin.  Acetylcholine antibodies ordered.  Neurological follow-up as outpatient. ?

## 2022-01-29 NOTE — Evaluation (Addendum)
Speech Language Pathology Evaluation ?Patient Details ?Name: Jill Davidson ?MRN: [id number] ?DOB: 1971/05/14 ?Today's Date: 01/29/2022 ?Time: 2536-6440 ?SLP Time Calculation (min) (ACUTE ONLY): 35 min ? ?Problem List:  ?Patient Active Problem List  ? Diagnosis Date Noted  ? CAP (community acquired pneumonia) 01/28/2022  ? Morbid obesity ([place]) 09/05/2020  ? Tobacco user 07/24/2018  ? 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 Vasomotor symptoms due to menopause 05/06/2018  ? S/P laparoscopic assisted vaginal hysterectomy (LAVH) bilateral salpingectomy right oophorectomy 03/24/2018  ? Essential hypertension 07/25/2016  ? Status post endometrial ablation 07/23/2016  ? IDA (iron deficiency anemia) 01/25/2015  ? ?Past Medical History:  ?Past Medical History:  ?Diagnosis Date  ? AR (allergic rhinitis)   ? Bacterial vaginosis   ? Complication of anesthesia 2008  ? PT STATES THAT DURING C-SECTION SHE WAS ADMINISTERED A GAS THAT CAUSED HER HEART TO BEAT IRREGULAR- PT HAS HAD NO PROBLEMS WITH THIS SINCE 2008  ? Dysrhythmia   ? DUE TO A GAS THAT WAS ADMINISTERED DURING A 2008 C-SECTION-NO PROBLEMS SINCE  ? Fibroid   ? GERD (gastroesophageal reflux disease)   ? OCC  ? Hypertension   ? IDA (iron deficiency anemia) 01/25/2015  ? UTI (lower urinary tract infection)   ? ?Past Surgical History:  ?Past Surgical History:  ?Procedure Laterality Date  ? CESAREAN SECTION    ? X2  ? DILATION AND CURETTAGE OF UTERUS    ? DILITATION & CURRETTAGE/HYSTROSCOPY WITH NOVASURE ABLATION N/A 07/23/2016  ? Procedure: DILATATION & CURETTAGE/HYSTEROSCOPY WITH NOVASURE ABLATION;  Surgeon: Brayton Mars, MD;  Location: [facility] ORS;  Service: Gynecology;  Laterality: N/A;  ? LAPAROSCOPIC VAGINAL HYSTERECTOMY WITH SALPINGECTOMY Bilateral 03/24/2018  ? Procedure: LAPAROSCOPIC ASSISTED VAGINAL HYSTERECTOMY WITH BILATERAL SALPINGECTOMY;  Surgeon: Brayton Mars, MD;   Location: [facility] ORS;  Service: Gynecology;  Laterality: Bilateral;  ? ?HPI:  ?Pt  is a 51 y.o. female with medical history significant of tobacco use,  HTN, GERD, hypothyroidism, IDA, recent hepatitis B vaccine on 5/11, also see Social History in H&P who presents to ed with 2 days of generalized weakness, presyncope as well as mild sob. Patient notes that symptoms started day after obtaining Hepatitis B vaccine. She notes no n/v/d/dysuria/ chest pain,  sore throat or sick contacts. She note minimal non-productive cough however. She noted the main symptom however is that of fatigue and generalized weakness.  CT of Chest: "confluent hypoenhancing ground-glass and consolidative opacity  involving the majority of the superior and posterior aspect of the  RIGHT lower lobe with mild bronchial wall thickening noted  throughout the RIGHT lower lobe. There is a peripheral nodule within  the RIGHT middle lobe which measures 5 mm.".  ? ?Assessment / Plan / Recommendation ?Clinical Impression ? Pt appears to present w/ mild Dysarthria impacting her verbal communication at the word-conversation levels; intelligibility is 90-100%. When pt utilized strategies of slowing rate of speech, emphasizing speech sounds, and supporting words w/ full breath support (getting Louder), pt's mild Dysarthria lessened and articulation of speech improved. Education and practice of articulation strategies was completed w/ pt w/ focus on over-articulation. Pt tended to speak softly but noted when she emphasized words, intelligibility improved. Pt presented no gross oral motor weakness during OM tasks. No apparent expressive or receptive aphasia noted, no cognitive deficits noted. Discussed strategies w/ pt; gave Handouts. Recommend pt f/u w/ Outpatient skilled ST servcies if Dysarthria does not  improve post discharge home in order to improve verbal communication in ADLs and at her employment. NSG/MD updated. Pt agreed. ?   ?SLP Assessment ? SLP  Recommendation/Assessment: All further Speech Lanaguage Pathology  needs can be addressed in the next venue of care ?SLP Visit Diagnosis: Dysarthria and anarthria (R47.1)  ?  ?Recommendations for follow up therapy are one component of a multi-disciplinary discharge planning process, led by the attending physician.  Recommendations may be updated based on patient status, additional functional criteria and insurance authorization. ?   ?Follow Up Recommendations ? Follow physician's recommendations for discharge plan and follow up therapies  ?  ?Assistance Recommended at Discharge ? None  ?Functional Status Assessment Patient has had a recent decline in their functional status and demonstrates the ability to make significant improvements in function in a reasonable and predictable amount of time.  ?Frequency and Duration  (n/a)  ? (n/a) ?  ?   ?SLP Evaluation ?Cognition ? Overall Cognitive Status: Within Functional Limits for tasks assessed ?Arousal/Alertness: Awake/alert ?Orientation Level: Oriented X4 ?Year: 2023 ?Month: May ?Day of Week: Correct ?Attention: Focused;Sustained ?Focused Attention: Appears intact ?Sustained Attention: Appears intact ?Memory: Appears intact ?Awareness: Appears intact ?Problem Solving: Appears intact ?Executive Function: Reasoning ?Reasoning: Appears intact ?Behaviors:  (none) ?Safety/Judgment: Appears intact  ?  ?   ?Comprehension ? Auditory Comprehension ?Overall Auditory Comprehension: Appears within functional limits for tasks assessed ?Visual Recognition/Discrimination ?Discrimination: Not tested ?Reading Comprehension ?Reading Status: Within funtional limits (menu)  ?  ?Expression Expression ?Primary Mode of Expression: Verbal ?Verbal Expression ?Overall Verbal Expression: Appears within functional limits for tasks assessed ?Written Expression ?Dominant Hand: Right ?Written Expression: Not tested   ?Oral / Motor ? Motor Speech ?Overall Motor Speech: Impaired ?Respiration: Within  functional limits ?Phonation: Normal ?Resonance: Within functional limits ?Articulation: Impaired ?Level of Impairment: Word ?Intelligibility: Intelligibility reduced ?Word: 75-100% accurate ?Phrase: 75-100% accurate ?Sentence: 75-100% accurate ?Conversation: 75-100% accurate ?Motor Planning: Witnin functional limits ?Motor Speech Errors: Not applicable ?Effective Techniques: Slow rate;Increased vocal intensity;Over-articulate   ?        ? ? ? ? ?Orinda Kenner, MS, CCC-SLP ?Speech Language Pathologist ?[facility]; [place] ?[phone] (ascom) ?Jill Davidson ?01/29/2022, 4:03 PM ? ?

## 2022-01-29 NOTE — Assessment & Plan Note (Signed)
Creatinine improved from 1.11 down to 0.79. ? ?

## 2022-01-29 NOTE — Plan of Care (Signed)

## 2022-01-29 NOTE — Assessment & Plan Note (Signed)
Holding losartan HCT.  Blood pressure stable currently. ?

## 2022-01-29 NOTE — Progress Notes (Addendum)
Occupational Therapy * Physical Therapy * Speech Therapy ?       ? ? ?DATE __15 May 2023 ?PATIENT NAME_[name] ?PATIENT ION__[id number] ? ?DIAGNOSIS/DIAGNOSIS CODE _____Community Acquired pneumonia J18.9_________________ ?DATE OF DISCHARGE: ______________ ? ?PRIMARY CARE PHYSICIAN ______Sowles, Drue Stager, MD ?PCP PHONE/FAX_____ ?[phone]   ?  [phone]  ?______________________ ? ?  ? ?Dear Provider (Name: __________________   ?Fax: ___________________________): ?  ?I certify that I have examined this patient and that occupational/physical/speech therapy is necessary on an outpatient basis.   ? ?The patient has expressed interest in completing their recommended course of therapy at your location.  Once a formal order from the patient's primary care physician has been obtained, please contact him/her to schedule an appointment for evaluation at your earliest convenience. ? ? ?[ x ]  Physical Therapy Evaluate and Treat ? ?        [  ]  Occupational Therapy Evaluate and Treat ? ?              [ x ]  Speech Therapy Evaluate and Treat ? ? ? ? ? ? ?The patient's primary care physician (listed above) must furnish and be responsible for a formal order such that the recommended services may be furnished while under the primary physician's care, and that the plan of care will be established and reviewed every 30 days (or more often if condition necessitates).  ?

## 2022-01-29 NOTE — TOC Progression Note (Signed)
Transition of Care (TOC) - Progression Note  ? ? ?Patient Details  ?Name: Jill Davidson ?MRN: [id number] ?Date of Birth: 1970/10/20 ? ?Transition of Care (TOC) CM/SW Contact  ?Pete Pelt, RN ?Phone Number: ?01/29/2022, 4:24 PM ? ?Clinical Narrative:   Physical Therapy recommends outpatient PT.  Outpatient PT order sent to hospitalist for signature.  No OT follow up per OT notes. ? ? ? ?  ?  ? ?Expected Discharge Plan and Services ?  ?  ?  ?  ?  ?                ?  ?  ?  ?  ?  ?  ?  ?  ?  ?  ? ? ?Social Determinants of Health (SDOH) Interventions ?  ? ?Readmission Risk Interventions ?   ? View : No data to display.  ?  ?  ?  ? ? ?

## 2022-01-29 NOTE — Assessment & Plan Note (Addendum)
Received 3 days of Rocephin and Zithromax here in the hospital.  2 more days of Zithromax and Omnicef upon discharge.  Patient with fever and leukocytosis on presentation.  Patient's white count normalized from 18.1 down to 8.7. ?

## 2022-01-29 NOTE — Assessment & Plan Note (Signed)
Potassium 3.1 on presentation and up to 3.5. ?

## 2022-01-29 NOTE — Evaluation (Signed)
Physical Therapy Evaluation ?Patient Details ?Name: Jill Davidson ?MRN: [id number] ?DOB: [birth year] ?Today's Date: 01/29/2022 ? ?History of Present Illness ? Pt is a 51 y/o F admitted on 01/28/22 after presenting with c/o 2 days of generalized weakness, presyncope, & mild SOB. Pt is being treated for CAP presumed bacterial with associated sepsis. PMH: HTN, GERD, hypothyroidism, IDA  ?Clinical Impression ? Pt seen for PT evaluation with pt received in bed & agreeable to tx. Pt reports prior to admission she was independent without AD, working as a Geophysicist/field seismologist for [employer]. On this date, pt is able to complete STS with supervision & ambulates 1 lap around unit without AD with CGA<>min assist as pt experiences 1-2 LOB to R once back in room. Pt also endorses feeling weak. Will continue to follow pt acutely to address high level balance, endurance, and strengthening. ?    ? ?Recommendations for follow up therapy are one component of a multi-disciplinary discharge planning process, led by the attending physician.  Recommendations may be updated based on patient status, additional functional criteria and insurance authorization. ? ?Follow Up Recommendations Outpatient PT ? ?  ?Assistance Recommended at Discharge Intermittent Supervision/Assistance  ?Patient can return home with the following ? A little help with walking and/or transfers;A little help with bathing/dressing/bathroom;Assistance with cooking/housework;Assist for transportation;Help with stairs or ramp for entrance;Direct supervision/assist for medications management ? ?  ?Equipment Recommendations None recommended by PT  ?Recommendations for Other Services ?    ?  ?Functional Status Assessment Patient has had a recent decline in their functional status and demonstrates the ability to make significant improvements in function in a reasonable and predictable amount of time.  ? ?  ?Precautions / Restrictions Precautions ?Precautions: Fall ?Restrictions ?Weight Bearing  Restrictions: No  ? ?  ? ?Mobility ? Bed Mobility ?Overal bed mobility: Modified Independent ?  ?  ?  ?  ?  ?  ?General bed mobility comments: supine>sit with use of bed rails PRN ?  ? ?Transfers ?Overall transfer level: Needs assistance ?Equipment used: None ?Transfers: Sit to/from Stand ?Sit to Stand: Supervision ?  ?  ?  ?  ?  ?  ?  ? ?Ambulation/Gait ?Ambulation/Gait assistance: Min guard, Min assist ?Gait Distance (Feet): 200 Feet ?Assistive device: None ?  ?Gait velocity: slightly decreased ?  ?  ?General Gait Details: 1-2 LOB to R when entering room with min assist to correct LOB ? ?Stairs ?  ?  ?  ?  ?  ? ?Wheelchair Mobility ?  ? ?Modified Rankin (Stroke Patients Only) ?  ? ?  ? ?Balance Overall balance assessment: Needs assistance ?Sitting-balance support: Feet supported, No upper extremity supported ?Sitting balance-Leahy Scale: Good ?  ?  ?Standing balance support: No upper extremity supported, During functional activity ?Standing balance-Leahy Scale: Poor ?  ?  ?  ?  ?  ?  ?  ?  ?  ?  ?  ?  ?   ? ? ? ?Pertinent Vitals/Pain Pain Assessment ?Pain Assessment: No/denies pain  ? ? ?Home Living Family/patient expects to be discharged to:: Private residence ?Living Arrangements: Parent ?Available Help at Discharge: Family;Available 24 hours/day ?Type of Home: House (condo) ?Home Access: Stairs to enter ?Entrance Stairs-Rails: Right;Left;Can reach both ?Entrance Stairs-Number of Steps: 3 ?  ?Home Layout: One level ?  ?   ?  ?Prior Function Prior Level of Function : Independent/Modified Independent;Driving;Working/employed ?  ?  ?  ?  ?  ?  ?Mobility Comments: works as a Geophysicist/field seismologist  for [employer], denies hx of falls except experienced 3 in the night prior to admission ?  ?  ? ? ?Hand Dominance  ? Dominant Hand: Right ? ?  ?Extremity/Trunk Assessment  ? Upper Extremity Assessment ?Upper Extremity Assessment: Overall WFL for tasks assessed ?  ? ?Lower Extremity Assessment ?Lower Extremity Assessment: Overall WFL for tasks  assessed ?  ? ?   ?Communication  ? Communication:  (slight dysarthria)  ?Cognition Arousal/Alertness: Awake/alert ?Behavior During Therapy: [facility] for tasks assessed/performed ?Overall Cognitive Status: Within Functional Limits for tasks assessed ?  ?  ?  ?  ?  ?  ?  ?  ?  ?  ?  ?  ?  ?  ?  ?  ?  ?  ?  ? ?  ?General Comments   ? ?  ?Exercises    ? ?Assessment/Plan  ?  ?PT Assessment Patient needs continued PT services  ?PT Problem List Decreased mobility;Decreased activity tolerance;Cardiopulmonary status limiting activity;Decreased balance ? ?   ?  ?PT Treatment Interventions DME instruction;Therapeutic activities;Modalities;Gait training;Therapeutic exercise;Patient/family education;Stair training;Balance training;Functional mobility training;Manual techniques;Neuromuscular re-education   ? ?PT Goals (Current goals can be found in the Care Plan section)  ?Acute Rehab PT Goals ?Patient Stated Goal: get better ?PT Goal Formulation: With patient ?Time For Goal Achievement: 02/12/22 ?Potential to Achieve Goals: Good ? ?  ?Frequency 7X/week ?  ? ? ?Co-evaluation   ?  ?  ?  ?  ? ? ?  ?AM-PAC PT "6 Clicks" Mobility  ?Outcome Measure Help needed turning from your back to your side while in a flat bed without using bedrails?: None ?Help needed moving from lying on your back to sitting on the side of a flat bed without using bedrails?: None ?Help needed moving to and from a bed to a chair (including a wheelchair)?: A Little ?Help needed standing up from a chair using your arms (e.g., wheelchair or bedside chair)?: A Little ?Help needed to walk in hospital room?: A Little ?Help needed climbing 3-5 steps with a railing? : A Little ?6 Click Score: 20 ? ?  ?End of Session   ?Activity Tolerance: Patient tolerated treatment well ?Patient left: in chair;with call bell/phone within reach ?Nurse Communication: Mobility status ?PT Visit Diagnosis: Unsteadiness on feet (R26.81);Muscle weakness (generalized) (M62.81) ?  ? ?Time:  0102-7253 ?PT Time Calculation (min) (ACUTE ONLY): 16 min ? ? ?Charges:   PT Evaluation ?$PT Eval Low Complexity: 1 Low ?  ?  ?   ? ? ?Lavone Nian, PT, DPT ?01/29/22, 3:58 PM ? ? ?Waunita Schooner ?01/29/2022, 3:56 PM ? ?

## 2022-01-29 NOTE — Progress Notes (Signed)
Patient alert and oriented x4,  refused bed alarms at this time, patient educated about safety, will continue to monitor. ?

## 2022-01-29 NOTE — Assessment & Plan Note (Signed)
Likely adverse drug reaction to hepatitis B vaccine.  Would recommend not getting further hepatitis B vaccinations. ?

## 2022-01-29 NOTE — Progress Notes (Signed)
[place] responded to patient's request for AD info. Patient was lethargic throughout visit. Shared AD booklet with patient and informed that it can be completed during business hours and a chaplain will assist in completing form later.  ? ?Rev. Evelena Asa, M.Div. ?Healthcare Chaplain ?

## 2022-01-29 NOTE — Evaluation (Signed)
Occupational Therapy Evaluation ?Patient Details ?Name: Jill Davidson ?MRN: [id number] ?DOB: May 31, 1971 ?Today's Date: 01/29/2022 ? ? ?History of Present Illness Pt is a 51 y/o F admitted on 01/28/22 after presenting with c/o 2 days of generalized weakness, presyncope, & mild SOB. Pt is being treated for CAP presumed bacterial with associated sepsis. PMH: HTN, GERD, hypothyroidism, IDA  ? ?Clinical Impression ?  ?Jill Davidson was seen for OT evaluation this date. Prior to hospital admission, pt was Independent including driving. Pt presents to acute OT demonstrating near baseline for ADL performance and functional mobility. Pt currently requires SUPERVISION standing grooming task and functional reaching task placing obkects at ankle height cabinet. MOD I for LB access in sitting. All education complete, will sign off. Upon hospital discharge, recommend no OT follow up.   ? ?Recommendations for follow up therapy are one component of a multi-disciplinary discharge planning process, led by the attending physician.  Recommendations may be updated based on patient status, additional functional criteria and insurance authorization.  ? ?Follow Up Recommendations ? No OT follow up  ?  ?Assistance Recommended at Discharge Set up Supervision/Assistance  ?   ?Functional Status Assessment ? Patient has had a recent decline in their functional status and demonstrates the ability to make significant improvements in function in a reasonable and predictable amount of time.  ?Equipment Recommendations ? BSC/3in1  ?  ?Recommendations for Other Services   ? ? ?  ?Precautions / Restrictions Precautions ?Precautions: Fall ?Restrictions ?Weight Bearing Restrictions: No  ? ?  ? ?Mobility Bed Mobility ?  ?  ?  ?  ?  ?  ?  ?General bed mobility comments: received and left sitting ?  ? ?Transfers ?Overall transfer level: Needs assistance ?Equipment used: None ?Transfers: Sit to/from Stand ?Sit to Stand: Supervision ?  ?  ?  ?  ?  ?General transfer  comment: reaches out for intermittent single UE support ?  ? ?  ?Balance Overall balance assessment: Needs assistance ?Sitting-balance support: Feet supported, No upper extremity supported ?Sitting balance-Leahy Scale: Good ?  ?  ?Standing balance support: No upper extremity supported, During functional activity ?Standing balance-Leahy Scale: Fair ?  ?  ?  ?  ?  ?  ?  ?  ?  ?  ?  ?  ?   ? ?ADL either performed or assessed with clinical judgement  ? ?ADL Overall ADL's : Needs assistance/impaired ?  ?  ?  ?  ?  ?  ?  ?  ?  ?  ?  ?  ?  ?  ?  ?  ?  ?  ?  ?General ADL Comments: SUPERVISION standing grooming task and functional reaching task placing obkects at ankle height cabinet. MOD I for LB access in sitting  ? ? ? ? ?Pertinent Vitals/Pain Pain Assessment ?Pain Assessment: No/denies pain  ? ? ? ?Hand Dominance Right ?  ?Extremity/Trunk Assessment Upper Extremity Assessment ?Upper Extremity Assessment: Overall WFL for tasks assessed ?  ?Lower Extremity Assessment ?Lower Extremity Assessment: Overall WFL for tasks assessed ?  ?  ?  ?Communication Communication ?Communication:  (slight dysarthria) ?  ?Cognition Arousal/Alertness: Awake/alert ?Behavior During Therapy: [facility] for tasks assessed/performed ?Overall Cognitive Status: Within Functional Limits for tasks assessed ?  ?  ?  ?  ?  ?  ?  ?  ?  ?  ?  ?  ?  ?  ?  ?  ?  ?  ?  ? ?Home Living Family/patient expects  to be discharged to:: Private residence ?Living Arrangements: Parent ?Available Help at Discharge: Family;Available 24 hours/day ?Type of Home: House (condo) ?Home Access: Stairs to enter ?Entrance Stairs-Number of Steps: 3 ?Entrance Stairs-Rails: Right;Left;Can reach both ?Home Layout: One level ?  ?  ?  ?  ?  ?  ?  ?  ?  ?  ?  ? ?  ?Prior Functioning/Environment Prior Level of Function : Independent/Modified Independent;Driving;Working/employed ?  ?  ?  ?  ?  ?  ?Mobility Comments: works as a Geophysicist/field seismologist for [employer], denies hx of falls except experienced 3 in the night  prior to admission ?  ?  ? ?  ?  ?OT Problem List: Decreased activity tolerance;Impaired balance (sitting and/or standing) ?  ?   ?   ?OT Goals(Current goals can be found in the care plan section) Acute Rehab OT Goals ?Patient Stated Goal: to go home ?OT Goal Formulation: With patient ?Time For Goal Achievement: 02/12/22 ?Potential to Achieve Goals: Good  ? ?AM-PAC OT "6 Clicks" Daily Activity     ?Outcome Measure Help from another person eating meals?: None ?Help from another person taking care of personal grooming?: A Little ?Help from another person toileting, which includes using toliet, bedpan, or urinal?: None ?Help from another person bathing (including washing, rinsing, drying)?: A Little ?Help from another person to put on and taking off regular upper body clothing?: None ?Help from another person to put on and taking off regular lower body clothing?: None ?6 Click Score: 22 ?  ?End of Session   ? ?Activity Tolerance: Patient tolerated treatment well ?Patient left: in chair;with call bell/phone within reach ? ?OT Visit Diagnosis: Unsteadiness on feet (R26.81)  ?              ?Time: 6387-5643 ?OT Time Calculation (min): 15 min ?Charges:  OT General Charges ?$OT Visit: 1 Visit ?OT Evaluation ?$OT Eval Low Complexity: 1 Low ? ?Dessie Coma, M.S. OTR/L  ?01/29/22, 4:21 PM  ?ascom [phone] ? ?

## 2022-01-30 ENCOUNTER — Inpatient Hospital Stay: Payer: Medicaid Other

## 2022-01-30 ENCOUNTER — Inpatient Hospital Stay
Admit: 2022-01-30 | Discharge: 2022-01-30 | Disposition: A | Discharge status: Home / Self Care | Payer: Medicaid Other | Attending: Internal Medicine | Admitting: Internal Medicine

## 2022-01-30 DIAGNOSIS — T50905S Adverse effect of unspecified drugs, medicaments and biological substances, sequela: Secondary | ICD-10-CM

## 2022-01-30 LAB — ECHOCARDIOGRAM COMPLETE
AR max vel: 2.4 cm2
AV Area VTI: 2.25 cm2
AV Area mean vel: 2.54 cm2
AV Mean grad: 3 mmHg
AV Peak grad: 5.6 mmHg
Ao pk vel: 1.18 m/s
Area-P 1/2: 3.66 cm2
Height: 63 in
MV VTI: 1.32 cm2
S' Lateral: 3.28 cm
Weight: 3520 oz

## 2022-01-30 LAB — CBC
HCT: 33.1 % — ABNORMAL LOW (ref 36.0–46.0)
Hemoglobin: 11.1 g/dL — ABNORMAL LOW (ref 12.0–15.0)
MCH: 29.3 pg (ref 26.0–34.0)
MCHC: 33.5 g/dL (ref 30.0–36.0)
MCV: 87.3 fL (ref 80.0–100.0)
Platelets: 234 10*3/uL (ref 150–400)
RBC: 3.79 MIL/uL — ABNORMAL LOW (ref 3.87–5.11)
RDW: 13.6 % (ref 11.5–15.5)
WBC: 8.7 10*3/uL (ref 4.0–10.5)
nRBC: 0 % (ref 0.0–0.2)

## 2022-01-30 LAB — URINE CULTURE: Culture: <10000 — AB

## 2022-01-30 LAB — LEGIONELLA PNEUMOPHILA SEROGP 1 UR AG: L. pneumophila Serogp 1 Ur Ag: NEGATIVE

## 2022-01-30 LAB — LIPID PANEL
Cholesterol: 134 mg/dL (ref 0–200)
HDL: <10 mg/dL — ABNORMAL LOW (ref >40)
Triglycerides: 226 mg/dL — ABNORMAL HIGH (ref <150)
VLDL: 45 mg/dL — ABNORMAL HIGH (ref 0–40)

## 2022-01-30 LAB — PROCALCITONIN: Procalcitonin: 0.17 ng/mL

## 2022-01-30 MED ORDER — AZITHROMYCIN 250 MG PO TABS: ORAL_TABLET | ORAL | 0 refills | Status: DC

## 2022-01-30 MED ORDER — CEFDINIR 300 MG PO CAPS: 300.0000 mg | ORAL_CAPSULE | Freq: Two times a day (BID) | ORAL | 0 refills | Status: AC

## 2022-01-30 MED ORDER — IOHEXOL 350 MG/ML SOLN
75.0000 mL | Freq: Once | INTRAVENOUS | Status: AC | PRN
  Administered 2022-01-30: 75 mL via INTRAVENOUS

## 2022-01-30 NOTE — TOC Progression Note (Signed)
Transition of Care (TOC) - Progression Note  ? ? ?Patient Details  ?Name: Jill Davidson ?MRN: [id number] ?Date of Birth: 11-17-1970 ? ?Transition of Care (TOC) CM/SW Contact  ?Haralambos Yeatts A Afsheen Antony, LCSW ?Phone Number: ?01/30/2022, 9:04 AM ? ?Clinical Narrative:  outpatient referral faxed to [facility] location ? ? ? ?  ?  ? ?Expected Discharge Plan and Services ?  ?  ?  ?  ?  ?                ?  ?  ?  ?  ?  ?  ?  ?  ?  ?  ? ? ?Social Determinants of Health (SDOH) Interventions ?  ? ?Readmission Risk Interventions ?   ? View : No data to display.  ?  ?  ?  ? ? ?

## 2022-01-30 NOTE — Discharge Summary (Signed)
?Physician Discharge Summary ?  ?Patient: Jill Davidson MRN: [id number] DOB: December 24, 1970  ?Admit date:     01/28/2022  ?Discharge date: 01/30/22  ?Discharge Physician: Loletha Grayer  ? ?PCP: Steele Sizer, MD  ? ?Recommendations at discharge:  ? ?Follow-up PCP 5 days ?Follow-up [facility] 3 to 4 weeks ? ?Discharge Diagnoses: ?Principal Problem: ?  CAP (community acquired pneumonia) ?Active Problems: ?  Dysarthria ?  Essential hypertension ?  Adverse drug reaction ?  AKI (acute kidney injury) ([place]) ?  Hypokalemia ? ? ? ?Hospital Course: ?The patient was admitted to the hospital on 01/28/2022 and discharged on 01/30/2022.  The patient came in with weakness and slurred speech.  Patient was found to have pneumonia and given IV antibiotics.  Patient received 3 days of antibiotics during the hospital course and will be given 2 more days of Omnicef and Zithromax upon discharge. ? ?The patient continued to have slurred speech.  She was seen by speech pathology.  Stroke work-up was negative.  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  Outpatient PT and speech therapy recommended.  Since slurred speech did not get totally better with antibiotic treatment for pneumonia I did have neurology do a consultation.  Acetylcholine antibody sent off.  This is not a stroke or TIA so no aspirin or cholesterol medication needed. ? ?Assessment and Plan: ?* CAP (community acquired pneumonia) ?Received 3 days of Rocephin and Zithromax here in the hospital.  2 more days of Zithromax and Omnicef upon discharge.  Patient with fever and leukocytosis on presentation.  Patient's white count normalized from 18.1 down to 8.7. ? ?Dysarthria ?Patient with intermittent slurred speech.  Patient with repeating herself and feeling weak and unsteady with gait.  MRI of the brain negative for acute stroke.  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  Speech therapy worked with the patient and will continue working with patient as outpatient.  This is not a stroke or TIA, so we did not prescribe aspirin.  Acetylcholine antibodies ordered.  Neurological follow-up as outpatient. ? ?Hypokalemia ?Potassium 3.1 on presentation and up to 3.5. ? ?AKI (acute kidney injury) ([place]) ?Creatinine improved from 1.11 down to 0.79. ? ? ?Adverse drug reaction ?Likely adverse drug reaction to hepatitis B vaccine.  Would recommend not getting further hepatitis B vaccinations. ? ?Essential hypertension ?Holding losartan HCT.  Blood pressure stable currently. ? ? ? ? ?  ? ? ?Consultants: Neurology ?Procedures performed: None ?Disposition: Home ?Diet recommendation:  ?Cardiac diet ?DISCHARGE MEDICATION: ?Allergies as of 01/30/2022   ?No Known Allergies ?  ? ?  ?Medication List  ?  ? ?STOP taking these medications   ? ?acetic acid-hydrocortisone OTIC solution ?Commonly known as: VOSOL-HC ?  ?losartan-hydrochlorothiazide 100-12.5 MG tablet ?Commonly known as: HYZAAR ?  ?ondansetron 4 MG tablet ?Commonly known as: Zofran ?  ?SUMAtriptan 100 MG tablet ?Commonly known as: IMITREX ?  ?traZODone 50 MG tablet ?Commonly known as: DESYREL ?  ?Ubrelvy 100 MG Tabs ?Generic drug: Ubrogepant ?  ? ?  ? ?TAKE these medications   ? ?acetaminophen 325 MG tablet ?Commonly known as: TYLENOL ?Take 650 mg by mouth every 6 (six) hours as needed. ?  ?azithromycin 250 MG tablet ?Commonly known as: Zithromax ?One tab po daily for two days ?  ?cefdinir 300 MG capsule ?Commonly known as: OMNICEF ?Take 1 capsule (300 mg total) by  mouth 2 (two) times daily for 2 days. ?  ?fluticasone 50 MCG/ACT nasal spray ?Commonly known as: FLONASE ?Place 2 sprays into both nostrils daily. ?  ? ?  ? ? Follow-up Information   ? ? Steele Sizer, MD Follow up in 5 day(s).   ?Specialty: Family Medicine ?Contact information: ?[address] ?[address] ?[place] [address] ?[phone] ? ? ?  ?  ? ?  [facility] Follow up in 3 week(s).   ?Why: slurred speach follow up ?Contact information: ?[address] [address] ?[phone] ? ?  ?  ? ?  ?  ? ?  ? ?Discharge Exam: ?Danley Danker Weights  ? 01/28/22 0913  ?Weight: 99.8 kg  ? ?Physical Exam ?HENT:  ?   Head: Normocephalic.  ?   Mouth/Throat:  ?   Pharynx: No oropharyngeal exudate.  ?Eyes:  ?   General: Lids are normal.  ?   Conjunctiva/sclera: Conjunctivae normal.  ?Cardiovascular:  ?   Rate and Rhythm: Normal rate and regular rhythm.  ?   Heart sounds: Normal heart sounds, S1 normal and S2 normal.  ?Pulmonary:  ?   Breath sounds: No decreased breath sounds, wheezing, rhonchi or rales.  ?Abdominal:  ?   Palpations: Abdomen is soft.  ?   Tenderness: There is no abdominal tenderness.  ?Musculoskeletal:  ?   Right lower leg: Swelling present.  ?   Left lower leg: Swelling present.  ?Skin: ?   General: Skin is warm.  ?   Findings: No rash.  ?Neurological:  ?   Mental Status: She is alert and oriented to person, place, and time.  ?   Comments: Intermittent slurred speech.  Power 5 out of 5 upper and lower extremities.  ?  ? ?Condition at discharge: stable ? ?The results of significant diagnostics from this hospitalization (including imaging, microbiology, ancillary and laboratory) are listed below for reference.  ? ?Imaging Studies: ?CT ANGIO HEAD NECK W WO CM ? ?Result Date: 01/30/2022 ?CLINICAL DATA:  Difficulty speaking. EXAM: CT ANGIOGRAPHY HEAD AND NECK TECHNIQUE: Multidetector CT imaging of the head and neck was performed using the standard protocol during bolus administration of intravenous contrast. Multiplanar CT image reconstructions and MIPs were obtained to evaluate the vascular anatomy. Carotid stenosis measurements (when applicable) are obtained utilizing NASCET criteria, using the distal internal carotid diameter as the denominator. RADIATION DOSE REDUCTION: This exam was performed according to  the departmental dose-optimization program which includes automated exposure control, adjustment of the mA and/or kV according to patient size and/or use of iterative reconstruction technique. CONTRAST:  51m OMNIPAQUE IOHEXOL 350 MG/ML SOLN COMPARISON:  Brain MRI dated 1 day prior, CT head dated 2 days prior FINDINGS: CT HEAD FINDINGS Brain: There is no acute intracranial hemorrhage, extra-axial fluid collection, or acute infarct. Parenchymal volume is normal. The ventricles are normal in size. Gray-white differentiation is preserved. There is no mass lesion. There is no mass effect or midline shift. Low-lying cerebellar tonsils are again seen. Vascular: See below. Skull: Normal. Negative for fracture or focal lesion. Sinuses: There is mild mucosal thickening in the paranasal sinuses. Orbits: The globes and orbits are unremarkable. Review of the MIP images confirms the above findings CTA NECK FINDINGS Aortic arch: The imaged aortic arch is normal. The origins of the major branch vessels are patent. The subclavian arteries are patent to the level imaged. Right carotid system: The right common, internal, and external carotid arteries are patent, without  hemodynamically significant stenosis or occlusion. There is no dissection or aneurysm. Left carotid system: The left common, internal, and external carotid arteries are patent, without hemodynamically significant stenosis or occlusion. There is no dissection or aneurysm. Vertebral arteries: The vertebral arteries are patent, without hemodynamically significant stenosis or occlusion. There is no dissection or aneurysm. Skeleton: There is no acute osseous abnormality or suspicious osseous lesion. There is no visible canal hematoma. Other neck: There is a 1.1 cm right thyroid nodule, for which no specific imaging follow-up is required. The soft tissues are otherwise unremarkable. Upper chest: A right pleural effusion is partially imaged. The main pulmonary artery is  enlarged measuring up to 3.1 cm. Review of the MIP images confirms the above findings CTA HEAD FINDINGS Anterior circulation: The intracranial ICAs are patent. The bilateral MCAs are patent. The right A1 segment

## 2022-01-30 NOTE — Progress Notes (Signed)
Physical Therapy Treatment ?Patient Details ?Name: Jill Davidson ?MRN: [id number] ?DOB: Mar 05, 1971 ?Today's Date: 01/30/2022 ? ? ?History of Present Illness Pt is a 51 y/o F admitted on 01/28/22 after presenting with c/o 2 days of generalized weakness, presyncope, & mild SOB. Pt is being treated for CAP presumed bacterial with associated sepsis. PMH: HTN, GERD, hypothyroidism, IDA ? ?  ?PT Comments  ? ? Pt received in Semi-Fowler's position and agreeable to therapy.  Pt requesting IV to be removed prior to therapy.  Nursing assisted with this request and pt continues to move well with supervision during transfers.  Therapist utilized CGA throughout due to imbalance with higher levels tasks.  Pt does experience swaying when ambulating and looking up, but all other directions, down/left/right, she is able to perform with minimal swaying.   Pt then asked cognitive questions and challenged by counting backwards from 100 by 3's and 5's.  Pt unable to do with 3's, but is able to perform slowly with 5's.  Pt then returned to room with all needs met and education provided on OPPT.  Current discharge plans to OPPT remain appropriate at this time.  Pt will continue to benefit from skilled therapy in order to address deficits listed below. ? ? ?  ?Recommendations for follow up therapy are one component of a multi-disciplinary discharge planning process, led by the attending physician.  Recommendations may be updated based on patient status, additional functional criteria and insurance authorization. ? ?Follow Up Recommendations ? Outpatient PT ?  ?  ?Assistance Recommended at Discharge Intermittent Supervision/Assistance  ?Patient can return home with the following A little help with walking and/or transfers;A little help with bathing/dressing/bathroom;Assistance with cooking/housework;Assist for transportation;Help with stairs or ramp for entrance;Direct supervision/assist for medications management ?  ?Equipment  Recommendations ? None recommended by PT  ?  ?Recommendations for Other Services   ? ? ?  ?Precautions / Restrictions Precautions ?Precautions: Fall ?Restrictions ?Weight Bearing Restrictions: No  ?  ? ?Mobility ? Bed Mobility ?Overal bed mobility: Modified Independent ?  ?  ?  ?  ?  ?  ?General bed mobility comments: supine>sit with use of bed rails PRN ?  ? ?Transfers ?Overall transfer level: Needs assistance ?Equipment used: None ?Transfers: Sit to/from Stand ?Sit to Stand: Supervision ?  ?  ?  ?  ?  ?  ?  ? ?Ambulation/Gait ?Ambulation/Gait assistance: Min guard, Min assist ?Gait Distance (Feet): 320 Feet ?Assistive device: None ?  ?Gait velocity: slightly decreased ?  ?  ?General Gait Details: 1-2 LOB to R when entering room with min assist to correct LOB ? ? ?Stairs ?  ?  ?  ?  ?  ? ? ?Wheelchair Mobility ?  ? ?Modified Rankin (Stroke Patients Only) ?  ? ? ?  ?Balance Overall balance assessment: Needs assistance ?Sitting-balance support: Feet supported, No upper extremity supported ?Sitting balance-Leahy Scale: Good ?  ?  ?Standing balance support: No upper extremity supported, During functional activity ?Standing balance-Leahy Scale: Fair ?  ?  ?  ?  ?  ?  ?  ?High level balance activites: Direction changes, Turns, Head turns ?High Level Balance Comments: Pt able to participate and notes looking up to be difficult for her when ambulating. ?  ?  ?  ?  ? ?  ?Cognition Arousal/Alertness: Awake/alert ?Behavior During Therapy: [facility] for tasks assessed/performed ?Overall Cognitive Status: Within Functional Limits for tasks assessed ?  ?  ?  ?  ?  ?  ?  ?  ?  ?  ?  ?  ?  ?  ?  ?  ?  ?  ?  ? ?  ?  Exercises   ? ?  ?General Comments   ?  ?  ? ?Pertinent Vitals/Pain Pain Assessment ?Pain Assessment: No/denies pain  ? ? ?Home Living   ?  ?  ?  ?  ?  ?  ?  ?  ?  ?   ?  ?Prior Function    ?  ?  ?   ? ?PT Goals (current goals can now be found in the care plan section) Acute Rehab PT Goals ?Patient Stated Goal: get better ?PT  Goal Formulation: With patient ?Time For Goal Achievement: 02/12/22 ?Potential to Achieve Goals: Good ?Progress towards PT goals: Progressing toward goals ? ?  ?Frequency ? ? ? 7X/week ? ? ? ?  ?PT Plan Current plan remains appropriate  ? ? ?Co-evaluation   ?  ?  ?  ?  ? ?  ?AM-PAC PT "6 Clicks" Mobility   ?Outcome Measure ? Help needed turning from your back to your side while in a flat bed without using bedrails?: None ?Help needed moving from lying on your back to sitting on the side of a flat bed without using bedrails?: None ?Help needed moving to and from a bed to a chair (including a wheelchair)?: A Little ?Help needed standing up from a chair using your arms (e.g., wheelchair or bedside chair)?: A Little ?Help needed to walk in hospital room?: A Little ?Help needed climbing 3-5 steps with a railing? : A Little ?6 Click Score: 20 ? ?  ?End of Session Equipment Utilized During Treatment: Gait belt ?Activity Tolerance: Patient tolerated treatment well ?Patient left: in chair;with call bell/phone within reach ?Nurse Communication: Mobility status ?PT Visit Diagnosis: Unsteadiness on feet (R26.81);Muscle weakness (generalized) (M62.81) ?  ? ? ?Time: 0814-4818 ?PT Time Calculation (min) (ACUTE ONLY): 12 min ? ?Charges:  $Neuromuscular Re-education: 8-22 mins          ?          ? ?Gwenlyn Saran, PT, DPT ?01/30/22, 3:25 PM ? ? ? ?Christie Nottingham ?01/30/2022, 3:22 PM ? ?

## 2022-01-30 NOTE — Progress Notes (Signed)
Speech Language Pathology Treatment: Dysphagia  ?Patient Details ?Name: Jill Davidson ?MRN: [id number] ?DOB: Oct 11, 1970 ?Today's Date: 01/30/2022 ?Time: 0930-1000 ?SLP Time Calculation (min) (ACUTE ONLY): 30 min ? ?Assessment / Plan / Recommendation ?Clinical Impression ? Pt seen for ongoing education and tx re: Dysarthria. She appears to present w/ mild Dysarthria and decreased volume of speech impacting her verbal communication at the word-conversation levels; intelligibility is 90-100%. When pt utilized strategies of slowing rate of speech, emphasizing speech sounds, and supporting words w/ full breath support (getting Louder), pt is fully intelligible w/ improvement in both articulation and volume of speech. Education and practice of articulation strategies was completed w/ pt w/ focus on over-articulation and loudness using exs of DOW, #s, and menu items(written words). Pt tended to speak softly but noted when she emphasized words, intelligibility improved. Pt presented no gross oral motor weakness during OM tasks.  ?Discussed strategies w/ pt; pt demonstrated w/ teach-back. Gave Handouts for ongoing exs and use at home. Recommend pt f/u w/ Outpatient skilled ST servcies if when following up w/ PCP post d/c home the Dysarthria has not improved in order to improve verbal communication in ADLs and at her employment. NSG/MD updated. Pt agreed. ? ? ?  ?HPI HPI: Pt  is a 51 y.o. female with medical history significant of tobacco use,  HTN, GERD, hypothyroidism, IDA, recent hepatitis B vaccine on 5/11, also see Social History in H&P who presents to ed with 2 days of generalized weakness, presyncope as well as mild sob. Patient notes that symptoms started day after obtaining Hepatitis B vaccine. She notes no n/v/d/dysuria/ chest pain,  sore throat or sick contacts. She note minimal non-productive cough however. She noted the main symptom however is that of fatigue and generalized weakness.  CT of Chest: "confluent  hypoenhancing ground-glass and consolidative opacity  involving the majority of the superior and posterior aspect of the  RIGHT lower lobe with mild bronchial wall thickening noted  throughout the RIGHT lower lobe. There is a peripheral nodule within  the RIGHT middle lobe which measures 5 mm.". ?  ?   ?SLP Plan ? All goals met; education completed ? ?  ?  ?Recommendations for follow up therapy are one component of a multi-disciplinary discharge planning process, led by the attending physician.  Recommendations may be updated based on patient status, additional functional criteria and insurance authorization. ?  ? ?Recommendations  ?Diet recommendations: Regular;Thin liquid (w/ general aspiration precautions)  ?   ?    ?   ? ? ? ? Oral Care Recommendations: Oral care BID;Oral care before and after PO;Patient independent with oral care ?Follow Up Recommendations: Follow physician's recommendations for discharge plan and follow up therapies ?Assistance recommended at discharge: None ?SLP Visit Diagnosis: Dysarthria and anarthria (R47.1) ?Plan: All goals met ? ? ? ? ?  ?  ? ? ? ? ?Orinda Kenner, MS, CCC-SLP ?Speech Language Pathologist ?Rehab [facility]; [place] ?[phone] (ascom) ?Orianna Biskup ? ?01/30/2022, 1:40 PM ?

## 2022-01-30 NOTE — Progress Notes (Signed)
PT Cancellation Note ? ?Patient Details ?Name: [name] ?MRN: [id number] ?DOB: [birth year] ? ? ?Cancelled Treatment:    Reason Eval/Treat Not Completed: Patient at procedure or test/unavailable.  Chart reviewed and attempted to see pt.  Pt actively being transported to CT.  Will re-attempt to see pt at later date/time as medically appropriate. ? ? ?Gwenlyn Saran, PT, DPT ?01/30/22, 10:03 AM ? ?

## 2022-01-30 NOTE — Consult Note (Signed)
Neurology Consultation Reason for Consult: Slurred speech Referring Physician: Leslye Peer, R  CC: Slurred speech  History is obtained from: Patient  HPI: Jill Davidson is a 51 y.o. female who presented with with generalized weakness and was found to have pretty significant pneumonia.  She has had some difficulty with slurred speech while she has been in the hospital, and also notes that she has been slightly unsteady.  This is improving, and she feels almost back to her baseline at this point.  She had an MRI which was negative other than some mild cerebellar tonsil ectopia   ROS: A 14 point ROS was performed and is negative except as noted in the HPI.  Past Medical History:  Diagnosis Date   AR (allergic rhinitis)    Bacterial vaginosis    Complication of anesthesia 2008   PT STATES THAT DURING C-SECTION SHE WAS ADMINISTERED A GAS THAT CAUSED HER HEART TO BEAT IRREGULAR- PT HAS HAD NO PROBLEMS WITH THIS SINCE 2008   Dysrhythmia    DUE TO A GAS THAT WAS ADMINISTERED DURING A 2008 C-SECTION-NO PROBLEMS SINCE   Fibroid    GERD (gastroesophageal reflux disease)    OCC   Hypertension    IDA (iron deficiency anemia) 01/25/2015   UTI (lower urinary tract infection)      Family History  Problem Relation Age of Onset   Diabetes Father    Hypertension Father    Colon cancer Maternal Aunt    Breast cancer Maternal Aunt        great   Heart disease Maternal Aunt    Diabetes Maternal Grandmother    Hypertension Mother    Thyroid disease Mother    Lung cancer Mother    Asthma Mother    Hypertension Brother    Ovarian cancer Neg Hx      Social History:  reports that she has been smoking cigarettes. She started smoking about 29 years ago. She has a 12.50 pack-year smoking history. She has never used smokeless tobacco. She reports current alcohol use. She reports current drug use. Drug: Marijuana.   Exam: Current vital signs: BP (!) 145/87 (BP Location: Left Arm)   Pulse 73   Temp  98 F (36.7 C)   Resp 16   Ht '5\' 3"'$  (1.6 m)   Wt 99.8 kg   LMP 03/21/2018 (Exact Date)   SpO2 100%   BMI 38.97 kg/m  Vital signs in last 24 hours: Temp:  [97.6 F (36.4 C)-98.4 F (36.9 C)] 98 F (36.7 C) (05/16 1555) Pulse Rate:  [63-73] 73 (05/16 1555) Resp:  [14-18] 16 (05/16 1555) BP: (115-145)/(63-91) 145/87 (05/16 1555) SpO2:  [97 %-100 %] 100 % (05/16 1555)   Physical Exam  Constitutional: Appears well-developed and well-nourished.  Psych: Affect appropriate to situation Eyes: No scleral injection HENT: No OP obstruction MSK: no joint deformities.  Cardiovascular: Normal rate and regular rhythm.  Respiratory: Effort normal, non-labored breathing GI: Soft.  No distension. There is no tenderness.  Skin: WDI  Neuro: Mental Status: Patient is awake, alert, oriented to person, place, month, year, and situation. Patient is able to give a clear and coherent history. No signs of aphasia or neglect Cranial Nerves: II: Visual Fields are full. Pupils are equal, round, and reactive to light.   III,IV, VI: EOMI without ptosis or diploplia.  V: Facial sensation is symmetric to temperature VII: Facial movement is symmetric.  VIII: hearing is intact to voice X: Uvula elevates symmetrically XI: Shoulder shrug is  symmetric. XII: tongue is midline without atrophy or fasciculations.  Motor: Tone is normal. Bulk is normal. 5/5 strength was present in all four extremities.  Sensory: Sensation is symmetric to light touch and temperature in the arms and legs. Cerebellar: She is slightly slower on finger-nose-finger on the left without clear ataxia.   I have reviewed labs in epic and the results pertinent to this consultation are: Creatinine 1.1  I have reviewed the images obtained: MRI brain-negative other than borderline cerebellar tonsils  Impression: 51 year old female with a history of fluctuating slurred speech in the setting of pneumonia.  The etiology is slightly  unclear to me, I doubt that her mild cerebellar ectopia is playing a role but her improvement is reassuring to me.  An autoimmune condition might myasthenia gravis could be exacerbated in the setting of infection, and so I think it would be worthwhile to send antibodies, but she does not need to stay in the hospital to have this done.  Recommendations: 1) ACH R antibodies 2) follow-up with outpatient neurology.   Roland Rack, MD [facility] [phone]  If 7pm- 7am, please page neurology on call as listed in [place].

## 2022-01-31 ENCOUNTER — Telehealth: Payer: Self-pay

## 2022-01-31 NOTE — Telephone Encounter (Signed)
Transition Care Management Follow-up Telephone Call ?Date of discharge and from where: 01/30/22 [facility] ?How have you been since you were released from the hospital? Pt states she is doing okay, still weak and speech is still "off"  ?Any questions or concerns? No ? ?Items Reviewed: ?Did the pt receive and understand the discharge instructions provided? Yes  ?Medications obtained and verified? Yes  ?Other? No  ?Any new allergies since your discharge? No  ?Dietary orders reviewed? Yes ?Do you have support at home? Yes  ? ?Home Care and Equipment/Supplies: ?Were home health services ordered? no ?  ?Were any new equipment or medical supplies ordered?  No ? ?Functional Questionnaire: (I = Independent and D = Dependent) ?ADLs: I ? ?Bathing/Dressing- I ? ?Meal Prep- D ? ?Eating- I ? ?Maintaining continence- I ? ?Transferring/Ambulation- I ? ?Managing Meds- I ? ?Follow up appointments reviewed: ? ?PCP Hospital f/u appt confirmed? No  awaiting PCP appt due to provider schedule. ?[facility] f/u appt confirmed? No  awaiting appt from neurology.  ?Are transportation arrangements needed? No  ?If their condition worsens, is the pt aware to call PCP or go to the Emergency Dept.? Yes ?Was the patient provided with contact information for the PCP's office or ED? Yes ?Was to pt encouraged to call back with questions or concerns? Yes  ?

## 2022-01-31 NOTE — Telephone Encounter (Signed)
Copied from [place] [id number]. Topic: Appointment Scheduling - Scheduling Inquiry for Clinic >> Jan 31, 2022 11:45 AM Yvette Rack wrote: Reason for CRM: Pt stated she was discharged from the hospital yesterday and requests hospital follow up appt with Dr. Ancil Boozer. There are no hospital fu appts available with Dr. Ancil Boozer within the next 2 weeks. Please contact pt to advise.

## 2022-02-01 ENCOUNTER — Encounter: Payer: Self-pay | Admitting: Hematology and Oncology

## 2022-02-01 LAB — ACETYLCHOLINE RECEPTOR, BINDING: Acety choline binding ab: <0.03 nmol/L (ref 0.00–0.24)

## 2022-02-02 LAB — CULTURE, BLOOD (ROUTINE X 2)
Culture: NO GROWTH
Culture: NO GROWTH
Special Requests: ADEQUATE
Special Requests: ADEQUATE

## 2022-02-02 NOTE — Progress Notes (Signed)
Name: Jill Davidson   MRN: [id number]    DOB: 12/26/1970   Date:02/05/2022       Progress Note  [facility] follow-up/Paperwork Completion  HPI  Hospital Admission: 01/28/2022 Hospital Discharge: 01/30/2022  Discharge Diagnosis: CAP, dysarthria   She  states she developed fatigue and lack of appetite on May 8th,she had hepatitis B vaccine on May 11 th and on May 12 th she had worsening of fatigue and on 05/14 she developed  dysarthria , falls , confusion , generalized weakness and went to [facility].  She states prior to going to [facility] she did not have any cough or SOB, but when she went home on 05/16 she developed a dry cough and has some SOB, no fever but has noticed some chills. Appetite is still poor but she is forcing herself to eat   CAP: she was given Rocephin and Zithromax for 3 days while at [facility], sent home a rx of Zpack and Omnicef  Dysarthria: unknown cause, negative stroke work up. Patient had a neurologist consultation and also speech therapy visit  while at [facility]. She will have follow up with neurologist as outpatient - [facility]  Acute kidney injury: Creatinine improved before discharge  Medication reconciliation was done: she was advised to hold losartan hctz upon discharge  Normal Echo done 01/30/2022   CT angio: showed enlarged main pulmonary artery   We will repeat CXR in 4 weeks   She had second hepatitis B at work on 05/11. She developed fall , dysarthria and generalizes weakness on 05/14 and went to [facility]. 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/05/2018   Vasomotor symptoms due to menopause 05/06/2018   S/P laparoscopic assisted vaginal hysterectomy (LAVH) bilateral salpingectomy right oophorectomy 03/24/2018   Essential hypertension 07/25/2016   Status post endometrial ablation 07/23/2016   IDA (iron deficiency anemia) 01/25/2015    Past Surgical History:  Procedure Laterality Date   CESAREAN SECTION     X2   DILATION AND CURETTAGE OF UTERUS     DILITATION & CURRETTAGE/HYSTROSCOPY WITH NOVASURE ABLATION N/A 07/23/2016   Procedure: DILATATION & CURETTAGE/HYSTEROSCOPY WITH NOVASURE ABLATION;  Surgeon: Brayton Mars, MD;  Location: [facility] ORS;  Service: Gynecology;  Laterality: N/A;   LAPAROSCOPIC VAGINAL HYSTERECTOMY WITH SALPINGECTOMY Bilateral 03/24/2018   Procedure: LAPAROSCOPIC ASSISTED VAGINAL HYSTERECTOMY WITH BILATERAL SALPINGECTOMY;  Surgeon: Brayton Mars, MD;  Location: [facility] ORS;  Service: Gynecology;  Laterality: Bilateral;    Family History  Problem Relation Age of Onset   Diabetes Father    Hypertension Father    Colon cancer Maternal Aunt    Breast cancer Maternal Aunt        great   Heart disease Maternal Aunt    Diabetes Maternal Grandmother    Hypertension Mother    Thyroid disease Mother    Lung cancer Mother    Asthma Mother    Hypertension Brother  Ovarian cancer Neg Hx     Social History   Tobacco Use   Smoking status: Every Day    Packs/day: 0.50    Years: 25.00    Pack years: 12.50    Types: Cigarettes    Start date: 07/03/1992   Smokeless tobacco: Never  Substance Use Topics   Alcohol use: Yes    Comment: every 2-3 weeks     Current Outpatient Medications:    acetaminophen (TYLENOL) 325 MG tablet, Take 650 mg by mouth every 6 (six) hours as needed., Disp: , Rfl:    azithromycin (ZITHROMAX) 250 MG tablet, One tab po daily for two days,  Disp: 2 each, Rfl: 0   fluticasone (FLONASE) 50 MCG/ACT nasal spray, Place 2 sprays into both nostrils daily., Disp: 16 g, Rfl: 0  Allergies  Allergen Reactions   Hepatitis B Virus Vaccines     Dysarthria     I personally reviewed active problem list, medication list, allergies, family history, social history, health maintenance with the patient/caregiver today.   ROS  Ten systems reviewed and is negative except as mentioned in HPI   Objective  Vitals:   02/05/22 0923  BP: 112/86  Pulse: 77  Resp: 16  SpO2: 98%  Weight: 222 lb (100.7 kg)  Height: '5\' 3"'$  (1.6 m)    Body mass index is 39.33 kg/m.  Physical Exam  Constitutional: Patient appears well-developed and well-nourished. Obese  No distress.  HEENT: head atraumatic, normocephalic, pupils equal and reactive to light, neck supple Cardiovascular: Normal rate, regular rhythm and normal heart sounds.  No murmur heard. No BLE edema. Pulmonary/Chest: Effort normal and breath sounds normal. No respiratory distress. Abdominal: Soft.  There is no tenderness. Psychiatric: Patient has a normal mood and affect. behavior is normal. Judgment and thought content normal.  Neurological: dysarthria   Recent Results (from the past 2160 hour(s))  Comprehensive metabolic panel     Status: Abnormal   Collection Time: 01/28/22  8:57 AM  Result Value Ref Range   Sodium 130 (L) 135 - 145 mmol/L   Potassium 3.1 (L) 3.5 - 5.1 mmol/L   Chloride 93 (L) 98 - 111 mmol/L   CO2 24 22 - 32 mmol/L   Glucose, Bld 157 (H) 70 - 99 mg/dL    Comment: Glucose reference range applies only to samples taken after fasting for at least 8 hours.   BUN 21 (H) 6 - 20 mg/dL   Creatinine, Ser 1.11 (H) 0.44 - 1.00 mg/dL   Calcium 8.4 (L) 8.9 - 10.3 mg/dL   Total Protein 7.9 6.5 - 8.1 g/dL   Albumin 3.3 (L) 3.5 - 5.0 g/dL   AST 61 (H) 15 - 41 U/L   ALT 34 0 - 44 U/L   Alkaline Phosphatase 56 38 - 126 U/L   Total Bilirubin 0.5 0.3 - 1.2 mg/dL   GFR,  Estimated >60 >60 mL/min    Comment: (NOTE) Calculated using the CKD-EPI Creatinine Equation (2021)    Anion gap 13 5 - 15    Comment: Performed at [facility], [address] [place], [place] [address]  CBC with Differential     Status: Abnormal   Collection Time: 01/28/22  8:57 AM  Result Value Ref Range   WBC 18.1 (H) 4.0 - 10.5 K/uL   RBC 5.07 3.87 - 5.11 MIL/uL   Hemoglobin 14.6 12.0 - 15.0 g/dL   HCT 43.1 36.0 - 46.0 %   MCV 85.0 80.0 - 100.0 fL  MCH 28.8 26.0 - 34.0 pg   MCHC 33.9 30.0 - 36.0 g/dL   RDW 13.2 11.5 - 15.5 %   Platelets 257 150 - 400 K/uL   nRBC 0.0 0.0 - 0.2 %   Neutrophils Relative % 83 %   Neutro Abs 15.1 (H) 1.7 - 7.7 K/uL   Lymphocytes Relative 11 %   Lymphs Abs 2.0 0.7 - 4.0 K/uL   Monocytes Relative 5 %   Monocytes Absolute 0.8 0.1 - 1.0 K/uL   Eosinophils Relative 0 %   Eosinophils Absolute 0.0 0.0 - 0.5 K/uL   Basophils Relative 0 %   Basophils Absolute 0.1 0.0 - 0.1 K/uL   Immature Granulocytes 1 %   Abs Immature Granulocytes 0.12 (H) 0.00 - 0.07 K/uL    Comment: Performed at [facility], [address] [place], [place] [address]  Protime-INR     Status: None   Collection Time: 01/28/22  8:57 AM  Result Value Ref Range   Prothrombin Time 14.9 11.4 - 15.2 seconds   INR 1.2 0.8 - 1.2    Comment: (NOTE) INR goal varies based on device and disease states. Performed at [facility], [address] [place], [place] [address]   APTT     Status: Abnormal   Collection Time: 01/28/22  8:57 AM  Result Value Ref Range   aPTT 40 (H) 24 - 36 seconds    Comment:        IF BASELINE aPTT IS ELEVATED, SUGGEST PATIENT RISK ASSESSMENT BE USED TO DETERMINE APPROPRIATE ANTICOAGULANT THERAPY. Performed at [facility], [address] [place],  [address]   hCG, quantitative, pregnancy     Status: None   Collection Time: 01/28/22  8:57 AM  Result Value Ref Range   hCG, Beta Chain, Quant, S 1 <5 mIU/mL     Comment:          GEST. AGE      CONC.  (mIU/mL)   <=1 WEEK        5 - 50     2 WEEKS       50 - 500     3 WEEKS       100 - 10,000     4 WEEKS     1,000 - 30,000     5 WEEKS     3,500 - 115,000   6-8 WEEKS     12,000 - 270,000    12 WEEKS     15,000 - 220,000        FEMALE AND NON-PREGNANT FEMALE:     LESS THAN 5 mIU/mL Performed at [facility], [place],  [address]   Procalcitonin - Baseline     Status: None   Collection Time: 01/28/22  8:57 AM  Result Value Ref Range   Procalcitonin 0.71 ng/mL    Comment:        Interpretation: PCT > 0.5 ng/mL and <= 2 ng/mL: Systemic infection (sepsis) is possible, but other conditions are known to elevate PCT as well. (NOTE)       Sepsis PCT Algorithm           Lower Respiratory Tract                                      Infection PCT Algorithm    ----------------------------     ----------------------------  PCT < 0.25 ng/mL                PCT < 0.10 ng/mL          Strongly encourage             Strongly discourage   discontinuation of antibiotics    initiation of antibiotics    ----------------------------     -----------------------------       PCT 0.25 - 0.50 ng/mL            PCT 0.10 - 0.25 ng/mL               OR       >80% decrease in PCT            Discourage initiation of                                            antibiotics      Encourage discontinuation           of antibiotics    ----------------------------     -----------------------------         PCT >= 0.50 ng/mL              PCT 0.26 - 0.50 ng/mL                AND       <80% decrease in PCT             Encourage initiation of                                             antibiotics       Encourage continuation           of antibiotics    ----------------------------     -----------------------------        PCT >= 0.50 ng/mL                  PCT > 0.50 ng/mL               AND         increase in PCT                   Strongly encourage                                      initiation of antibiotics    Strongly encourage escalation           of antibiotics                                     -----------------------------                                           PCT <= 0.25 ng/mL  OR                                        > 80% decrease in PCT                                      Discontinue / Do not initiate                                             antibiotics  Performed at [facility], [address] [place], [place] [address]   Magnesium     Status: None   Collection Time: 01/28/22  8:57 AM  Result Value Ref Range   Magnesium 2.2 1.7 - 2.4 mg/dL    Comment: Performed at [facility], [address] [place], [place] [address]  TSH     Status: None   Collection Time: 01/28/22  8:57 AM  Result Value Ref Range   TSH 0.888 0.350 - 4.500 uIU/mL    Comment: Performed by a 3rd Generation assay with a functional sensitivity of <=0.01 uIU/mL. Performed at [facility], [place], [place] [address]   Troponin I (High Sensitivity)     Status: None   Collection Time: 01/28/22  8:57 AM  Result Value Ref Range   Troponin I (High Sensitivity) 14 <18 ng/L    Comment: (NOTE) Elevated high sensitivity troponin I (hsTnI) values and significant  changes across serial measurements may suggest ACS but many other  chronic and acute conditions are known to elevate hsTnI results.  Refer to the "Links" section for chest pain algorithms and additional  guidance. Performed at [facility], [address] [place], [place] [address]   CBG monitoring, ED     Status: Abnormal   Collection Time: 01/28/22  9:02 AM  Result Value Ref Range   Glucose-Capillary 183 (H) 70 - 99 mg/dL    Comment: Glucose reference range applies only to samples taken after fasting for at least 8 hours.  Lactic acid, plasma     Status:  None   Collection Time: 01/28/22 10:12 AM  Result Value Ref Range   Lactic Acid, Venous 1.4 0.5 - 1.9 mmol/L    Comment: Performed at [facility], [address] [place], [place] [address]  Blood Culture (routine x 2)     Status: None   Collection Time: 01/28/22 10:12 AM   Specimen: BLOOD  Result Value Ref Range   Specimen Description BLOOD RIGHT ANTECUBITAL    Special Requests      BOTTLES DRAWN AEROBIC AND ANAEROBIC Blood Culture adequate volume   Culture      NO GROWTH 5 DAYS Performed at [facility], [address] [place],  [address]    Report Status 02/02/2022 FINAL   Blood Culture (routine x 2)     Status: None   Collection Time: 01/28/22 10:12 AM   Specimen: BLOOD  Result Value Ref Range   Specimen Description BLOOD BLOOD LEFT HAND    Special Requests      BOTTLES DRAWN AEROBIC AND ANAEROBIC Blood Culture adequate volume   Culture      NO GROWTH 5 DAYS Performed  at [facility], [address] [place], [place] [address]    Report Status 02/02/2022 FINAL   Urinalysis, Complete w Microscopic     Status: Abnormal   Collection Time: 01/28/22 10:19 AM  Result Value Ref Range   Color, Urine AMBER (A) YELLOW    Comment: BIOCHEMICALS MAY BE AFFECTED BY COLOR   APPearance CLOUDY (A) CLEAR   Specific Gravity, Urine 1.023 1.005 - 1.030   pH 5.0 5.0 - 8.0   Glucose, UA NEGATIVE NEGATIVE mg/dL   Hgb urine dipstick LARGE (A) NEGATIVE   Bilirubin Urine NEGATIVE NEGATIVE   Ketones, ur 5 (A) NEGATIVE mg/dL   Protein, ur 100 (A) NEGATIVE mg/dL   Nitrite NEGATIVE NEGATIVE   Leukocytes,Ua NEGATIVE NEGATIVE   RBC / HPF 0-5 0 - 5 RBC/hpf   WBC, UA 6-10 0 - 5 WBC/hpf   Bacteria, UA MANY (A) NONE SEEN   Squamous Epithelial / LPF 0-5 0 - 5   Mucus PRESENT     Comment: Performed at [facility], [address], [place], [place] [address]  Resp Panel by RT-PCR (Flu A&B, Covid)     Status: None   Collection Time: 01/28/22 10:19 AM    Specimen: Nasopharyngeal(NP) swabs in vial transport medium  Result Value Ref Range   SARS Coronavirus 2 by RT PCR NEGATIVE NEGATIVE    Comment: (NOTE) SARS-CoV-2 target nucleic acids are NOT DETECTED.  The SARS-CoV-2 RNA is generally detectable in upper respiratory specimens during the acute phase of infection. The lowest concentration of SARS-CoV-2 viral copies this assay can detect is 138 copies/mL. A negative result does not preclude SARS-Cov-2 infection and should not be used as the sole basis for treatment or other patient management decisions. A negative result may occur with  improper specimen collection/handling, submission of specimen other than nasopharyngeal swab, presence of viral mutation(s) within the areas targeted by this assay, and inadequate number of viral copies(<138 copies/mL). A negative result must be combined with clinical observations, patient history, and epidemiological information. The expected result is Negative.  Fact Sheet for Patients:  EntrepreneurPulse.com.au  Fact Sheet for Healthcare Providers:  IncredibleEmployment.be  This test is no t yet approved or cleared by the [place] FDA and  has been authorized for detection and/or diagnosis of SARS-CoV-2 by FDA under an Emergency Use Authorization (EUA). This EUA will remain  in effect (meaning this test can be used) for the duration of the COVID-19 declaration under Section 564(b)(1) of the Act, 21 U.S.C.section 360bbb-3(b)(1), unless the authorization is terminated  or revoked sooner.       Influenza A by PCR NEGATIVE NEGATIVE   Influenza B by PCR NEGATIVE NEGATIVE    Comment: (NOTE) The Xpert Xpress SARS-CoV-2/FLU/RSV plus assay is intended as an aid in the diagnosis of influenza from Nasopharyngeal swab specimens and should not be used as a sole basis for treatment. Nasal washings and aspirates are unacceptable for Xpert Xpress  SARS-CoV-2/FLU/RSV testing.  Fact Sheet for Patients: EntrepreneurPulse.com.au  Fact Sheet for Healthcare Providers: IncredibleEmployment.be  This test is not yet approved or cleared by the [place] FDA and has been authorized for detection and/or diagnosis of SARS-CoV-2 by FDA under an Emergency Use Authorization (EUA). This EUA will remain in effect (meaning this test can be used) for the duration of the COVID-19 declaration under Section 564(b)(1) of the Act, 21 U.S.C. section 360bbb-3(b)(1), unless the authorization is terminated or revoked.  Performed at [facility], [address] [place],  [place] [address]   Urine Culture     Status: Abnormal   Collection Time: 01/28/22 11:05 AM   Specimen: Urine, Random  Result Value Ref Range   Specimen Description      URINE, RANDOM Performed at [facility], [address], [place], [place] [address]    Special Requests      NONE Performed at [facility], [address], [place], [place] [address]    Culture (A)     <10,000 COLONIES/mL INSIGNIFICANT GROWTH Performed at [facility], [address], [place], [place] [address]    Report Status 01/30/2022 FINAL   Troponin I (High Sensitivity)     Status: None   Collection Time: 01/28/22 12:58 PM  Result Value Ref Range   Troponin I (High Sensitivity) 15 <18 ng/L    Comment: (NOTE) Elevated high sensitivity troponin I (hsTnI) values and significant  changes across serial measurements may suggest ACS but many other  chronic and acute conditions are known to elevate hsTnI results.  Refer to the "Links" section for chest pain algorithms and additional  guidance. Performed at [facility], [address] [place], [place] [address]   Legionella Pneumophila Serogp 1 Ur Ag     Status: None   Collection Time: 01/28/22  2:05 PM  Result Value Ref Range   L. pneumophila Serogp 1 Ur Ag Negative  Negative    Comment: (NOTE) Presumptive negative for L. pneumophila serogroup 1 antigen in urine, suggesting no recent or current infection. Legionnaires' disease cannot be ruled out since other serogroups and species may also cause disease. Performed At: [facility] [place], [place] [id number] Rush Farmer MD MH:[id number]    Source of Sample URINE, RANDOM     Comment: Performed at [facility], [address] [place], [place] [address]  Strep pneumoniae urinary antigen     Status: None   Collection Time: 01/28/22  2:05 PM  Result Value Ref Range   Strep Pneumo Urinary Antigen NEGATIVE NEGATIVE    Comment:        Infection due to S. pneumoniae cannot be absolutely ruled out since the antigen present may be below the detection limit of the test. Performed at [facility], [address], [place], [place] [address]   HIV Antibody (routine testing w rflx)     Status: None   Collection Time: 01/29/22  3:48 AM  Result Value Ref Range   HIV Screen 4th Generation wRfx Non Reactive Non Reactive    Comment: Performed at [facility], [address], [place], [place] [address]  Procalcitonin     Status: None   Collection Time: 01/29/22  3:48 AM  Result Value Ref Range   Procalcitonin 0.37 ng/mL    Comment:        Interpretation: PCT (Procalcitonin) <= 0.5 ng/mL: Systemic infection (sepsis) is not likely. Local bacterial infection is possible. (NOTE)       Sepsis PCT Algorithm           Lower Respiratory Tract                                      Infection PCT Algorithm    ----------------------------     ----------------------------         PCT < 0.25 ng/mL  PCT < 0.10 ng/mL          Strongly encourage             Strongly discourage   discontinuation of antibiotics    initiation of antibiotics    ----------------------------     -----------------------------       PCT 0.25 - 0.50 ng/mL            PCT 0.10 - 0.25  ng/mL               OR       >80% decrease in PCT            Discourage initiation of                                            antibiotics      Encourage discontinuation           of antibiotics    ----------------------------     -----------------------------         PCT >= 0.50 ng/mL              PCT 0.26 - 0.50 ng/mL               AND        <80% decrease in PCT             Encourage initiation of                                             antibiotics       Encourage continuation           of antibiotics    ----------------------------     -----------------------------        PCT >= 0.50 ng/mL                  PCT > 0.50 ng/mL               AND         increase in PCT                  Strongly encourage                                      initiation of antibiotics    Strongly encourage escalation           of antibiotics                                     -----------------------------                                           PCT <= 0.25 ng/mL                                                 OR                                        >  80% decrease in PCT                                      Discontinue / Do not initiate                                             antibiotics  Performed at [facility], [address] [place], [place] [address]   Comprehensive metabolic panel     Status: Abnormal   Collection Time: 01/29/22  3:48 AM  Result Value Ref Range   Sodium 135 135 - 145 mmol/L   Potassium 3.5 3.5 - 5.1 mmol/L   Chloride 102 98 - 111 mmol/L   CO2 25 22 - 32 mmol/L   Glucose, Bld 105 (H) 70 - 99 mg/dL    Comment: Glucose reference range applies only to samples taken after fasting for at least 8 hours.   BUN 12 6 - 20 mg/dL   Creatinine, Ser 0.79 0.44 - 1.00 mg/dL   Calcium 8.2 (L) 8.9 - 10.3 mg/dL   Total Protein 5.9 (L) 6.5 - 8.1 g/dL   Albumin 2.5 (L) 3.5 - 5.0 g/dL   AST 58 (H) 15 - 41 U/L   ALT 32 0 - 44 U/L   Alkaline Phosphatase 40 38 - 126  U/L   Total Bilirubin 0.3 0.3 - 1.2 mg/dL   GFR, Estimated >60 >60 mL/min    Comment: (NOTE) Calculated using the CKD-EPI Creatinine Equation (2021)    Anion gap 8 5 - 15    Comment: Performed at [facility], [address] [place], [place] [address]  CBC with Differential/Platelet     Status: Abnormal   Collection Time: 01/29/22  3:48 AM  Result Value Ref Range   WBC 10.6 (H) 4.0 - 10.5 K/uL   RBC 3.97 3.87 - 5.11 MIL/uL   Hemoglobin 11.6 (L) 12.0 - 15.0 g/dL   HCT 34.2 (L) 36.0 - 46.0 %   MCV 86.1 80.0 - 100.0 fL   MCH 29.2 26.0 - 34.0 pg   MCHC 33.9 30.0 - 36.0 g/dL   RDW 13.3 11.5 - 15.5 %   Platelets 208 150 - 400 K/uL   nRBC 0.0 0.0 - 0.2 %   Neutrophils Relative % 62 %   Neutro Abs 6.6 1.7 - 7.7 K/uL   Lymphocytes Relative 28 %   Lymphs Abs 2.9 0.7 - 4.0 K/uL   Monocytes Relative 8 %   Monocytes Absolute 0.8 0.1 - 1.0 K/uL   Eosinophils Relative 0 %   Eosinophils Absolute 0.0 0.0 - 0.5 K/uL   Basophils Relative 0 %   Basophils Absolute 0.0 0.0 - 0.1 K/uL   Immature Granulocytes 2 %   Abs Immature Granulocytes 0.17 (H) 0.00 - 0.07 K/uL    Comment: Performed at [facility], [address] [place],  [address]  Procalcitonin     Status: None   Collection Time: 01/30/22  4:57 AM  Result Value Ref Range   Procalcitonin 0.17 ng/mL    Comment:        Interpretation: PCT (Procalcitonin) <= 0.5 ng/mL: Systemic infection (sepsis) is not likely. Local bacterial infection is possible. (NOTE)       Sepsis PCT Algorithm  Lower Respiratory Tract                                      Infection PCT Algorithm    ----------------------------     ----------------------------         PCT < 0.25 ng/mL                PCT < 0.10 ng/mL          Strongly encourage             Strongly discourage   discontinuation of antibiotics    initiation of antibiotics    ----------------------------     -----------------------------       PCT 0.25 - 0.50  ng/mL            PCT 0.10 - 0.25 ng/mL               OR       >80% decrease in PCT            Discourage initiation of                                            antibiotics      Encourage discontinuation           of antibiotics    ----------------------------     -----------------------------         PCT >= 0.50 ng/mL              PCT 0.26 - 0.50 ng/mL               AND        <80% decrease in PCT             Encourage initiation of                                             antibiotics       Encourage continuation           of antibiotics    ----------------------------     -----------------------------        PCT >= 0.50 ng/mL                  PCT > 0.50 ng/mL               AND         increase in PCT                  Strongly encourage                                      initiation of antibiotics    Strongly encourage escalation           of antibiotics                                     -----------------------------  PCT <= 0.25 ng/mL                                                 OR                                        > 80% decrease in PCT                                      Discontinue / Do not initiate                                             antibiotics  Performed at [facility], [address] [place], [place] [address]   Lipid panel     Status: Abnormal   Collection Time: 01/30/22  4:57 AM  Result Value Ref Range   Cholesterol 134 0 - 200 mg/dL   Triglycerides 226 (H) <150 mg/dL   HDL <10 (L) >40 mg/dL   Total CHOL/HDL Ratio NOT CALCULATED RATIO   VLDL 45 (H) 0 - 40 mg/dL   LDL Cholesterol NOT CALCULATED 0 - 99 mg/dL    Comment: Performed at [facility], [address] [place], [place] [address]  CBC     Status: Abnormal   Collection Time: 01/30/22  4:57 AM  Result Value Ref Range   WBC 8.7 4.0 - 10.5 K/uL   RBC 3.79 (L) 3.87 - 5.11 MIL/uL   Hemoglobin 11.1 (L) 12.0 - 15.0 g/dL   HCT  33.1 (L) 36.0 - 46.0 %   MCV 87.3 80.0 - 100.0 fL   MCH 29.3 26.0 - 34.0 pg   MCHC 33.5 30.0 - 36.0 g/dL   RDW 13.6 11.5 - 15.5 %   Platelets 234 150 - 400 K/uL   nRBC 0.0 0.0 - 0.2 %    Comment: Performed at [facility], [address] [place], [place] [address]  ECHOCARDIOGRAM COMPLETE     Status: None   Collection Time: 01/30/22 10:20 AM  Result Value Ref Range   Weight 3,520 oz   Height 63 in   BP 144/91 mmHg   Ao pk vel 1.18 m/s   AV Area VTI 2.25 cm2   AR max vel 2.40 cm2   AV Mean grad 3.0 mmHg   AV Peak grad 5.6 mmHg   S' Lateral 3.28 cm   AV Area mean vel 2.54 cm2   Area-P 1/2 3.66 cm2   MV VTI 1.32 cm2  Acetylcholine receptor, binding     Status: None   Collection Time: 01/30/22  5:48 PM  Result Value Ref Range   Acety choline binding ab <0.03 0.00 - 0.24 nmol/L    Comment: (NOTE)                               Negative:   0.00 - 0.24  Borderline: 0.25 - 0.40                               Positive:         >0.40 Performed At: [facility] [place], [place] [id number] Rush Farmer MD WU:[id number]      PHQ2/9:    02/05/2022    9:22 AM 09/30/2020    2:46 PM 09/28/2020    8:02 AM 09/05/2020    2:44 PM 08/17/2020    9:16 AM  Depression screen PHQ 2/9  Decreased Interest 3 0 '1 3 2  '$ Down, Depressed, Hopeless 3 0 0 0 0  PHQ - 2 Score 6 0 '1 3 2  '$ Altered sleeping 3 2 0 0 2  Tired, decreased energy '3 2 1 3 2  '$ Change in appetite 1 0 1 0 0  Feeling bad or failure about yourself  0 0 0 0 0  Trouble concentrating 1 0 0 0 0  Moving slowly or fidgety/restless 0 0 0 0 0  Suicidal thoughts 0 0 0 0 0  PHQ-9 Score '14 4 3 6 6  '$ Difficult doing work/chores  Not difficult at all  Not difficult at all     phq 9 is positive - acute problem, we will re-evaluate in one month   Fall Risk:    02/05/2022    9:22 AM 09/30/2020    2:45 PM 09/28/2020    8:02 AM 09/05/2020    2:38 PM 08/17/2020    9:16 AM  Fall Risk    Falls in the past year? 1 0 0 0 0  Number falls in past yr: 1 0 0 0 0  Injury with Fall? 0 0 0 0 0  Risk for fall due to : No Fall Risks      Follow up Falls prevention discussed          Functional Status Survey: Is the patient deaf or have difficulty hearing?: Yes Does the patient have difficulty seeing, even when wearing glasses/contacts?: Yes Does the patient have difficulty concentrating, remembering, or making decisions?: Yes Does the patient have difficulty walking or climbing stairs?: Yes Does the patient have difficulty dressing or bathing?: No Does the patient have difficulty doing errands alone such as visiting a doctor's office or shopping?: No    Assessment & Plan  1. Hospital discharge follow-up  - COMPLETE METABOLIC PANEL WITH GFR - CBC with Differential/Platelet - DG Chest 2 View; Future  2. Community acquired pneumonia of right lower lobe of lung  - COMPLETE METABOLIC PANEL WITH GFR - CBC with Differential/Platelet - DG Chest 2 View; Future   We will make sure she gets appointment with neurologist She will get PT/speech therapy. I signed forms

## 2022-02-05 ENCOUNTER — Encounter: Payer: Self-pay | Admitting: Hematology and Oncology

## 2022-02-05 ENCOUNTER — Ambulatory Visit (INDEPENDENT_AMBULATORY_CARE_PROVIDER_SITE_OTHER): Payer: Self-pay | Admitting: Family Medicine

## 2022-02-05 ENCOUNTER — Encounter: Payer: Self-pay | Admitting: Family Medicine

## 2022-02-05 VITALS — BP 112/86 | HR 77 | Resp 16 | Ht 63.0 in | Wt 222.0 lb

## 2022-02-05 DIAGNOSIS — Z1211 Encounter for screening for malignant neoplasm of colon: Secondary | ICD-10-CM

## 2022-02-05 DIAGNOSIS — Z09 Encounter for follow-up examination after completed treatment for conditions other than malignant neoplasm: Secondary | ICD-10-CM

## 2022-02-05 DIAGNOSIS — J189 Pneumonia, unspecified organism: Secondary | ICD-10-CM

## 2022-02-06 ENCOUNTER — Encounter: Payer: Self-pay | Admitting: Family Medicine

## 2022-02-06 ENCOUNTER — Inpatient Hospital Stay: Payer: Medicaid Other | Admitting: Neurology

## 2022-03-12 ENCOUNTER — Ambulatory Visit
Admission: RE | Admit: 2022-03-12 | Discharge: 2022-03-12 | Disposition: A | Discharge status: Home / Self Care | Payer: Medicaid Other | Source: Ambulatory Visit | Attending: Family Medicine | Admitting: Family Medicine

## 2022-03-12 ENCOUNTER — Ambulatory Visit
Admission: RE | Admit: 2022-03-12 | Discharge: 2022-03-12 | Disposition: A | Discharge status: Home / Self Care | Payer: Medicaid Other | Attending: Family Medicine | Admitting: Family Medicine

## 2022-03-12 DIAGNOSIS — R591 Generalized enlarged lymph nodes: Secondary | ICD-10-CM | POA: Diagnosis not present

## 2022-03-12 DIAGNOSIS — J189 Pneumonia, unspecified organism: Secondary | ICD-10-CM

## 2022-03-12 DIAGNOSIS — Z09 Encounter for follow-up examination after completed treatment for conditions other than malignant neoplasm: Secondary | ICD-10-CM

## 2022-03-13 ENCOUNTER — Other Ambulatory Visit: Payer: Self-pay

## 2022-03-13 DIAGNOSIS — J189 Pneumonia, unspecified organism: Secondary | ICD-10-CM

## 2022-03-13 LAB — CBC WITH DIFFERENTIAL/PLATELET
Absolute Monocytes: 597 cells/uL (ref 200–950)
Basophils Absolute: 47 cells/uL (ref 0–200)
Basophils Relative: 0.4 %
Eosinophils Absolute: 164 cells/uL (ref 15–500)
Eosinophils Relative: 1.4 %
HCT: 40.5 % (ref 35.0–45.0)
Hemoglobin: 13.8 g/dL (ref 11.7–15.5)
Lymphs Abs: 5850 cells/uL — ABNORMAL HIGH (ref 850–3900)
MCH: 30.5 pg (ref 27.0–33.0)
MCHC: 34.1 g/dL (ref 32.0–36.0)
MCV: 89.4 fL (ref 80.0–100.0)
MPV: 9.7 fL (ref 7.5–12.5)
Monocytes Relative: 5.1 %
Neutro Abs: 5043 cells/uL (ref 1500–7800)
Neutrophils Relative %: 43.1 %
Platelets: 357 10*3/uL (ref 140–400)
RBC: 4.53 10*6/uL (ref 3.80–5.10)
RDW: 13.4 % (ref 11.0–15.0)
Total Lymphocyte: 50 %
WBC: 11.7 10*3/uL — ABNORMAL HIGH (ref 3.8–10.8)

## 2022-03-13 LAB — COMPLETE METABOLIC PANEL WITH GFR
AG Ratio: 1.5 (calc) (ref 1.0–2.5)
ALT: 15 U/L (ref 6–29)
AST: 15 U/L (ref 10–35)
Albumin: 3.9 g/dL (ref 3.6–5.1)
Alkaline phosphatase (APISO): 62 U/L (ref 31–125)
BUN: 9 mg/dL (ref 7–25)
CO2: 24 mmol/L (ref 20–32)
Calcium: 9.4 mg/dL (ref 8.6–10.2)
Chloride: 107 mmol/L (ref 98–110)
Creat: 0.65 mg/dL (ref 0.50–0.99)
Globulin: 2.6 g/dL (calc) (ref 1.9–3.7)
Glucose, Bld: 96 mg/dL (ref 65–139)
Potassium: 3.8 mmol/L (ref 3.5–5.3)
Sodium: 139 mmol/L (ref 135–146)
Total Bilirubin: 0.4 mg/dL (ref 0.2–1.2)
Total Protein: 6.5 g/dL (ref 6.1–8.1)
eGFR: 109 mL/min/{1.73_m2} (ref >=60)

## 2022-03-13 NOTE — Progress Notes (Signed)
Name: Jill Davidson   MRN: [id number]    DOB: [birth year]   Date:03/14/2022       Progress Note  Subjective  Chief Complaint  Follow Up  HPI  Abnormal CXR: diagnosed with CAP at [facility] on 05/14, she took medication Rocephin and Zpack as prescribed, no cough, wheezing or SOB. She is a smoker but at the time of her visit to [facility] she went for fatigue and falls and diagnosed with CAP. She had a repeat CXR this week that still showed the findings below   IMPRESSION:03/12/2022  Improved aeration of the lungs, however, there is increased fullness in the right paratracheal soft tissues indicating persisting lymphadenopathy. Either interval follow-up chest x-ray or repeat chest CT recommended.  We discussed results and we order CT chest   Tobacco use: down to half a pack daily, discussed importance of quitting   Reviewed WBC back to baseline in the 11 range , however due to lymphadenopathy on CXR and elevated lymphocytes we will send her to hematologist, she has seen them in the past for iron deficiency anemia but elevated white count not evaluated   Patient Active Problem List   Diagnosis Date Noted   Dysarthria 01/29/2022   Adverse drug reaction 01/29/2022   AKI (acute kidney injury) ([place]) 01/29/2022   Hypokalemia 01/29/2022   CAP (community acquired pneumonia) 01/28/2022   Morbid obesity ([place]) 09/05/2020   Tobacco user 07/24/2018   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) 06/05/2018   Vasomotor symptoms due to menopause 05/06/2018   S/P laparoscopic assisted vaginal hysterectomy (LAVH) bilateral salpingectomy right oophorectomy 03/24/2018   Essential hypertension 07/25/2016   Status post endometrial ablation 07/23/2016   IDA (iron deficiency anemia) 01/25/2015    Past Surgical History:  Procedure Laterality Date   CESAREAN SECTION     X2   DILATION AND CURETTAGE OF UTERUS     DILITATION &  CURRETTAGE/HYSTROSCOPY WITH NOVASURE ABLATION N/A 07/23/2016   Procedure: DILATATION & CURETTAGE/HYSTEROSCOPY WITH NOVASURE ABLATION;  Surgeon: Brayton Mars, MD;  Location: [facility] ORS;  Service: Gynecology;  Laterality: N/A;   LAPAROSCOPIC VAGINAL HYSTERECTOMY WITH SALPINGECTOMY Bilateral 03/24/2018   Procedure: LAPAROSCOPIC ASSISTED VAGINAL HYSTERECTOMY WITH BILATERAL SALPINGECTOMY;  Surgeon: Brayton Mars, MD;  Location: [facility] OR[facility];  Service: Gynecology;  Laterality: Bilateral;    Family History  Problem Relation Age of Onset   Diabetes Father    Hypertension Father    Colon cancer Maternal Aunt    Breast cancer Maternal Aunt        great   Heart disease Maternal Aunt    Diabetes Maternal Grandmother    Hypertension Mother    Thyroid disease Mother    Lung cancer Mother    Asthma Mother    Hypertension Brother    Ovarian cancer Neg Hx     Social History   Tobacco Use   Smoking status: Every Day    Packs/day: 0.50    Years: 25.00    Total pack years: 12.50    Types: Cigarettes    Start date: 07/03/1992   Smokeless tobacco: Never  Substance Use Topics   Alcohol use: Yes    Comment: every 2-3 weeks     Current Outpatient Medications:    acetaminophen (TYLENOL) 325 MG tablet, Take 650 mg by mouth every 6 (six) hours as needed., Disp: , Rfl:    fluticasone (FLONASE) 50 MCG/ACT nasal spray, Place 2 sprays into both  nostrils daily., Disp: 16 g, Rfl: 0  Allergies  Allergen Reactions   Hepatitis B Virus Vaccines     Dysarthria     I personally reviewed active problem list, medication list, allergies, family history, social history, health maintenance with the patient/caregiver today.   ROS  Constitutional: Negative for fever or weight change.  Respiratory: Negative for cough and shortness of breath.   Cardiovascular: Negative for chest pain or palpitations.  Gastrointestinal: Negative for abdominal pain, no bowel changes.  Musculoskeletal: Negative for gait  problem or joint swelling.  Skin: Negative for rash.  Neurological: Negative for dizziness or headache.  No other specific complaints in a complete review of systems (except as listed in HPI above).   Objective  Vitals:   03/14/22 0940  BP: 132/86  Pulse: 86  Resp: 16  SpO2: 98%  Weight: 222 lb (100.7 kg)  Height: '5\' 4"'$  (1.626 m)    Body mass index is 38.11 kg/m.  Physical Exam  Constitutional: Patient appears well-developed and well-nourished. Obese  No distress.  HEENT: head atraumatic, normocephalic, pupils equal and reactive to light, neck supple Cardiovascular: Normal rate, regular rhythm and normal heart sounds.  No murmur heard. No BLE edema. Pulmonary/Chest: Effort normal and breath sounds normal. No respiratory distress. Abdominal: Soft.  There is no tenderness. Psychiatric: Patient has a normal mood and affect. behavior is normal. Judgment and thought content normal.     PHQ2/9:    03/14/2022    9:39 AM 02/05/2022    9:22 AM 09/30/2020    2:46 PM 09/28/2020    8:02 AM 09/05/2020    2:44 PM  Depression screen PHQ 2/9  Decreased Interest 0 3 0 1 3  Down, Depressed, Hopeless 0 3 0 0 0  PHQ - 2 Score 0 6 0 1 3  Altered sleeping 0 3 2 0 0  Tired, decreased energy '3 3 2 1 3  '$ Change in appetite 3 1 0 1 0  Feeling bad or failure about yourself  0 0 0 0 0  Trouble concentrating 0 1 0 0 0  Moving slowly or fidgety/restless 0 0 0 0 0  Suicidal thoughts 0 0 0 0 0  PHQ-9 Score '6 14 4 3 6  '$ Difficult doing work/chores   Not difficult at all  Not difficult at all    phq 9 is negative   Fall Risk:    03/14/2022    9:35 AM 02/05/2022    9:22 AM 09/30/2020    2:45 PM 09/28/2020    8:02 AM 09/05/2020    2:38 PM  Fall Risk   Falls in the past year? 1 1 0 0 0  Number falls in past yr: 1 1 0 0 0  Injury with Fall? 0 0 0 0 0  Risk for fall due to : No Fall Risks No Fall Risks     Follow up Falls prevention discussed Falls prevention discussed         Functional  Status Survey: Is the patient deaf or have difficulty hearing?: No Does the patient have difficulty seeing, even when wearing glasses/contacts?: No Does the patient have difficulty concentrating, remembering, or making decisions?: No Does the patient have difficulty walking or climbing stairs?: No Does the patient have difficulty dressing or bathing?: No Does the patient have difficulty doing errands alone such as visiting a doctor's office or shopping?: No    Assessment & Plan  1. Paratracheal lymphadenopathy  - Ambulatory referral to Hematology / Oncology  2. Lymphocytosis  - Ambulatory referral to Hematology / Oncology  3. Other fatigue  - Ambulatory referral to Hematology / Oncology  4. Abnormal CXR  - Ambulatory referral to Hematology / Oncology   CT chest without contrast

## 2022-03-14 ENCOUNTER — Encounter: Payer: Self-pay | Admitting: Family Medicine

## 2022-03-14 ENCOUNTER — Ambulatory Visit: Payer: Medicaid Other | Admitting: Family Medicine

## 2022-03-14 VITALS — BP 132/86 | HR 86 | Resp 16 | Ht 64.0 in | Wt 222.0 lb

## 2022-03-14 DIAGNOSIS — R5383 Other fatigue: Secondary | ICD-10-CM

## 2022-03-14 DIAGNOSIS — R590 Localized enlarged lymph nodes: Secondary | ICD-10-CM

## 2022-03-14 DIAGNOSIS — D72820 Lymphocytosis (symptomatic): Secondary | ICD-10-CM | POA: Diagnosis not present

## 2022-03-14 DIAGNOSIS — R9389 Abnormal findings on diagnostic imaging of other specified body structures: Secondary | ICD-10-CM

## 2022-03-23 ENCOUNTER — Inpatient Hospital Stay: Payer: Medicaid Other

## 2022-03-23 ENCOUNTER — Inpatient Hospital Stay: Payer: Medicaid Other | Attending: Oncology | Admitting: Oncology

## 2022-03-23 ENCOUNTER — Other Ambulatory Visit: Payer: Self-pay | Admitting: Family Medicine

## 2022-03-23 ENCOUNTER — Telehealth: Payer: Self-pay

## 2022-03-23 ENCOUNTER — Encounter: Payer: Self-pay | Admitting: Oncology

## 2022-03-23 VITALS — BP 185/119 | HR 76 | Temp 97.7°F | Resp 18 | Ht 64.0 in | Wt 224.4 lb

## 2022-03-23 DIAGNOSIS — Z801 Family history of malignant neoplasm of trachea, bronchus and lung: Secondary | ICD-10-CM | POA: Insufficient documentation

## 2022-03-23 DIAGNOSIS — Z720 Tobacco use: Secondary | ICD-10-CM

## 2022-03-23 DIAGNOSIS — Z803 Family history of malignant neoplasm of breast: Secondary | ICD-10-CM | POA: Diagnosis not present

## 2022-03-23 DIAGNOSIS — F17210 Nicotine dependence, cigarettes, uncomplicated: Secondary | ICD-10-CM | POA: Diagnosis not present

## 2022-03-23 DIAGNOSIS — R591 Generalized enlarged lymph nodes: Secondary | ICD-10-CM | POA: Diagnosis present

## 2022-03-23 DIAGNOSIS — R590 Localized enlarged lymph nodes: Secondary | ICD-10-CM

## 2022-03-23 DIAGNOSIS — R9389 Abnormal findings on diagnostic imaging of other specified body structures: Secondary | ICD-10-CM

## 2022-03-23 DIAGNOSIS — D72828 Other elevated white blood cell count: Secondary | ICD-10-CM

## 2022-03-23 LAB — CBC WITH DIFFERENTIAL/PLATELET
Abs Immature Granulocytes: 0.03 10*3/uL (ref 0.00–0.07)
Basophils Absolute: 0.1 10*3/uL (ref 0.0–0.1)
Basophils Relative: 1 %
Eosinophils Absolute: 0.2 10*3/uL (ref 0.0–0.5)
Eosinophils Relative: 1 %
HCT: 40.7 % (ref 36.0–46.0)
Hemoglobin: 14.1 g/dL (ref 12.0–15.0)
Immature Granulocytes: 0 %
Lymphocytes Relative: 46 %
Lymphs Abs: 6 10*3/uL — ABNORMAL HIGH (ref 0.7–4.0)
MCH: 31.2 pg (ref 26.0–34.0)
MCHC: 34.6 g/dL (ref 30.0–36.0)
MCV: 90 fL (ref 80.0–100.0)
Monocytes Absolute: 0.6 10*3/uL (ref 0.1–1.0)
Monocytes Relative: 5 %
Neutro Abs: 6.1 10*3/uL (ref 1.7–7.7)
Neutrophils Relative %: 47 %
Platelets: 317 10*3/uL (ref 150–400)
RBC: 4.52 MIL/uL (ref 3.87–5.11)
RDW: 13.4 % (ref 11.5–15.5)
WBC: 12.9 10*3/uL — ABNORMAL HIGH (ref 4.0–10.5)
nRBC: 0 % (ref 0.0–0.2)

## 2022-03-23 LAB — HEPATITIS PANEL, ACUTE
HCV Ab: NONREACTIVE
Hep A IgM: NONREACTIVE
Hep B C IgM: NONREACTIVE
Hepatitis B Surface Ag: NONREACTIVE

## 2022-03-23 NOTE — Progress Notes (Signed)
Patient here to establish care. BP elevated

## 2022-03-23 NOTE — Telephone Encounter (Signed)
CT chest wo ordered by Dr. Ancil Boozer and scheduled. Dr. Tasia Catchings recommending CT chest with contrast. Per secure chat, Dr. Ancil Boozer will change order to CT chest W and her team will work to update auth and radiology of changes.

## 2022-03-24 ENCOUNTER — Encounter: Payer: Self-pay | Admitting: Hematology and Oncology

## 2022-03-24 NOTE — Progress Notes (Signed)
Hematology/Oncology Consult note [phone] [phone]         Patient Care Team: Steele Sizer, MD as PCP - General (Family Medicine)  REFERRING PROVIDER: Steele Sizer, MD  CHIEF COMPLAINTS/REASON FOR VISIT:  Evaluation of paratracheal lymphadenopathy, leukocytosis  HISTORY OF PRESENTING ILLNESS:   Jill Davidson is a  51 y.o.  female with PMH listed below was seen in consultation at the request of  Steele Sizer, MD  for evaluation of paratracheal lymphadenopathy, leukocytosis  01/28/2022 - 01/30/2022, patient was admitted due to community-acquired pneumonia and was treated for IV antibiotics.  01/28/2022, CT angio chest PE with and without contrast showed no pulmonary embolism,RIGHT lower lobe pneumonia with reactive hilar and mediastinal adenopathy. 03/13/2022, follow-up chest x-ray showed improvement aeration of the lungs however there is increased fullness in the right paratracheal soft tissue indicating persistent lymphadenopathy.  CT chest has been ordered by primary care provider.  Patient was referred to establish care for further evaluation.  Patient denies cough, shortness of breath, chest pain, hemoptysis, unintentional weight loss, night sweats fever.  She is currently everyday smoker, half pack a day for 25 years.   She also presented with slurred speech and 01/29/2022, MRI of the brain No evidence of recent infarction, hemorrhage, or mass. No abnormal enhancement.Cerebellar tonsillar ectopia  Chronic leukocytosis, intermittently since at least 2019.  Review of Systems  Constitutional:  Negative for appetite change, chills, fatigue and fever.  HENT:   Negative for hearing loss and voice change.   Eyes:  Negative for eye problems.  Respiratory:  Negative for chest tightness and cough.   Cardiovascular:  Negative for chest pain.  Gastrointestinal:  Negative for abdominal distention, abdominal pain and blood in stool.  Endocrine: Negative for  hot flashes.  Genitourinary:  Negative for difficulty urinating and frequency.   Musculoskeletal:  Negative for arthralgias.  Skin:  Negative for itching and rash.  Neurological:  Negative for extremity weakness.  Hematological:  Negative for adenopathy.  Psychiatric/Behavioral:  Negative for confusion.     MEDICAL HISTORY:  Past Medical History:  Diagnosis Date   AR (allergic rhinitis)    Bacterial vaginosis    Complication of anesthesia 2008   PT STATES THAT DURING C-SECTION SHE WAS ADMINISTERED A GAS THAT CAUSED HER HEART TO BEAT IRREGULAR- PT HAS HAD NO PROBLEMS WITH THIS SINCE 2008   Dysrhythmia    DUE TO A GAS THAT WAS ADMINISTERED DURING A 2008 C-SECTION-NO PROBLEMS SINCE   Fibroid    GERD (gastroesophageal reflux disease)    OCC   Hypertension    IDA (iron deficiency anemia) 01/25/2015   UTI (lower urinary tract infection)     SURGICAL HISTORY: Past Surgical History:  Procedure Laterality Date   CESAREAN SECTION     X2   DILATION AND CURETTAGE OF UTERUS     DILITATION & CURRETTAGE/HYSTROSCOPY WITH NOVASURE ABLATION N/A 07/23/2016   Procedure: DILATATION & CURETTAGE/HYSTEROSCOPY WITH NOVASURE ABLATION;  Surgeon: Brayton Mars, MD;  Location: [facility] ORS;  Service: Gynecology;  Laterality: N/A;   LAPAROSCOPIC VAGINAL HYSTERECTOMY WITH SALPINGECTOMY Bilateral 03/24/2018   Procedure: LAPAROSCOPIC ASSISTED VAGINAL HYSTERECTOMY WITH BILATERAL SALPINGECTOMY;  Surgeon: Brayton Mars, MD;  Location: [facility] ORS;  Service: Gynecology;  Laterality: Bilateral;    SOCIAL HISTORY: Social History   Socioeconomic History   Marital status: Single    Spouse name: Not on file   Number of children: Not on file   Years of education: Not on file   Highest education  level: Not on file  Occupational History   Not on file  Tobacco Use   Smoking status: Every Day    Packs/day: 0.50    Years: 25.00    Total pack years: 12.50    Types: Cigarettes    Start date: 07/03/1992    Smokeless tobacco: Never  Vaping Use   Vaping Use: Never used  Substance and Sexual Activity   Alcohol use: Yes    Comment: occasional   Drug use: Not Currently    Comment: once a week   Sexual activity: Yes    Birth control/protection: None    Comment: Ablation  Other Topics Concern   Not on file  Social History Narrative   She is a Emergency planning/management officer, 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, 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    Mother moved in with her   Social Determinants of Health   Financial Resource Strain: Medium Risk (09/10/2019)   Overall Financial Resource Strain (CARDIA)    Difficulty of Paying Living Expenses: Somewhat hard  Food Insecurity: No Food Insecurity (09/10/2019)   Hunger Vital Sign    Worried About Running Out of Food in the Last Year: Never true    [place] in the Last Year: Never true  Transportation Needs: No Transportation Needs (09/10/2019)   PRAPARE - Hydrologist (Medical): No    Lack of Transportation (Non-Medical): No  Physical Activity: Insufficiently Active (09/10/2019)   Exercise Vital Sign    Days of Exercise per Week: 1 day    Minutes of Exercise per Session: 40 min  Stress: No Stress Concern Present (09/10/2019)   [place]    Feeling of Stress : Not at all  Social Connections: Moderately Integrated (09/10/2019)   Social Connection and Isolation Panel [NHANES]    Frequency of Communication with Friends and Family: More than three times a week    Frequency of Social Gatherings with Friends and Family: More than three times a week    Attends Religious Services: More than 4 times per year    Active Member of [employer] or Organizations: Yes    Attends Archivist Meetings: More than 4 times per year    Marital Status: Never married  Intimate Partner Violence: Not At Risk (09/10/2019)   Humiliation, Afraid, Rape, and Kick  questionnaire    Fear of Current or Ex-Partner: No    Emotionally Abused: No    Physically Abused: No    Sexually Abused: No    FAMILY HISTORY: Family History  Problem Relation Age of Onset   Hypertension Mother    Thyroid disease Mother    Lung cancer Mother    Asthma Mother    Diabetes Father    Hypertension Father    Hypertension Brother    Breast cancer Maternal Aunt        great   Heart disease Maternal Aunt    Crohn's disease Maternal Aunt    Diabetes Maternal Grandmother    Ovarian cancer Neg Hx     ALLERGIES:  is allergic to hepatitis b virus vaccines.  MEDICATIONS:  Current Outpatient Medications  Medication Sig Dispense Refill   acetaminophen (TYLENOL) 325 MG tablet Take 650 mg by mouth every 6 (six) hours as needed.     fluticasone (FLONASE) 50 MCG/ACT nasal spray Place 2 sprays into both nostrils daily. 16 g 0   No current  facility-administered medications for this visit.     PHYSICAL EXAMINATION: ECOG PERFORMANCE STATUS: 0 - Asymptomatic Vitals:   03/23/22 1148  BP: (!) 185/119  Pulse: 76  Resp: 18  Temp: 97.7 F (36.5 C)   Filed Weights   03/23/22 1148  Weight: 224 lb 6.4 oz (101.8 kg)    Physical Exam Constitutional:      General: She is not in acute distress. HENT:     Head: Normocephalic and atraumatic.  Eyes:     General: No scleral icterus. Cardiovascular:     Rate and Rhythm: Normal rate and regular rhythm.     Heart sounds: Normal heart sounds.  Pulmonary:     Effort: Pulmonary effort is normal. No respiratory distress.     Breath sounds: Normal breath sounds. No wheezing.  Abdominal:     General: Bowel sounds are normal. There is no distension.     Palpations: Abdomen is soft.  Musculoskeletal:        General: No deformity. Normal range of motion.     Cervical back: Normal range of motion and neck supple.  Skin:    General: Skin is warm and dry.     Findings: No erythema or rash.  Neurological:     Mental Status: She is  alert and oriented to person, place, and time. Mental status is at baseline.     Cranial Nerves: No cranial nerve deficit.     Coordination: Coordination normal.  Psychiatric:        Mood and Affect: Mood normal.     LABORATORY DATA:  I have reviewed the data as listed Lab Results  Component Value Date   WBC 12.9 (H) 03/23/2022   HGB 14.1 03/23/2022   HCT 40.7 03/23/2022   MCV 90.0 03/23/2022   PLT 317 03/23/2022   Recent Labs    01/28/22 0857 01/29/22 0348 03/12/22 1443  NA 130* 135 139  K 3.1* 3.5 3.8  CL 93* 102 107  CO2 '24 25 24  '$ GLUCOSE 157* 105* 96  BUN 21* 12 9  CREATININE 1.11* 0.79 0.65  CALCIUM 8.4* 8.2* 9.4  GFRNONAA >60 >60  --   PROT 7.9 5.9* 6.5  ALBUMIN 3.3* 2.5*  --   AST 61* 58* 15  ALT 34 32 15  ALKPHOS 56 40  --   BILITOT 0.5 0.3 0.4   Iron/TIBC/Ferritin/ %Sat    Component Value Date/Time   IRON 134 12/04/2018 1407   IRON 82 08/02/2017 1205   TIBC 360 12/04/2018 1407   TIBC 453 (H) 08/02/2017 1205   FERRITIN 48 12/04/2018 1407   FERRITIN 12 08/02/2017 1205   IRONPCTSAT 37 12/04/2018 1407   IRONPCTSAT 18 (L) 08/02/2017 1205      RADIOGRAPHIC STUDIES: I have personally reviewed the radiological images as listed and agreed with the findings in the report. DG Chest 2 View  Result Date: 03/13/2022 CLINICAL DATA:  51 year old female with pneumonia follow-up EXAM: CHEST - 2 VIEW COMPARISON:  Chest x-ray 01/28/2022 FINDINGS: Cardiac diameter unchanged. Fullness of the right paratracheal soft tissues, which was previously a more vertical contour and now has become more convex. No pneumothorax or pleural effusion. No confluent airspace disease, with overall improved aeration in the lungs. No interlobular septal thickening. Decreased thickening of the fissure. No displaced fracture. IMPRESSION: Improved aeration of the lungs, however, there is increased fullness in the right paratracheal soft tissues indicating persisting lymphadenopathy. Either  interval follow-up chest x-ray or repeat chest CT recommended. Electronically Signed  By: Corrie Mckusick D.O.   On: 03/13/2022 08:17      ASSESSMENT & PLAN:  1. Thoracic lymphadenopathy   2. Other elevated white blood cell (WBC) count   3. Tobacco use    #Persistent paratracheal soft tissue, reactive versus malignant. I agree with CT chest recommended with contrast study for further evaluation.  # Leukocytosis is chronic, likely reactive due to smoking. Rule out other etiologies. Check peripheral flowcytometry, hepatitis #Tobacco use, smoking cessation discussed.  Orders Placed This Encounter  Procedures   CBC with Differential/Platelet    Standing Status:   Future    Number of Occurrences:   1    Standing Expiration Date:   03/24/2023   Flow cytometry panel-leukemia/lymphoma work-up    Standing Status:   Future    Number of Occurrences:   1    Standing Expiration Date:   03/24/2023   Hepatitis panel, acute    Standing Status:   Future    Number of Occurrences:   1    Standing Expiration Date:   03/24/2023    All questions were answered. The patient knows to call the clinic with any problems questions or concerns.  Follow up  TBD Steele Sizer, MD   Thank you for this kind referral and the opportunity to participate in the care of this patient. A copy of today's note is routed to referring provider   Earlie Server, MD, PhD [facility] Hematology Oncology 03/24/2022

## 2022-03-26 LAB — COMP PANEL: LEUKEMIA/LYMPHOMA

## 2022-03-27 ENCOUNTER — Ambulatory Visit
Admission: RE | Admit: 2022-03-27 | Discharge: 2022-03-27 | Disposition: A | Discharge status: Home / Self Care | Payer: Medicaid Other | Source: Ambulatory Visit | Attending: Family Medicine | Admitting: Family Medicine

## 2022-03-27 DIAGNOSIS — R590 Localized enlarged lymph nodes: Secondary | ICD-10-CM | POA: Insufficient documentation

## 2022-03-27 DIAGNOSIS — R9389 Abnormal findings on diagnostic imaging of other specified body structures: Secondary | ICD-10-CM | POA: Insufficient documentation

## 2022-03-27 DIAGNOSIS — I7121 Aneurysm of the ascending aorta, without rupture: Secondary | ICD-10-CM | POA: Diagnosis not present

## 2022-04-04 ENCOUNTER — Telehealth: Payer: Self-pay

## 2022-04-04 NOTE — Telephone Encounter (Signed)
Attempted to contact patient again to check on her and left another voicemail as she did not answer. Will attempt to call her again this afternoon.    Copied from [place] [phone]. Topic: General - Other >> Apr 04, 2022 10:03 AM [place] B wrote: Reason for OEC:[id number] returning call to Judson Roch, says he was checking on her. Please call back

## 2022-04-09 NOTE — Patient Instructions (Signed)
Preventive Care 40-49 Years Old, Female Preventive care refers to lifestyle choices and visits with your health care provider that can promote health and wellness. Preventive care visits are also called wellness exams. What can I expect for my preventive care visit? Counseling Your health care provider may ask you questions about your: Medical history, including: Past medical problems. Family medical history. Pregnancy history. Current health, including: Menstrual cycle. Method of birth control. Emotional well-being. Home life and relationship well-being. Sexual activity and sexual health. Lifestyle, including: Alcohol, nicotine or tobacco, and drug use. Access to firearms. Diet, exercise, and sleep habits. Work and work environment. Sunscreen use. Safety issues such as seatbelt and bike helmet use. Physical exam Your health care provider will check your: Height and weight. These may be used to calculate your BMI (body mass index). BMI is a measurement that tells if you are at a healthy weight. Waist circumference. This measures the distance around your waistline. This measurement also tells if you are at a healthy weight and may help predict your risk of certain diseases, such as type 2 diabetes and high blood pressure. Heart rate and blood pressure. Body temperature. Skin for abnormal spots. What immunizations do I need?  Vaccines are usually given at various ages, according to a schedule. Your health care provider will recommend vaccines for you based on your age, medical history, and lifestyle or other factors, such as travel or where you work. What tests do I need? Screening Your health care provider may recommend screening tests for certain conditions. This may include: Lipid and cholesterol levels. Diabetes screening. This is done by checking your blood sugar (glucose) after you have not eaten for a while (fasting). Pelvic exam and Pap test. Hepatitis B test. Hepatitis C  test. HIV (human immunodeficiency virus) test. STI (sexually transmitted infection) testing, if you are at risk. Lung cancer screening. Colorectal cancer screening. Mammogram. Talk with your health care provider about when you should start having regular mammograms. This may depend on whether you have a family history of breast cancer. BRCA-related cancer screening. This may be done if you have a family history of breast, ovarian, tubal, or peritoneal cancers. Bone density scan. This is done to screen for osteoporosis. Talk with your health care provider about your test results, treatment options, and if necessary, the need for more tests. Follow these instructions at home: Eating and drinking  Eat a diet that includes fresh fruits and vegetables, whole grains, lean protein, and low-fat dairy products. Take vitamin and mineral supplements as recommended by your health care provider. Do not drink alcohol if: Your health care provider tells you not to drink. You are pregnant, may be pregnant, or are planning to become pregnant. If you drink alcohol: Limit how much you have to 0-1 drink a day. Know how much alcohol is in your drink. In the [place] one drink equals one 12 oz bottle of beer (355 mL), one 5 oz glass of wine (148 mL), or one 1 oz glass of hard liquor (44 mL). Lifestyle Brush your teeth every morning and night with fluoride toothpaste. Floss one time each day. Exercise for at least 30 minutes 5 or more days each week. Do not use any products that contain nicotine or tobacco. These products include cigarettes, chewing tobacco, and vaping devices, such as e-cigarettes. If you need help quitting, ask your health care provider. Do not use drugs. If you are sexually active, practice safe sex. Use a condom or other form of protection to   prevent STIs. If you do not wish to become pregnant, use a form of birth control. If you plan to become pregnant, see your health care provider for a  prepregnancy visit. Take aspirin only as told by your health care provider. Make sure that you understand how much to take and what form to take. Work with your health care provider to find out whether it is safe and beneficial for you to take aspirin daily. Find healthy ways to manage stress, such as: Meditation, yoga, or listening to music. Journaling. Talking to a trusted person. Spending time with friends and family. Minimize exposure to UV radiation to reduce your risk of skin cancer. Safety Always wear your seat belt while driving or riding in a vehicle. Do not drive: If you have been drinking alcohol. Do not ride with someone who has been drinking. When you are tired or distracted. While texting. If you have been using any mind-altering substances or drugs. Wear a helmet and other protective equipment during sports activities. If you have firearms in your house, make sure you follow all gun safety procedures. Seek help if you have been physically or sexually abused. What's next? Visit your health care provider once a year for an annual wellness visit. Ask your health care provider how often you should have your eyes and teeth checked. Stay up to date on all vaccines. This information is not intended to replace advice given to you by your health care provider. Make sure you discuss any questions you have with your health care provider. Document Revised: 03/01/2021 Document Reviewed: 03/01/2021 [employer] Patient Education  [place].

## 2022-04-09 NOTE — Progress Notes (Unsigned)
Name: Jill Davidson   MRN: [id number]    DOB: Apr 05, 1971   Date:04/11/2022       Progress Note  Subjective  Chief Complaint  Chief Complaint  Patient presents with   Follow-up    HPI  Abnormal CT lung: with possible inflammatory versus infectious process and aortic aneurysm, discussed results with patient we will refer her to vascular surgeon and pulmonologist. She CAP in May but no longer having cough, fatigue or SOB. She had palpitations about one week ago that kept her up for about one hour with mild associated chest pain   Headaches: she has noticed headaches dull ache, mild a few times a week. Taking Tylenol and resolves, no photophobia, phonophobia , nausea or vomiting  Leucocytosis: seen by Dr. 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) 06/05/2018   Vasomotor symptoms due to menopause 05/06/2018   S/P laparoscopic assisted vaginal hysterectomy (LAVH) bilateral salpingectomy right oophorectomy 03/24/2018   Essential hypertension 07/25/2016   Status post endometrial ablation 07/23/2016   IDA (iron deficiency anemia) 01/25/2015    Past Surgical History:  Procedure Laterality Date   CESAREAN SECTION     X2   DILATION AND CURETTAGE OF UTERUS     DILITATION & CURRETTAGE/HYSTROSCOPY WITH NOVASURE ABLATION N/A 07/23/2016   Procedure: DILATATION & CURETTAGE/HYSTEROSCOPY WITH NOVASURE ABLATION;  Surgeon: Brayton Mars, MD;  Location: [facility] OR[facility];  Service: Gynecology;  Laterality: N/A;   LAPAROSCOPIC VAGINAL  HYSTERECTOMY WITH SALPINGECTOMY Bilateral 03/24/2018   Procedure: LAPAROSCOPIC ASSISTED VAGINAL HYSTERECTOMY WITH BILATERAL SALPINGECTOMY;  Surgeon: Brayton Mars, MD;  Location: [facility];  Service: Gynecology;  Laterality: Bilateral;    Family History  Problem Relation Age of Onset   Hypertension Mother    Thyroid disease Mother    Lung cancer Mother    Asthma Mother    Diabetes Father    Hypertension Father    Hypertension Brother    Breast cancer Maternal Aunt        great   Heart disease Maternal Aunt    Crohn's disease Maternal Aunt    Diabetes Maternal Grandmother    Ovarian cancer Neg Hx     Social History   Tobacco Use   Smoking status: Every Day    Packs/day: 0.50    Years: 25.00    Total pack years: 12.50    Types: Cigarettes    Start date: 07/03/1992   Smokeless tobacco: Never  Substance Use Topics   Alcohol use: Yes    Comment: occasional     Current Outpatient Medications:    acetaminophen (TYLENOL) 325 MG tablet, Take 650 mg by mouth every 6 (six) hours as needed., Disp: , Rfl:    buPROPion (ZYBAN) 150 MG 12 hr tablet, Take 1 tablet (150 mg total) by mouth 2 (two) times daily., Disp: 60 tablet, Rfl: 2  Allergies  Allergen Reactions   Hepatitis B Virus Vaccines     Dysarthria     I personally reviewed active problem list, medication list, allergies, family history, social  history with the patient/caregiver today.   ROS  Constitutional: Negative for fever or weight change.  Respiratory: Negative for cough and shortness of breath.   Cardiovascular: Negative for chest pain or palpitations.  Gastrointestinal: Negative for abdominal pain, no bowel changes.  Musculoskeletal: Negative for gait problem or joint swelling.  Skin: Negative for rash.  Neurological: Negative for dizziness, positive for  headache.  No other specific complaints in a complete review of systems (except as listed in HPI above).   Objective  Vitals:   04/10/22 1449   BP: 132/84  Pulse: 70  Resp: 16  Temp: 98.3 F (36.8 C)  TempSrc: Oral  SpO2: 99%  Weight: 221 lb 8 oz (100.5 kg)  Height: $Remove'5\' 4"'GfxhtfB$  (1.626 m)    Body mass index is 38.02 kg/m.  Physical Exam  Constitutional: Patient appears well-developed and well-nourished. Obese  No distress.  HEENT: head atraumatic, normocephalic, pupils equal and reactive to light, neck supple, throat within normal limits Cardiovascular: Normal rate, regular rhythm and normal heart sounds.  No murmur heard. No BLE edema. Pulmonary/Chest: Effort normal and breath sounds normal. No respiratory distress. Abdominal: Soft.  There is no tenderness. Psychiatric: Patient has a normal mood and affect. behavior is normal. Judgment and thought content normal.   Recent Results (from the past 2160 hour(s))  Comprehensive metabolic panel     Status: Abnormal   Collection Time: 01/28/22  8:57 AM  Result Value Ref Range   Sodium 130 (L) 135 - 145 mmol/L   Potassium 3.1 (L) 3.5 - 5.1 mmol/L   Chloride 93 (L) 98 - 111 mmol/L   CO2 24 22 - 32 mmol/L   Glucose, Bld 157 (H) 70 - 99 mg/dL    Comment: Glucose reference range applies only to samples taken after fasting for at least 8 hours.   BUN 21 (H) 6 - 20 mg/dL   Creatinine, Ser 1.11 (H) 0.44 - 1.00 mg/dL   Calcium 8.4 (L) 8.9 - 10.3 mg/dL   Total Protein 7.9 6.5 - 8.1 g/dL   Albumin 3.3 (L) 3.5 - 5.0 g/dL   AST 61 (H) 15 - 41 U/L   ALT 34 0 - 44 U/L   Alkaline Phosphatase 56 38 - 126 U/L   Total Bilirubin 0.5 0.3 - 1.2 mg/dL   GFR, Estimated >60 >60 mL/min    Comment: (NOTE) Calculated using the CKD-EPI Creatinine Equation (2021)    Anion gap 13 5 - 15    Comment: Performed at [facility], [address] [place], [place] [address]  CBC with Differential     Status: Abnormal   Collection Time: 01/28/22  8:57 AM  Result Value Ref Range   WBC 18.1 (H) 4.0 - 10.5 K/uL   RBC 5.07 3.87 - 5.11 MIL/uL   Hemoglobin 14.6 12.0 - 15.0 g/dL   HCT 43.1 36.0 -  46.0 %   MCV 85.0 80.0 - 100.0 fL   MCH 28.8 26.0 - 34.0 pg   MCHC 33.9 30.0 - 36.0 g/dL   RDW 13.2 11.5 - 15.5 %   Platelets 257 150 - 400 K/uL   nRBC 0.0 0.0 - 0.2 %   Neutrophils Relative % 83 %   Neutro Abs 15.1 (H) 1.7 - 7.7 K/uL   Lymphocytes Relative 11 %   Lymphs Abs 2.0 0.7 - 4.0 K/uL   Monocytes Relative 5 %   Monocytes Absolute 0.8 0.1 - 1.0 K/uL   Eosinophils Relative 0 %   Eosinophils Absolute  0.0 0.0 - 0.5 K/uL   Basophils Relative 0 %   Basophils Absolute 0.1 0.0 - 0.1 K/uL   Immature Granulocytes 1 %   Abs Immature Granulocytes 0.12 (H) 0.00 - 0.07 K/uL    Comment: Performed at [facility], [address] [place], [place] [address]  Protime-INR     Status: None   Collection Time: 01/28/22  8:57 AM  Result Value Ref Range   Prothrombin Time 14.9 11.4 - 15.2 seconds   INR 1.2 0.8 - 1.2    Comment: (NOTE) INR goal varies based on device and disease states. Performed at [facility], [address] [place], [place] [address]   APTT     Status: Abnormal   Collection Time: 01/28/22  8:57 AM  Result Value Ref Range   aPTT 40 (H) 24 - 36 seconds    Comment:        IF BASELINE aPTT IS ELEVATED, SUGGEST PATIENT RISK ASSESSMENT BE USED TO DETERMINE APPROPRIATE ANTICOAGULANT THERAPY. Performed at [facility], [address] [place], [place] [address]   hCG, quantitative, pregnancy     Status: None   Collection Time: 01/28/22  8:57 AM  Result Value Ref Range   hCG, Beta Chain, Quant, S 1 <5 mIU/mL    Comment:          GEST. AGE      CONC.  (mIU/mL)   <=1 WEEK        5 - 50     2 WEEKS       50 - 500     3 WEEKS       100 - 10,000     4 WEEKS     1,000 - 30,000     5 WEEKS     3,500 - 115,000   6-8 WEEKS     12,000 - 270,000    12 WEEKS     15,000 - 220,000        FEMALE AND NON-PREGNANT FEMALE:     LESS THAN 5 mIU/mL Performed at [facility], [place], [place] [address]   Procalcitonin - Baseline      Status: None   Collection Time: 01/28/22  8:57 AM  Result Value Ref Range   Procalcitonin 0.71 ng/mL    Comment:        Interpretation: PCT > 0.5 ng/mL and <= 2 ng/mL: Systemic infection (sepsis) is possible, but other conditions are known to elevate PCT as well. (NOTE)       Sepsis PCT Algorithm           Lower Respiratory Tract                                      Infection PCT Algorithm    ----------------------------     ----------------------------         PCT < 0.25 ng/mL                PCT < 0.10 ng/mL          Strongly encourage             Strongly discourage   discontinuation of antibiotics    initiation of antibiotics    ----------------------------     -----------------------------       PCT 0.25 - 0.50 ng/mL  PCT 0.10 - 0.25 ng/mL               OR       >80% decrease in PCT            Discourage initiation of                                            antibiotics      Encourage discontinuation           of antibiotics    ----------------------------     -----------------------------         PCT >= 0.50 ng/mL              PCT 0.26 - 0.50 ng/mL                AND       <80% decrease in PCT             Encourage initiation of                                             antibiotics       Encourage continuation           of antibiotics    ----------------------------     -----------------------------        PCT >= 0.50 ng/mL                  PCT > 0.50 ng/mL               AND         increase in PCT                  Strongly encourage                                      initiation of antibiotics    Strongly encourage escalation           of antibiotics                                     -----------------------------                                           PCT <= 0.25 ng/mL                                                 OR                                        > 80% decrease in PCT  Discontinue / Do not  initiate                                             antibiotics  Performed at [facility], [address] [place], [place] [address]   Magnesium     Status: None   Collection Time: 01/28/22  8:57 AM  Result Value Ref Range   Magnesium 2.2 1.7 - 2.4 mg/dL    Comment: Performed at [facility], [address] [place], [place] [address]  TSH     Status: None   Collection Time: 01/28/22  8:57 AM  Result Value Ref Range   TSH 0.888 0.350 - 4.500 uIU/mL    Comment: Performed by a 3rd Generation assay with a functional sensitivity of <=0.01 uIU/mL. Performed at [facility], [place], [place] [address]   Troponin I (High Sensitivity)     Status: None   Collection Time: 01/28/22  8:57 AM  Result Value Ref Range   Troponin I (High Sensitivity) 14 <18 ng/L    Comment: (NOTE) Elevated high sensitivity troponin I (hsTnI) values and significant  changes across serial measurements may suggest ACS but many other  chronic and acute conditions are known to elevate hsTnI results.  Refer to the "Links" section for chest pain algorithms and additional  guidance. Performed at [facility], [address] [place], [place] [address]   CBG monitoring, ED     Status: Abnormal   Collection Time: 01/28/22  9:02 AM  Result Value Ref Range   Glucose-Capillary 183 (H) 70 - 99 mg/dL    Comment: Glucose reference range applies only to samples taken after fasting for at least 8 hours.  Lactic acid, plasma     Status: None   Collection Time: 01/28/22 10:12 AM  Result Value Ref Range   Lactic Acid, Venous 1.4 0.5 - 1.9 mmol/L    Comment: Performed at [facility], [address] [place], [place] [address]  Blood Culture (routine x 2)     Status: None   Collection Time: 01/28/22 10:12 AM   Specimen: BLOOD  Result Value Ref Range   Specimen Description BLOOD RIGHT ANTECUBITAL    Special Requests      BOTTLES DRAWN AEROBIC AND ANAEROBIC  Blood Culture adequate volume   Culture      NO GROWTH 5 DAYS Performed at [facility], [address], [place], [place] [address]    Report Status 02/02/2022 FINAL   Blood Culture (routine x 2)     Status: None   Collection Time: 01/28/22 10:12 AM   Specimen: BLOOD  Result Value Ref Range   Specimen Description BLOOD BLOOD LEFT HAND    Special Requests      BOTTLES DRAWN AEROBIC AND ANAEROBIC Blood Culture adequate volume   Culture      NO GROWTH 5 DAYS Performed at [facility], [address], [place], [place] [address]    Report Status 02/02/2022 FINAL   Urinalysis, Complete w Microscopic     Status: Abnormal   Collection Time: 01/28/22 10:19 AM  Result Value Ref Range   Color, Urine AMBER (A) YELLOW    Comment: BIOCHEMICALS MAY BE AFFECTED BY COLOR   APPearance CLOUDY (A) CLEAR   Specific Gravity, Urine 1.023 1.005 - 1.030   pH 5.0 5.0 - 8.0  Glucose, UA NEGATIVE NEGATIVE mg/dL   Hgb urine dipstick LARGE (A) NEGATIVE   Bilirubin Urine NEGATIVE NEGATIVE   Ketones, ur 5 (A) NEGATIVE mg/dL   Protein, ur 100 (A) NEGATIVE mg/dL   Nitrite NEGATIVE NEGATIVE   Leukocytes,Ua NEGATIVE NEGATIVE   RBC / HPF 0-5 0 - 5 RBC/hpf   WBC, UA 6-10 0 - 5 WBC/hpf   Bacteria, UA MANY (A) NONE SEEN   Squamous Epithelial / LPF 0-5 0 - 5   Mucus PRESENT     Comment: Performed at [facility], [address], [place], [place] [address]  Resp Panel by RT-PCR (Flu A&B, Covid)     Status: None   Collection Time: 01/28/22 10:19 AM   Specimen: Nasopharyngeal(NP) swabs in vial transport medium  Result Value Ref Range   SARS Coronavirus 2 by RT PCR NEGATIVE NEGATIVE    Comment: (NOTE) SARS-CoV-2 target nucleic acids are NOT DETECTED.  The SARS-CoV-2 RNA is generally detectable in upper respiratory specimens during the acute phase of infection. The lowest concentration of SARS-CoV-2 viral copies this assay can detect is 138 copies/mL. A negative result does not  preclude SARS-Cov-2 infection and should not be used as the sole basis for treatment or other patient management decisions. A negative result may occur with  improper specimen collection/handling, submission of specimen other than nasopharyngeal swab, presence of viral mutation(s) within the areas targeted by this assay, and inadequate number of viral copies(<138 copies/mL). A negative result must be combined with clinical observations, patient history, and epidemiological information. The expected result is Negative.  Fact Sheet for Patients:  EntrepreneurPulse.com.au  Fact Sheet for Healthcare Providers:  IncredibleEmployment.be  This test is no t yet approved or cleared by the [place] FDA and  has been authorized for detection and/or diagnosis of SARS-CoV-2 by FDA under an Emergency Use Authorization (EUA). This EUA will remain  in effect (meaning this test can be used) for the duration of the COVID-19 declaration under Section 564(b)(1) of the Act, 21 U.S.C.section 360bbb-3(b)(1), unless the authorization is terminated  or revoked sooner.       Influenza A by PCR NEGATIVE NEGATIVE   Influenza B by PCR NEGATIVE NEGATIVE    Comment: (NOTE) The Xpert Xpress SARS-CoV-2/FLU/RSV plus assay is intended as an aid in the diagnosis of influenza from Nasopharyngeal swab specimens and should not be used as a sole basis for treatment. Nasal washings and aspirates are unacceptable for Xpert Xpress SARS-CoV-2/FLU/RSV testing.  Fact Sheet for Patients: EntrepreneurPulse.com.au  Fact Sheet for Healthcare Providers: IncredibleEmployment.be  This test is not yet approved or cleared by the [place] FDA and has been authorized for detection and/or diagnosis of SARS-CoV-2 by FDA under an Emergency Use Authorization (EUA). This EUA will remain in effect (meaning this test can be used) for the duration of  the COVID-19 declaration under Section 564(b)(1) of the Act, 21 U.S.C. section 360bbb-3(b)(1), unless the authorization is terminated or revoked.  Performed at [facility], [address], [place], [place] [address]   Urine Culture     Status: Abnormal   Collection Time: 01/28/22 11:05 AM   Specimen: Urine, Random  Result Value Ref Range   Specimen Description      URINE, RANDOM Performed at [facility], [address], [place], [place] [address]    Special Requests      NONE Performed at [facility], [address] [place], [place] [address]    Culture (A)     <  10,000 COLONIES/mL INSIGNIFICANT GROWTH Performed at [facility], [place] [address], [place], [place] [address]    Report Status 01/30/2022 FINAL   Troponin I (High Sensitivity)     Status: None   Collection Time: 01/28/22 12:58 PM  Result Value Ref Range   Troponin I (High Sensitivity) 15 <18 ng/L    Comment: (NOTE) Elevated high sensitivity troponin I (hsTnI) values and significant  changes across serial measurements may suggest ACS but many other  chronic and acute conditions are known to elevate hsTnI results.  Refer to the "Links" section for chest pain algorithms and additional  guidance. Performed at [facility], [address] [place], [place] [address]   Legionella Pneumophila Serogp 1 Ur Ag     Status: None   Collection Time: 01/28/22  2:05 PM  Result Value Ref Range   L. pneumophila Serogp 1 Ur Ag Negative Negative    Comment: (NOTE) Presumptive negative for L. pneumophila serogroup 1 antigen in urine, suggesting no recent or current infection. Legionnaires' disease cannot be ruled out since other serogroups and species may also cause disease. Performed At: [facility] [place], [place] [id number] Rush Farmer MD QD:[id number]    Source of Sample URINE, RANDOM     Comment: Performed at [facility], [address] [place], [place] [address]  Strep pneumoniae urinary antigen     Status: None   Collection Time: 01/28/22  2:05 PM  Result Value Ref Range   Strep Pneumo Urinary Antigen NEGATIVE NEGATIVE    Comment:        Infection due to S. pneumoniae cannot be absolutely ruled out since the antigen present may be below the detection limit of the test. Performed at [facility], [address] [place], [place] [address]   HIV Antibody (routine testing w rflx)     Status: None   Collection Time: 01/29/22  3:48 AM  Result Value Ref Range   HIV Screen 4th Generation wRfx Non Reactive Non Reactive    Comment: Performed at [facility], [place] [address] [place], [place] [address]  Procalcitonin     Status: None   Collection Time: 01/29/22  3:48 AM  Result Value Ref Range   Procalcitonin 0.37 ng/mL    Comment:        Interpretation: PCT (Procalcitonin) <= 0.5 ng/mL: Systemic infection (sepsis) is not likely. Local bacterial infection is possible. (NOTE)       Sepsis PCT Algorithm           Lower Respiratory Tract                                      Infection PCT Algorithm    ----------------------------     ----------------------------         PCT < 0.25 ng/mL                PCT < 0.10 ng/mL          Strongly encourage             Strongly discourage   discontinuation of antibiotics    initiation of antibiotics    ----------------------------     -----------------------------       PCT 0.25 - 0.50 ng/mL            PCT 0.10 - 0.25 ng/mL  OR       >80% decrease in PCT            Discourage initiation of                                            antibiotics      Encourage discontinuation           of antibiotics    ----------------------------     -----------------------------         PCT >= 0.50 ng/mL              PCT 0.26 - 0.50 ng/mL               AND        <80% decrease in PCT             Encourage initiation of                                              antibiotics       Encourage continuation           of antibiotics    ----------------------------     -----------------------------        PCT >= 0.50 ng/mL                  PCT > 0.50 ng/mL               AND         increase in PCT                  Strongly encourage                                      initiation of antibiotics    Strongly encourage escalation           of antibiotics                                     -----------------------------                                           PCT <= 0.25 ng/mL                                                 OR                                        > 80% decrease in PCT                                      Discontinue / Do not initiate  antibiotics  Performed at [facility], [address] [place], [place] [address]   Comprehensive metabolic panel     Status: Abnormal   Collection Time: 01/29/22  3:48 AM  Result Value Ref Range   Sodium 135 135 - 145 mmol/L   Potassium 3.5 3.5 - 5.1 mmol/L   Chloride 102 98 - 111 mmol/L   CO2 25 22 - 32 mmol/L   Glucose, Bld 105 (H) 70 - 99 mg/dL    Comment: Glucose reference range applies only to samples taken after fasting for at least 8 hours.   BUN 12 6 - 20 mg/dL   Creatinine, Ser 0.79 0.44 - 1.00 mg/dL   Calcium 8.2 (L) 8.9 - 10.3 mg/dL   Total Protein 5.9 (L) 6.5 - 8.1 g/dL   Albumin 2.5 (L) 3.5 - 5.0 g/dL   AST 58 (H) 15 - 41 U/L   ALT 32 0 - 44 U/L   Alkaline Phosphatase 40 38 - 126 U/L   Total Bilirubin 0.3 0.3 - 1.2 mg/dL   GFR, Estimated >60 >60 mL/min    Comment: (NOTE) Calculated using the CKD-EPI Creatinine Equation (2021)    Anion gap 8 5 - 15    Comment: Performed at [facility], [address] [place], [place] [address]  CBC with Differential/Platelet     Status: Abnormal   Collection Time: 01/29/22  3:48 AM  Result Value Ref Range   WBC 10.6 (H) 4.0 - 10.5 K/uL   RBC 3.97 3.87 - 5.11 MIL/uL    Hemoglobin 11.6 (L) 12.0 - 15.0 g/dL   HCT 34.2 (L) 36.0 - 46.0 %   MCV 86.1 80.0 - 100.0 fL   MCH 29.2 26.0 - 34.0 pg   MCHC 33.9 30.0 - 36.0 g/dL   RDW 13.3 11.5 - 15.5 %   Platelets 208 150 - 400 K/uL   nRBC 0.0 0.0 - 0.2 %   Neutrophils Relative % 62 %   Neutro Abs 6.6 1.7 - 7.7 K/uL   Lymphocytes Relative 28 %   Lymphs Abs 2.9 0.7 - 4.0 K/uL   Monocytes Relative 8 %   Monocytes Absolute 0.8 0.1 - 1.0 K/uL   Eosinophils Relative 0 %   Eosinophils Absolute 0.0 0.0 - 0.5 K/uL   Basophils Relative 0 %   Basophils Absolute 0.0 0.0 - 0.1 K/uL   Immature Granulocytes 2 %   Abs Immature Granulocytes 0.17 (H) 0.00 - 0.07 K/uL    Comment: Performed at [facility], [address] [place], [place] [address]  Procalcitonin     Status: None   Collection Time: 01/30/22  4:57 AM  Result Value Ref Range   Procalcitonin 0.17 ng/mL    Comment:        Interpretation: PCT (Procalcitonin) <= 0.5 ng/mL: Systemic infection (sepsis) is not likely. Local bacterial infection is possible. (NOTE)       Sepsis PCT Algorithm           Lower Respiratory Tract                                      Infection PCT Algorithm    ----------------------------     ----------------------------         PCT < 0.25 ng/mL                PCT < 0.10 ng/mL  Strongly encourage             Strongly discourage   discontinuation of antibiotics    initiation of antibiotics    ----------------------------     -----------------------------       PCT 0.25 - 0.50 ng/mL            PCT 0.10 - 0.25 ng/mL               OR       >80% decrease in PCT            Discourage initiation of                                            antibiotics      Encourage discontinuation           of antibiotics    ----------------------------     -----------------------------         PCT >= 0.50 ng/mL              PCT 0.26 - 0.50 ng/mL               AND        <80% decrease in PCT             Encourage initiation of                                              antibiotics       Encourage continuation           of antibiotics    ----------------------------     -----------------------------        PCT >= 0.50 ng/mL                  PCT > 0.50 ng/mL               AND         increase in PCT                  Strongly encourage                                      initiation of antibiotics    Strongly encourage escalation           of antibiotics                                     -----------------------------                                           PCT <= 0.25 ng/mL                                                 OR                                        >  80% decrease in PCT                                      Discontinue / Do not initiate                                             antibiotics  Performed at [facility], [address] [place], [place] [address]   Lipid panel     Status: Abnormal   Collection Time: 01/30/22  4:57 AM  Result Value Ref Range   Cholesterol 134 0 - 200 mg/dL   Triglycerides 226 (H) <150 mg/dL   HDL <10 (L) >40 mg/dL   Total CHOL/HDL Ratio NOT CALCULATED RATIO   VLDL 45 (H) 0 - 40 mg/dL   LDL Cholesterol NOT CALCULATED 0 - 99 mg/dL    Comment: Performed at [facility], [address] [place], [place] [id number]  CBC     Status: Abnormal   Collection Time: 01/30/22  4:57 AM  Result Value Ref Range   WBC 8.7 4.0 - 10.5 K/uL   RBC 3.79 (L) 3.87 - 5.11 MIL/uL   Hemoglobin 11.1 (L) 12.0 - 15.0 g/dL   HCT 33.1 (L) 36.0 - 46.0 %   MCV 87.3 80.0 - 100.0 fL   MCH 29.3 26.0 - 34.0 pg   MCHC 33.5 30.0 - 36.0 g/dL   RDW 13.6 11.5 - 15.5 %   Platelets 234 150 - 400 K/uL   nRBC 0.0 0.0 - 0.2 %    Comment: Performed at [facility], [address] [place],  [address]  ECHOCARDIOGRAM COMPLETE     Status: None   Collection Time: 01/30/22 10:20 AM  Result Value Ref Range   Weight 3,520 oz   Height 63 in   BP 144/91 mmHg   Ao pk vel  1.18 m/s   AV Area VTI 2.25 cm2   AR max vel 2.40 cm2   AV Mean grad 3.0 mmHg   AV Peak grad 5.6 mmHg   S' Lateral 3.28 cm   AV Area mean vel 2.54 cm2   Area-P 1/2 3.66 cm2   MV VTI 1.32 cm2  Acetylcholine receptor, binding     Status: None   Collection Time: 01/30/22  5:48 PM  Result Value Ref Range   Acety choline binding ab <0.03 0.00 - 0.24 nmol/L    Comment: (NOTE)                               Negative:   0.00 - 0.24                               Borderline: 0.25 - 0.40                               Positive:         >0.40 Performed At: [facility] [address] [place], [place] [id number] Rush Farmer MD SV:[id number]   COMPLETE METABOLIC PANEL WITH GFR     Status: None   Collection Time: 03/12/22  2:43 PM  Result Value Ref Range  Glucose, Bld 96 65 - 139 mg/dL    Comment: .        Non-fasting reference interval .    BUN 9 7 - 25 mg/dL   Creat 0.65 0.50 - 0.99 mg/dL   eGFR 109 > OR = 60 mL/min/1.61m2    Comment: The eGFR is based on the CKD-EPI 2021 equation. To calculate  the new eGFR from a previous Creatinine or Cystatin C result, go to https://www.kidney.org/professionals/ kdoqi/gfr%5Fcalculator    BUN/Creatinine Ratio NOT APPLICABLE 6 - 22 (calc)   Sodium 139 135 - 146 mmol/L   Potassium 3.8 3.5 - 5.3 mmol/L   Chloride 107 98 - 110 mmol/L   CO2 24 20 - 32 mmol/L   Calcium 9.4 8.6 - 10.2 mg/dL   Total Protein 6.5 6.1 - 8.1 g/dL   Albumin 3.9 3.6 - 5.1 g/dL   Globulin 2.6 1.9 - 3.7 g/dL (calc)   AG Ratio 1.5 1.0 - 2.5 (calc)   Total Bilirubin 0.4 0.2 - 1.2 mg/dL   Alkaline phosphatase (APISO) 62 31 - 125 U/L   AST 15 10 - 35 U/L   ALT 15 6 - 29 U/L  CBC with Differential/Platelet     Status: Abnormal   Collection Time: 03/12/22  2:43 PM  Result Value Ref Range   WBC 11.7 (H) 3.8 - 10.8 Thousand/uL   RBC 4.53 3.80 - 5.10 Million/uL   Hemoglobin 13.8 11.7 - 15.5 g/dL   HCT 40.5 35.0 - 45.0 %   MCV 89.4 80.0 - 100.0 fL   MCH 30.5 27.0 -  33.0 pg   MCHC 34.1 32.0 - 36.0 g/dL   RDW 13.4 11.0 - 15.0 %   Platelets 357 140 - 400 Thousand/uL   MPV 9.7 7.5 - 12.5 fL   Neutro Abs 5,043 1,500 - 7,800 cells/uL   Lymphs Abs 5,850 (H) 850 - 3,900 cells/uL   Absolute Monocytes 597 200 - 950 cells/uL   Eosinophils Absolute 164 15 - 500 cells/uL   Basophils Absolute 47 0 - 200 cells/uL   Neutrophils Relative % 43.1 %   Total Lymphocyte 50.0 %   Monocytes Relative 5.1 %   Eosinophils Relative 1.4 %   Basophils Relative 0.4 %  Hepatitis panel, acute     Status: None   Collection Time: 03/23/22 12:17 PM  Result Value Ref Range   Hepatitis B Surface Ag NON REACTIVE NON REACTIVE   HCV Ab NON REACTIVE NON REACTIVE    Comment: (NOTE) Nonreactive HCV antibody screen is consistent with no HCV infections,  unless recent infection is suspected or other evidence exists to indicate HCV infection.     Hep A IgM NON REACTIVE NON REACTIVE   Hep B C IgM NON REACTIVE NON REACTIVE    Comment: Performed at [facility], [place] [address], [place], [place] [address]  Flow cytometry panel-leukemia/lymphoma work-up     Status: None   Collection Time: 03/23/22 12:17 PM  Result Value Ref Range   PATH INTERP XXX-IMP Comment     Comment: (NOTE) No significant immunophenotypic abnormality detected Lymphocytosis, see comment.    ANNOTATION COMMENT IMP Comment     Comment: (NOTE) The lymphocytosis is due to an increase in CD4+ T cells, CD8+ T cells and B cells. Increases in multiple lymphocyte subsets suggest a reactive process. Clinical correlation is recommended.    CLINICAL INFO Comment     Comment: (NOTE) Accompanying CBC dated 03/23/2022 shows: WBC count 12.9, Neu 6.1, Lym 6.0,  Mon 0.6.    Specimen Type Comment     Comment: Peripheral blood   ASSESSMENT OF LEUKOCYTES Comment     Comment: (NOTE) No monoclonal B cell population is detected. kappa:lambda ratio 1.3. There is no loss of, or aberrant expression of, the pan T  cell antigens to suggest a neoplastic T cell process. CD4:CD8 ratio 1.1. CD57 positive cells are relatively increased and are composed of a mixture of few CD4 positive T cells, many CD8 positive T cells and some NK cells, most consistent with a reactive process. CD57 is a marker of large granular lymphocytes. Clinical correlation is recommended. No circulating blasts are detected. There is no immunophenotypic  evidence of abnormal myeloid maturation. Analysis of the leukocyte population shows: granulocytes 52%, monocytes 4%, lymphocytes 44%, blasts <0.1%, B cells 9%, T cells 32%, LGLs 11%, NK cells 3%.    % Viable Cells Comment     Comment: 97%   ANALYSIS AND GATING STRATEGY Comment     Comment: (NOTE) 8 color analysis with CD45/SSC gating ZEBNC Performed at [facility], [address] [place], [place] [address]    IMMUNOPHENOTYPING STUDY Comment     Comment: (NOTE) CD2       Normal         CD3       Normal CD4       Normal         CD5       Normal CD7       Normal         CD8       Normal CD10      Normal         CD11b     Normal CD13      Normal         CD14      Normal CD16      Normal         CD19      Normal CD20      Normal         CD33      Normal CD34      Normal         CD38      Normal CD45      Normal         CD56      Normal CD57      Normal         CD117     Normal HLA-DR    Normal         KAPPA     Normal LAMBDA    Normal         CD64      Normal    PATHOLOGIST NAME Comment     Comment: Smiley Houseman, M.D.   COMMENT: Comment     Comment: (NOTE) Each antibody in this assay was utilized to assess for potential abnormalities of studied cell populations or to characterize identified abnormalities. This test was developed and its performance characteristics determined by [facility].  It has not been cleared or approved by the U.S. Food and Drug Administration. The FDA has determined that such clearance or approval is not necessary. This test is used  for clinical purposes.  It should not be regarded as investigational or for research. Performed At: [facility] [address] [place], [place] [id number] Katina Degree MDPhD ZL:[id number] Performed At: [place], [place] [id number] Chenn  Anjen MDPhD UP:[id number] Performed At: [facility] [place], [place] [id number] Katina Degree MD [id number]   CBC with Differential/Platelet     Status: Abnormal   Collection Time: 03/23/22 12:17 PM  Result Value Ref Range   WBC 12.9 (H) 4.0 - 10.5 K/uL   RBC 4.52 3.87 - 5.11 MIL/uL   Hemoglobin 14.1 12.0 - 15.0 g/dL   HCT 40.7 36.0 - 46.0 %   MCV 90.0 80.0 - 100.0 fL   MCH 31.2 26.0 - 34.0 pg   MCHC 34.6 30.0 - 36.0 g/dL   RDW 13.4 11.5 - 15.5 %   Platelets 317 150 - 400 K/uL   nRBC 0.0 0.0 - 0.2 %   Neutrophils Relative % 47 %   Neutro Abs 6.1 1.7 - 7.7 K/uL   Lymphocytes Relative 46 %   Lymphs Abs 6.0 (H) 0.7 - 4.0 K/uL   Monocytes Relative 5 %   Monocytes Absolute 0.6 0.1 - 1.0 K/uL   Eosinophils Relative 1 %   Eosinophils Absolute 0.2 0.0 - 0.5 K/uL   Basophils Relative 1 %   Basophils Absolute 0.1 0.0 - 0.1 K/uL   Immature Granulocytes 0 %   Abs Immature Granulocytes 0.03 0.00 - 0.07 K/uL    Comment: Performed at [facility], [address] [place], [place] [address]      PHQ2/9:    04/10/2022    2:42 PM 03/14/2022    9:39 AM 02/05/2022    9:22 AM 09/30/2020    2:46 PM 09/28/2020    8:02 AM  Depression screen PHQ 2/9  Decreased Interest 0 0 3 0 1  Down, Depressed, Hopeless 0 0 3 0 0  PHQ - 2 Score 0 0 6 0 1  Altered sleeping 0 0 3 2 0  Tired, decreased energy 0 $Remove'3 3 2 1  'vsBJUfB$ Change in appetite 0 3 1 0 1  Feeling bad or failure about yourself  0 0 0 0 0  Trouble concentrating 0 0 1 0 0  Moving slowly or fidgety/restless 0 0 0 0 0  Suicidal thoughts 0 0 0 0 0  PHQ-9 Score 0 $Remov'6 14 4 3  'eoWLsp$ Difficult doing work/chores Not difficult at all   Not difficult at all     phq  9 is negative   Fall Risk:    04/10/2022    2:42 PM 03/14/2022    9:35 AM 02/05/2022    9:22 AM 09/30/2020    2:45 PM 09/28/2020    8:02 AM  Fall Risk   Falls in the past year? $RemoveBe'1 1 1 'QFImeWngR$ 0 0  Number falls in past yr: $Remove'1 1 1 'xXMWeDp$ 0 0  Injury with Fall? 0 0 0 0 0  Risk for fall due to : Impaired balance/gait;Impaired mobility No Fall Risks No Fall Risks    Follow up Falls prevention discussed;Education provided Falls prevention discussed Falls prevention discussed        Functional Status Survey: Is the patient deaf or have difficulty hearing?: No Does the patient have difficulty seeing, even when wearing glasses/contacts?: Yes Does the patient have difficulty concentrating, remembering, or making decisions?: No Does the patient have difficulty walking or climbing stairs?: No Does the patient have difficulty dressing or bathing?: No Does the patient have difficulty doing errands alone such as visiting a doctor's office or shopping?: No    Assessment & Plan  1. Aneurysm of ascending aorta without rupture [facility])  - Ambulatory referral to Vascular Surgery  2.Palpitation  - Ambulatory referral to Cardiology  3. Abnormal CT scan of lung  - Ambulatory referral to Pulmonology  4. History of pneumonia  - Ambulatory referral to Pulmonology   5. Encounter for tobacco use cessation counseling  - buPROPion (ZYBAN) 150 MG 12 hr tablet; Take 1 tablet (150 mg total) by mouth 2 (two) times daily.  Dispense: 60 tablet; Refill: 2

## 2022-04-10 ENCOUNTER — Encounter: Payer: Self-pay | Admitting: Family Medicine

## 2022-04-10 ENCOUNTER — Ambulatory Visit (INDEPENDENT_AMBULATORY_CARE_PROVIDER_SITE_OTHER): Payer: Medicaid Other | Admitting: Family Medicine

## 2022-04-10 VITALS — BP 132/84 | HR 70 | Temp 98.3°F | Resp 16 | Ht 64.0 in | Wt 221.5 lb

## 2022-04-10 DIAGNOSIS — R918 Other nonspecific abnormal finding of lung field: Secondary | ICD-10-CM

## 2022-04-10 DIAGNOSIS — Z8701 Personal history of pneumonia (recurrent): Secondary | ICD-10-CM

## 2022-04-10 DIAGNOSIS — Z0000 Encounter for general adult medical examination without abnormal findings: Secondary | ICD-10-CM

## 2022-04-10 DIAGNOSIS — Z716 Tobacco abuse counseling: Secondary | ICD-10-CM | POA: Diagnosis not present

## 2022-04-10 DIAGNOSIS — R002 Palpitations: Secondary | ICD-10-CM | POA: Insufficient documentation

## 2022-04-10 DIAGNOSIS — I7121 Aneurysm of the ascending aorta, without rupture: Secondary | ICD-10-CM | POA: Diagnosis not present

## 2022-04-10 MED ORDER — BUPROPION HCL ER (SMOKING DET) 150 MG PO TB12: 150.0000 mg | ORAL_TABLET | Freq: Two times a day (BID) | ORAL | 2 refills | Status: DC

## 2022-04-30 ENCOUNTER — Ambulatory Visit (INDEPENDENT_AMBULATORY_CARE_PROVIDER_SITE_OTHER): Payer: Medicaid Other | Admitting: Nurse Practitioner

## 2022-04-30 ENCOUNTER — Encounter (INDEPENDENT_AMBULATORY_CARE_PROVIDER_SITE_OTHER): Payer: Self-pay | Admitting: Nurse Practitioner

## 2022-04-30 VITALS — BP 178/116 | HR 66 | Resp 16 | Wt 224.2 lb

## 2022-04-30 DIAGNOSIS — Z720 Tobacco use: Secondary | ICD-10-CM | POA: Diagnosis not present

## 2022-04-30 DIAGNOSIS — I7121 Aneurysm of the ascending aorta, without rupture: Secondary | ICD-10-CM

## 2022-04-30 DIAGNOSIS — I10 Essential (primary) hypertension: Secondary | ICD-10-CM | POA: Diagnosis not present

## 2022-04-30 NOTE — Progress Notes (Signed)
Subjective:    Patient ID: Jill Davidson, female    DOB: 1971/08/31, 51 y.o.   MRN: [id number] Chief Complaint  Patient presents with   New Patient (Initial Visit)    Ref [facility] consult ascending aorta aneurysm 4.1    Kalin Amrhein is a 51 year old female who presents today for evaluation of an ascending aortic aneurysm.  This was found incidentally following a CT scan for paratracheal lymphadenopathy.  The CT report notes that the abdominal aortic aneurysm is approximately 4.1 cm however manual review shows that it is closer to 4.8 cm.  She currently denies any chest pain.  She denies any signs and symptoms of distal embolization.  She denies any claudication-like symptoms or rest pain.    Review of Systems  Cardiovascular:  Negative for leg swelling.  All other systems reviewed and are negative.      Objective:   Physical Exam Vitals reviewed.  HENT:     Head: Normocephalic.  Cardiovascular:     Rate and Rhythm: Normal rate.     Pulses:          Popliteal pulses are 0 on the right side and 0 on the left side.       Dorsalis pedis pulses are 2+ on the right side and 2+ on the left side.       Posterior tibial pulses are 2+ on the right side and 2+ on the left side.  Pulmonary:     Effort: Pulmonary effort is normal.  Skin:    General: Skin is warm and dry.  Neurological:     Mental Status: She is alert and oriented to person, place, and time.  Psychiatric:        Mood and Affect: Mood normal.        Behavior: Behavior normal.        Thought Content: Thought content normal.        Judgment: Judgment normal.     BP (!) 178/116 (BP Location: Right Arm)   Pulse 66   Resp 16   Wt 224 lb 3.2 oz (101.7 kg)   LMP 03/21/2018 (Exact Date)   BMI 38.48 kg/m   Past Medical History:  Diagnosis Date   AR (allergic rhinitis)    Bacterial vaginosis    Complication of anesthesia 2008   PT STATES THAT DURING C-SECTION SHE WAS ADMINISTERED A GAS THAT CAUSED HER HEART TO BEAT  IRREGULAR- PT HAS HAD NO PROBLEMS WITH THIS SINCE 2008   Dysrhythmia    DUE TO A GAS THAT WAS ADMINISTERED DURING A 2008 C-SECTION-NO PROBLEMS SINCE   Fibroid    GERD (gastroesophageal reflux disease)    OCC   Hypertension    IDA (iron deficiency anemia) 01/25/2015   UTI (lower urinary tract infection)     Social History   Socioeconomic History   Marital status: Single    Spouse name: Not on file   Number of children: Not on file   Years of education: Not on file   Highest education level: Not on file  Occupational History   Not on file  Tobacco Use   Smoking status: Every Day    Packs/day: 0.50    Years: 25.00    Total pack years: 12.50    Types: Cigarettes    Start date: 07/03/1992   Smokeless tobacco: Never  Vaping Use   Vaping Use: Never used  Substance and Sexual Activity   Alcohol use: Yes    Comment: occasional  Drug use: Not Currently    Comment: once a week   Sexual activity: Yes    Birth control/protection: None    Comment: Ablation  Other Topics Concern   Not on file  Social History Narrative   She is a Emergency planning/management officer, 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, 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    Mother moved in with her   Social Determinants of Health   Financial Resource Strain: Medium Risk (09/10/2019)   Overall Financial Resource Strain (CARDIA)    Difficulty of Paying Living Expenses: Somewhat hard  Food Insecurity: No Food Insecurity (04/10/2022)   Hunger Vital Sign    Worried About Running Out of Food in the Last Year: Never true    [place] in the Last Year: Never true  Transportation Needs: No Transportation Needs (04/10/2022)   PRAPARE - Hydrologist (Medical): No    Lack of Transportation (Non-Medical): No  Physical Activity: Insufficiently Active (04/10/2022)   Exercise Vital Sign    Days of Exercise per Week: 3 days    Minutes of Exercise per Session: 20 min  Stress: No Stress Concern Present  (04/10/2022)   [place]    Feeling of Stress : Only a little  Social Connections: Unknown (04/10/2022)   Social Connection and Isolation Panel [NHANES]    Frequency of Communication with Friends and Family: Twice a week    Frequency of Social Gatherings with Friends and Family: Twice a week    Attends Religious Services: Never    Marine scientist or Organizations: No    Attends Archivist Meetings: Never    Marital Status: Not on file  Intimate Partner Violence: Not At Risk (04/10/2022)   Humiliation, Afraid, Rape, and Kick questionnaire    Fear of Current or Ex-Partner: No    Emotionally Abused: No    Physically Abused: No    Sexually Abused: No    Past Surgical History:  Procedure Laterality Date   CESAREAN SECTION     X2   DILATION AND CURETTAGE OF UTERUS     DILITATION & CURRETTAGE/HYSTROSCOPY WITH [place] N/A 07/23/2016   Procedure: DILATATION & CURETTAGE/HYSTEROSCOPY WITH NOVASURE ABLATION;  Surgeon: Brayton Mars, MD;  Location: [facility] ORS;  Service: Gynecology;  Laterality: N/A;   LAPAROSCOPIC VAGINAL HYSTERECTOMY WITH SALPINGECTOMY Bilateral 03/24/2018   Procedure: LAPAROSCOPIC ASSISTED VAGINAL HYSTERECTOMY WITH BILATERAL SALPINGECTOMY;  Surgeon: Brayton Mars, MD;  Location: [facility] ORS;  Service: Gynecology;  Laterality: Bilateral;    Family History  Problem Relation Age of Onset   Hypertension Mother    Thyroid disease Mother    Lung cancer Mother    Asthma Mother    Diabetes Father    Hypertension Father    Hypertension Brother    Breast cancer Maternal Aunt        great   Heart disease Maternal Aunt    Crohn's disease Maternal Aunt    Diabetes Maternal Grandmother    Ovarian cancer Neg Hx     Allergies  Allergen Reactions   Hepatitis B Virus Vaccines     Dysarthria        Latest Ref Rng & Units 03/23/2022   12:17 PM 03/12/2022    2:43 PM 01/30/2022     4:57 AM  CBC  WBC 4.0 - 10.5 K/uL 12.9  11.7  8.7   Hemoglobin 12.0 -  15.0 g/dL 14.1  13.8  11.1   Hematocrit 36.0 - 46.0 % 40.7  40.5  33.1   Platelets 150 - 400 K/uL 317  357  234       CMP     Component Value Date/Time   NA 139 03/12/2022 1443   NA 137 08/02/2017 1206   K 3.8 03/12/2022 1443   K 3.6 08/02/2017 1206   CL 107 03/12/2022 1443   CO2 24 03/12/2022 1443   CO2 23 08/02/2017 1206   GLUCOSE 96 03/12/2022 1443   GLUCOSE 86 08/02/2017 1206   BUN 9 03/12/2022 1443   BUN 8.8 08/02/2017 1206   CREATININE 0.65 03/12/2022 1443   CREATININE 0.7 08/02/2017 1206   CALCIUM 9.4 03/12/2022 1443   CALCIUM 8.9 08/02/2017 1206   PROT 6.5 03/12/2022 1443   PROT 7.2 08/02/2017 1206   ALBUMIN 2.5 (L) 01/29/2022 0348   ALBUMIN 3.5 08/02/2017 1206   AST 15 03/12/2022 1443   AST 16 02/24/2018 1028   AST 16 08/02/2017 1206   ALT 15 03/12/2022 1443   ALT 11 02/24/2018 1028   ALT 13 08/02/2017 1206   ALKPHOS 40 01/29/2022 0348   ALKPHOS 52 08/02/2017 1206   BILITOT 0.4 03/12/2022 1443   BILITOT 0.3 02/24/2018 1028   BILITOT 0.53 08/02/2017 1206   GFRNONAA >60 01/29/2022 0348   GFRNONAA 105 09/05/2020 1530   GFRAA 121 09/05/2020 1530     No results found.     Assessment & Plan:   1. Aneurysm of ascending aorta without rupture (HCC) Today the patient has evidence of an ascending aortic aneurysm of 4.8 cm.  Repair of ascending aortic aneurysm to close to about 6 cm.  This would typically need to be done by CT surgery but we can continue to follow until she drops closer to the size.  There is also concern given the patient's smoking history of an abdominal aortic aneurysm.  We will have the patient return at her convenience for studies to evaluate for abdominal aortic aneurysm.  Patient is advised that the best course of treatment is smoking cessation as well as blood pressure control.  Recommended that she begin a daily baby aspirin.  2. Tobacco user The use of tobacco  encourages growth of aneurysms.  Patient is recommended to go see smoking to delay growth.  3. Essential hypertension Currently the patient is not taking any medication for hypertension.  Her blood pressure is elevated today but as noted in her records and by the patient it has been not consistently elevated at office visits patient is advised to take her blood pressure at home so that she may be able to trend her blood pressure levels to see if she is consistently elevated or she may need to restart her antihypertensives which were stopped during her recent hospitalization.   Current Outpatient Medications on File Prior to Visit  Medication Sig Dispense Refill   acetaminophen (TYLENOL) 325 MG tablet Take 650 mg by mouth every 6 (six) hours as needed.     buPROPion (ZYBAN) 150 MG 12 hr tablet Take 1 tablet (150 mg total) by mouth 2 (two) times daily. 60 tablet 2   No current facility-administered medications on file prior to visit.    There are no Patient Instructions on file for this visit. No follow-ups on file.   Kris Hartmann, NP

## 2022-05-08 NOTE — Progress Notes (Unsigned)
Name: Jill Davidson   MRN: [id number]    DOB: 1970/09/20   Date:05/09/2022       Progress Note  Subjective  Chief Complaint  Annual Exam  HPI  Patient presents for annual CPE.  Diet: she is skipping meals during the day, she still eats fried chicken, she has been eating a lot of fruit but should increase vegetables.  Exercise: continue regular physical activity   Last Eye Exam: not up do date  Last Dental Exam: last visit was earlier 2023  [place] Office Visit from 04/10/2022 in [facility]  AUDIT-C Score 0      Depression: Phq 9 is  negative    05/09/2022   10:15 AM 04/10/2022    2:42 PM 03/14/2022    9:39 AM 02/05/2022    9:22 AM 09/30/2020    2:46 PM  Depression screen PHQ 2/9  Decreased Interest 0 0 0 3 0  Down, Depressed, Hopeless 0 0 0 3 0  PHQ - 2 Score 0 0 0 6 0  Altered sleeping 3 0 0 3 2  Tired, decreased energy 3 0 '3 3 2  ' Change in appetite 3 0 3 1 0  Feeling bad or failure about yourself  0 0 0 0 0  Trouble concentrating 0 0 0 1 0  Moving slowly or fidgety/restless 0 0 0 0 0  Suicidal thoughts 0 0 0 0 0  PHQ-9 Score 9 0 '6 14 4  ' Difficult doing work/chores  Not difficult at all   Not difficult at all   Hypertension: BP Readings from Last 3 Encounters:  05/09/22 (!) 142/86  04/30/22 (!) 178/116  04/10/22 132/84   Obesity: Wt Readings from Last 3 Encounters:  05/09/22 225 lb (102.1 kg)  04/30/22 224 lb 3.2 oz (101.7 kg)  04/10/22 221 lb 8 oz (100.5 kg)   BMI Readings from Last 3 Encounters:  05/09/22 39.86 kg/m  04/30/22 38.48 kg/m  04/10/22 38.02 kg/m     Vaccines:   HPV: N/A Tdap: up to date Shingrix: N/A Pneumonia: N/A Flu: today  COVID-19: up to date   Hep C Screening: 03/23/22 STD testing and prevention (HIV/chl/gon/syphilis): 01/29/22 Intimate partner violence: negative screen  Sexual History : same partner, no pain or discomfort  Menstrual History/LMP/Abnormal Bleeding: s/p hysterectomy  Discussed  importance of follow up if any post-menopausal bleeding: not applicable  Incontinence Symptoms: negative for symptoms   Breast cancer:  - Last Mammogram: 07/08/20, due for repeat  - BRCA gene screening: N/A  Osteoporosis Prevention : Discussed high calcium and vitamin D supplementation, weight bearing exercises Bone density: not applicable   Cervical cancer screening: N/A  Skin cancer: Discussed monitoring for atypical lesions  Colorectal cancer: discussed options and she would like to do FIT test    ECG: 01/28/22  Advanced Care Planning: A voluntary discussion about advance care planning including the explanation and discussion of advance directives.  Discussed health care proxy and Living will, and the patient was able to identify a health care proxy as mother .  Patient does not have a living will and power of attorney of health care   Lipids: Lab Results  Component Value Date   CHOL 134 01/30/2022   CHOL 159 09/05/2020   CHOL 146 06/03/2017   Lab Results  Component Value Date   HDL <10 (L) 01/30/2022   HDL 39 (L) 09/05/2020   HDL 44 (L) 06/03/2017   Lab Results  Component Value Date  [place] NOT CALCULATED 01/30/2022   LDLCALC 101 (H) 09/05/2020   LDLCALC 86 06/03/2017   Lab Results  Component Value Date   TRIG 226 (H) 01/30/2022   TRIG 94 09/05/2020   TRIG 71 06/03/2017   Lab Results  Component Value Date   CHOLHDL NOT CALCULATED 01/30/2022   CHOLHDL 4.1 09/05/2020   CHOLHDL 3.3 06/03/2017   No results found for: "LDLDIRECT"  Glucose: Glucose  Date Value Ref Range Status  08/02/2017 86 70 - 140 mg/dl Final    Comment:    Glucose reference range is for nonfasting patients. Fasting glucose reference range is 70- 100.   Glucose, Bld  Date Value Ref Range Status  03/12/2022 96 65 - 139 mg/dL Final    Comment:    .        Non-fasting reference interval .   01/29/2022 105 (H) 70 - 99 mg/dL Final    Comment:    Glucose reference range applies only to  samples taken after fasting for at least 8 hours.  01/28/2022 157 (H) 70 - 99 mg/dL Final    Comment:    Glucose reference range applies only to samples taken after fasting for at least 8 hours.   Glucose-Capillary  Date Value Ref Range Status  01/28/2022 183 (H) 70 - 99 mg/dL Final    Comment:    Glucose reference range applies only to samples taken after fasting for at least 8 hours.    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) 06/05/2018   Vasomotor symptoms due to menopause 05/06/2018   S/P laparoscopic assisted vaginal hysterectomy (LAVH) bilateral salpingectomy right oophorectomy 03/24/2018   Hypertension, benign 07/25/2016   Status post endometrial ablation 07/23/2016   IDA (iron deficiency anemia) 01/25/2015    Past Surgical History:  Procedure Laterality Date   CESAREAN SECTION     X2   DILATION AND CURETTAGE OF UTERUS     DILITATION & CURRETTAGE/HYSTROSCOPY WITH NOVASURE ABLATION N/A 07/23/2016   Procedure: DILATATION & CURETTAGE/HYSTEROSCOPY WITH NOVASURE ABLATION;  Surgeon: Brayton Mars, MD;  Location: [facility] ORS;  Service: Gynecology;  Laterality: N/A;   LAPAROSCOPIC VAGINAL HYSTERECTOMY WITH SALPINGECTOMY Bilateral 03/24/2018   Procedure: LAPAROSCOPIC ASSISTED VAGINAL HYSTERECTOMY WITH BILATERAL SALPINGECTOMY;  Surgeon: Brayton Mars, MD;  Location: [facility] ORS;  Service: Gynecology;  Laterality: Bilateral;    Family History  Problem Relation Age of Onset   Hypertension Mother    Thyroid disease Mother    Lung cancer Mother    Asthma Mother    Diabetes Father     Hypertension Father    Hypertension Brother    Breast cancer Maternal Aunt        great   Heart disease Maternal Aunt    Crohn's disease Maternal Aunt    Diabetes Maternal Grandmother    Ovarian cancer Neg Hx     Social History   Socioeconomic History   Marital status: Single    Spouse name: Not on file   Number of children: Not on file   Years of education: Not on file   Highest education level: Not on file  Occupational History   Not on file  Tobacco Use   Smoking status: Every Day    Packs/day: 0.50    Years: 25.00    Total pack years: 12.50    Types: Cigarettes    Start date: 07/03/1992   Smokeless tobacco: Never   Tobacco comments:    Down to 5 cigarettes daily   Vaping Use   Vaping Use: Never used  Substance and Sexual Activity   Alcohol use: Yes    Comment: occasional   Drug use: Not Currently    Comment: once a week   Sexual activity: Yes    Birth control/protection: None    Comment: Ablation  Other Topics Concern   Not on file  Social History Narrative   She is a Emergency planning/management officer, 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, 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    Mother moved in with her   Social Determinants of Health   Financial Resource Strain: Medium Risk (05/09/2022)   Overall Financial Resource Strain (CARDIA)    Difficulty of Paying Living Expenses: Somewhat hard  Food Insecurity: No Food Insecurity (05/09/2022)   Hunger Vital Sign    Worried About Running Out of Food in the Last Year: Never true    [place] in the Last Year: Never true  Transportation Needs: No Transportation Needs (05/09/2022)   PRAPARE - Hydrologist (Medical): No    Lack of Transportation (Non-Medical): No  Physical Activity: Sufficiently Active (05/09/2022)   Exercise Vital Sign    Days of Exercise per Week: 5 days    Minutes of Exercise per Session: 40 min  Recent Concern: Physical Activity - Insufficiently Active (04/10/2022)   Exercise  Vital Sign    Days of Exercise per Week: 3 days    Minutes of Exercise per Session: 20 min  Stress: No Stress Concern Present (05/09/2022)   [place]    Feeling of Stress : Only a little  Social Connections: Moderately Isolated (05/09/2022)   Social Connection and Isolation Panel [NHANES]    Frequency of Communication with Friends and Family: More than three times a week    Frequency of Social Gatherings with Friends and Family: Twice a week    Attends Religious Services: More than 4 times per year    Active Member of [employer] or Organizations: No    Attends Archivist Meetings: Never    Marital Status: Never married  Intimate Partner Violence: Not At Risk (05/09/2022)   Humiliation, Afraid, Rape, and Kick questionnaire    Fear of Current or Ex-Partner: No    Emotionally Abused: No    Physically Abused: No    Sexually Abused: No     Current Outpatient Medications:    aspirin EC 81 MG tablet, Take 81 mg by mouth daily. Swallow whole., Disp: , Rfl:    buPROPion (WELLBUTRIN SR) 150 MG 12 hr tablet, Take 1 tablet (150 mg total) by mouth 2 (two) times daily., Disp: 60 tablet, Rfl: 0   nebivolol (BYSTOLIC) 5 MG tablet, Take 1 tablet (5 mg total) by mouth daily., Disp: 30 tablet, Rfl: 0  Allergies  Allergen Reactions   Hepatitis B Virus Vaccines     Dysarthria      ROS  Constitutional: Negative for fever or weight change.  Respiratory: positive  for nocturnal cough but no  shortness of breath.   Cardiovascular: Negative for  chest pain or palpitations.  Gastrointestinal: Negative for abdominal pain, no bowel changes.  Musculoskeletal: Negative for gait problem or joint swelling.  Skin: Negative for rash.  Neurological: Negative for dizziness or headache.  No other specific complaints in a complete review of systems (except as listed in HPI above).   Objective  Vitals:   05/09/22 1016 05/09/22 1059  BP:  (!) 152/90 (!) 142/86  Pulse: 86   Resp: 16   SpO2: 97%   Weight: 225 lb (102.1 kg)   Height: '5\' 3"'  (1.6 m)     Body mass index is 39.86 kg/m.  Physical Exam  Constitutional: Patient appears well-developed and well-nourished. No distress.  HENT: Head: Normocephalic and atraumatic. Ears: B TMs ok, no erythema or effusion; Nose: Nose normal. Mouth/Throat: Oropharynx is clear and moist. No oropharyngeal exudate.  Eyes: Conjunctivae and EOM are normal. Pupils are equal, round, and reactive to light. No scleral icterus.  Neck: Normal range of motion. Neck supple. No JVD present. No thyromegaly present.  Cardiovascular: Normal rate, regular rhythm and normal heart sounds.  No murmur heard. No BLE edema. Pulmonary/Chest: Effort normal and breath sounds normal. No respiratory distress. Abdominal: Soft. Bowel sounds are normal, no distension. There is no tenderness. no masses Breast: no lumps or masses, no nipple discharge or rashes FEMALE GENITALIA:  Not done  RECTAL: not done  Musculoskeletal: Normal range of motion, no joint effusions. No gross deformities Neurological: he is alert and oriented to person, place, and time. No cranial nerve deficit. Coordination, balance, strength, speech and gait are normal.  Skin: Skin is warm and dry. No rash noted. No erythema.  Psychiatric: Patient has a normal mood and affect. behavior is normal. Judgment and thought content normal.   Recent Results (from the past 2160 hour(s))  COMPLETE METABOLIC PANEL WITH GFR     Status: None   Collection Time: 03/12/22  2:43 PM  Result Value Ref Range   Glucose, Bld 96 65 - 139 mg/dL    Comment: .        Non-fasting reference interval .    BUN 9 7 - 25 mg/dL   Creat 0.65 0.50 - 0.99 mg/dL   eGFR 109 > OR = 60 mL/min/1.28m    Comment: The eGFR is based on the CKD-EPI 2021 equation. To calculate  the new eGFR from a previous Creatinine or Cystatin C result, go to  https://www.kidney.org/professionals/ kdoqi/gfr%5Fcalculator    BUN/Creatinine Ratio NOT APPLICABLE 6 - 22 (calc)   Sodium 139 135 - 146 mmol/L   Potassium 3.8 3.5 - 5.3 mmol/L   Chloride 107 98 - 110 mmol/L   CO2 24 20 - 32 mmol/L   Calcium 9.4 8.6 - 10.2 mg/dL   Total Protein 6.5 6.1 - 8.1 g/dL   Albumin 3.9 3.6 - 5.1 g/dL   Globulin 2.6 1.9 - 3.7 g/dL (calc)   AG Ratio 1.5 1.0 - 2.5 (calc)   Total Bilirubin 0.4 0.2 - 1.2 mg/dL   Alkaline phosphatase (APISO) 62 31 - 125 U/L   AST 15 10 - 35 U/L   ALT 15 6 - 29 U/L  CBC with Differential/Platelet     Status: Abnormal   Collection Time: 03/12/22  2:43 PM  Result Value Ref Range   WBC 11.7 (H) 3.8 - 10.8 Thousand/uL   RBC 4.53 3.80 - 5.10 Million/uL   Hemoglobin 13.8 11.7 - 15.5 g/dL   HCT 40.5 35.0 - 45.0 %   MCV 89.4 80.0 - 100.0 fL  MCH 30.5 27.0 - 33.0 pg   MCHC 34.1 32.0 - 36.0 g/dL   RDW 13.4 11.0 - 15.0 %   Platelets 357 140 - 400 Thousand/uL   MPV 9.7 7.5 - 12.5 fL   Neutro Abs 5,043 1,500 - 7,800 cells/uL   Lymphs Abs 5,850 (H) 850 - 3,900 cells/uL   Absolute Monocytes 597 200 - 950 cells/uL   Eosinophils Absolute 164 15 - 500 cells/uL   Basophils Absolute 47 0 - 200 cells/uL   Neutrophils Relative % 43.1 %   Total Lymphocyte 50.0 %   Monocytes Relative 5.1 %   Eosinophils Relative 1.4 %   Basophils Relative 0.4 %  Hepatitis panel, acute     Status: None   Collection Time: 03/23/22 12:17 PM  Result Value Ref Range   Hepatitis B Surface Ag NON REACTIVE NON REACTIVE   HCV Ab NON REACTIVE NON REACTIVE    Comment: (NOTE) Nonreactive HCV antibody screen is consistent with no HCV infections,  unless recent infection is suspected or other evidence exists to indicate HCV infection.     Hep A IgM NON REACTIVE NON REACTIVE   Hep B C IgM NON REACTIVE NON REACTIVE    Comment: Performed at [facility], [place] [address], [place], [place] [address]  Flow cytometry panel-leukemia/lymphoma work-up     Status: None    Collection Time: 03/23/22 12:17 PM  Result Value Ref Range   PATH INTERP XXX-IMP Comment     Comment: (NOTE) No significant immunophenotypic abnormality detected Lymphocytosis, see comment.    ANNOTATION COMMENT IMP Comment     Comment: (NOTE) The lymphocytosis is due to an increase in CD4+ T cells, CD8+ T cells and B cells. Increases in multiple lymphocyte subsets suggest a reactive process. Clinical correlation is recommended.    CLINICAL INFO Comment     Comment: (NOTE) Accompanying CBC dated 03/23/2022 shows: WBC count 12.9, Neu 6.1, Lym 6.0, Mon 0.6.    Specimen Type Comment     Comment: Peripheral blood   ASSESSMENT OF LEUKOCYTES Comment     Comment: (NOTE) No monoclonal B cell population is detected. kappa:lambda ratio 1.3. There is no loss of, or aberrant expression of, the pan T cell antigens to suggest a neoplastic T cell process. CD4:CD8 ratio 1.1. CD57 positive cells are relatively increased and are composed of a mixture of few CD4 positive T cells, many CD8 positive T cells and some NK cells, most consistent with a reactive process. CD57 is a marker of large granular lymphocytes. Clinical correlation is recommended. No circulating blasts are detected. There is no immunophenotypic  evidence of abnormal myeloid maturation. Analysis of the leukocyte population shows: granulocytes 52%, monocytes 4%, lymphocytes 44%, blasts <0.1%, B cells 9%, T cells 32%, LGLs 11%, NK cells 3%.    % Viable Cells Comment     Comment: 97%   ANALYSIS AND GATING STRATEGY Comment     Comment: (NOTE) 8 color analysis with CD45/SSC gating ZEBNC Performed at [facility], [address] [place], [place] [address]    IMMUNOPHENOTYPING STUDY Comment     Comment: (NOTE) CD2       Normal         CD3       Normal CD4       Normal         CD5       Normal CD7       Normal  CD8       Normal CD10      Normal         CD11b     Normal CD13      Normal         CD14       Normal CD16      Normal         CD19      Normal CD20      Normal         CD33      Normal CD34      Normal         CD38      Normal CD45      Normal         CD56      Normal CD57      Normal         CD117     Normal HLA-DR    Normal         KAPPA     Normal LAMBDA    Normal         CD64      Normal    PATHOLOGIST NAME Comment     Comment: Smiley Houseman, M.D.   COMMENT: Comment     Comment: (NOTE) Each antibody in this assay was utilized to assess for potential abnormalities of studied cell populations or to characterize identified abnormalities. This test was developed and its performance characteristics determined by [facility].  It has not been cleared or approved by the U.S. Food and Drug Administration. The FDA has determined that such clearance or approval is not necessary. This test is used for clinical purposes.  It should not be regarded as investigational or for research. Performed At: [facility] [address] [place], [place] [id number] Katina Degree MDPhD ID:[id number] Performed At: [facility] [address] [place], [place] [id number] Katina Degree MDPhD [id number] Performed At: [facility] [place] [address] [place], [place] [id number] Katina Degree MD [id number]   CBC with Differential/Platelet     Status: Abnormal   Collection Time: 03/23/22 12:17 PM  Result Value Ref Range   WBC 12.9 (H) 4.0 - 10.5 K/uL   RBC 4.52 3.87 - 5.11 MIL/uL   Hemoglobin 14.1 12.0 - 15.0 g/dL   HCT 40.7 36.0 - 46.0 %   MCV 90.0 80.0 - 100.0 fL   MCH 31.2 26.0 - 34.0 pg   MCHC 34.6 30.0 - 36.0 g/dL   RDW 13.4 11.5 - 15.5 %   Platelets 317 150 - 400 K/uL   nRBC 0.0 0.0 - 0.2 %   Neutrophils Relative % 47 %   Neutro Abs 6.1 1.7 - 7.7 K/uL   Lymphocytes Relative 46 %   Lymphs Abs 6.0 (H) 0.7 - 4.0 K/uL   Monocytes Relative 5 %   Monocytes Absolute 0.6 0.1 - 1.0 K/uL   Eosinophils Relative 1 %   Eosinophils Absolute 0.2 0.0 - 0.5 K/uL   Basophils Relative 1 %    Basophils Absolute 0.1 0.0 - 0.1 K/uL   Immature Granulocytes 0 %   Abs Immature Granulocytes 0.03 0.00 - 0.07 K/uL    Comment: Performed at [facility], [address] [place], [place] [address]     Fall Risk:    05/09/2022   10:15 AM 04/10/2022    2:42 PM 03/14/2022    9:35 AM 02/05/2022    9:22 AM 09/30/2020  2:45 PM  Fall Risk   Falls in the past year? '1 1 1 1 ' 0  Number falls in past yr: '1 1 1 1 ' 0  Injury with Fall? 0 0 0 0 0  Risk for fall due to : Impaired balance/gait;Impaired mobility Impaired balance/gait;Impaired mobility No Fall Risks No Fall Risks   Follow up Falls prevention discussed Falls prevention discussed;Education provided Falls prevention discussed Falls prevention discussed      Functional Status Survey: Is the patient deaf or have difficulty hearing?: No Does the patient have difficulty seeing, even when wearing glasses/contacts?: No Does the patient have difficulty concentrating, remembering, or making decisions?: No Does the patient have difficulty walking or climbing stairs?: No Does the patient have difficulty dressing or bathing?: No Does the patient have difficulty doing errands alone such as visiting a doctor's office or shopping?: No   Assessment & Plan  1. Well adult exam   2. Aneurysm of ascending aorta without rupture (HCC)  - nebivolol (BYSTOLIC) 5 MG tablet; Take 1 tablet (5 mg total) by mouth daily.  Dispense: 30 tablet; Refill: 0  3. Hypertension, benign  - nebivolol (BYSTOLIC) 5 MG tablet; Take 1 tablet (5 mg total) by mouth daily.  Dispense: 30 tablet; Refill: 0  4. Encounter for tobacco use cessation counseling  - buPROPion (WELLBUTRIN SR) 150 MG 12 hr tablet; Take 1 tablet (150 mg total) by mouth 2 (two) times daily.  Dispense: 60 tablet; Refill: 0  5. Needs flu shot  - Flu Vaccine QUAD 6+ mos PF IM (Fluarix Quad PF)  6. Breast cancer screening by mammogram  - MM 3D SCREEN BREAST BILATERAL; Future  7. Screen for  colon cancer  - Fecal Globin By Immunochemistry   -USPSTF grade A and B recommendations reviewed with patient; age-appropriate recommendations, preventive care, screening tests, etc discussed and encouraged; healthy living encouraged; see AVS for patient education given to patient -Discussed importance of 150 minutes of physical activity weekly, eat two servings of fish weekly, eat one serving of tree nuts ( cashews, pistachios, pecans, almonds.Marland Kitchen) every other day, eat 6 servings of fruit/vegetables daily and drink plenty of water and avoid sweet beverages.   -Reviewed Health Maintenance: Yes.

## 2022-05-08 NOTE — Patient Instructions (Signed)
Preventive Care 40-49 Years Old, Female Preventive care refers to lifestyle choices and visits with your health care provider that can promote health and wellness. Preventive care visits are also called wellness exams. What can I expect for my preventive care visit? Counseling Your health care provider may ask you questions about your: Medical history, including: Past medical problems. Family medical history. Pregnancy history. Current health, including: Menstrual cycle. Method of birth control. Emotional well-being. Home life and relationship well-being. Sexual activity and sexual health. Lifestyle, including: Alcohol, nicotine or tobacco, and drug use. Access to firearms. Diet, exercise, and sleep habits. Work and work environment. Sunscreen use. Safety issues such as seatbelt and bike helmet use. Physical exam Your health care provider will check your: Height and weight. These may be used to calculate your BMI (body mass index). BMI is a measurement that tells if you are at a healthy weight. Waist circumference. This measures the distance around your waistline. This measurement also tells if you are at a healthy weight and may help predict your risk of certain diseases, such as type 2 diabetes and high blood pressure. Heart rate and blood pressure. Body temperature. Skin for abnormal spots. What immunizations do I need?  Vaccines are usually given at various ages, according to a schedule. Your health care provider will recommend vaccines for you based on your age, medical history, and lifestyle or other factors, such as travel or where you work. What tests do I need? Screening Your health care provider may recommend screening tests for certain conditions. This may include: Lipid and cholesterol levels. Diabetes screening. This is done by checking your blood sugar (glucose) after you have not eaten for a while (fasting). Pelvic exam and Pap test. Hepatitis B test. Hepatitis C  test. HIV (human immunodeficiency virus) test. STI (sexually transmitted infection) testing, if you are at risk. Lung cancer screening. Colorectal cancer screening. Mammogram. Talk with your health care provider about when you should start having regular mammograms. This may depend on whether you have a family history of breast cancer. BRCA-related cancer screening. This may be done if you have a family history of breast, ovarian, tubal, or peritoneal cancers. Bone density scan. This is done to screen for osteoporosis. Talk with your health care provider about your test results, treatment options, and if necessary, the need for more tests. Follow these instructions at home: Eating and drinking  Eat a diet that includes fresh fruits and vegetables, whole grains, lean protein, and low-fat dairy products. Take vitamin and mineral supplements as recommended by your health care provider. Do not drink alcohol if: Your health care provider tells you not to drink. You are pregnant, may be pregnant, or are planning to become pregnant. If you drink alcohol: Limit how much you have to 0-1 drink a day. Know how much alcohol is in your drink. In the [place] one drink equals one 12 oz bottle of beer (355 mL), one 5 oz glass of wine (148 mL), or one 1 oz glass of hard liquor (44 mL). Lifestyle Brush your teeth every morning and night with fluoride toothpaste. Floss one time each day. Exercise for at least 30 minutes 5 or more days each week. Do not use any products that contain nicotine or tobacco. These products include cigarettes, chewing tobacco, and vaping devices, such as e-cigarettes. If you need help quitting, ask your health care provider. Do not use drugs. If you are sexually active, practice safe sex. Use a condom or other form of protection to   prevent STIs. If you do not wish to become pregnant, use a form of birth control. If you plan to become pregnant, see your health care provider for a  prepregnancy visit. Take aspirin only as told by your health care provider. Make sure that you understand how much to take and what form to take. Work with your health care provider to find out whether it is safe and beneficial for you to take aspirin daily. Find healthy ways to manage stress, such as: Meditation, yoga, or listening to music. Journaling. Talking to a trusted person. Spending time with friends and family. Minimize exposure to UV radiation to reduce your risk of skin cancer. Safety Always wear your seat belt while driving or riding in a vehicle. Do not drive: If you have been drinking alcohol. Do not ride with someone who has been drinking. When you are tired or distracted. While texting. If you have been using any mind-altering substances or drugs. Wear a helmet and other protective equipment during sports activities. If you have firearms in your house, make sure you follow all gun safety procedures. Seek help if you have been physically or sexually abused. What's next? Visit your health care provider once a year for an annual wellness visit. Ask your health care provider how often you should have your eyes and teeth checked. Stay up to date on all vaccines. This information is not intended to replace advice given to you by your health care provider. Make sure you discuss any questions you have with your health care provider. Document Revised: 03/01/2021 Document Reviewed: 03/01/2021 [employer] Patient Education  [place].

## 2022-05-09 ENCOUNTER — Encounter: Payer: Self-pay | Admitting: Family Medicine

## 2022-05-09 ENCOUNTER — Ambulatory Visit (INDEPENDENT_AMBULATORY_CARE_PROVIDER_SITE_OTHER): Payer: Medicaid Other | Admitting: Family Medicine

## 2022-05-09 VITALS — BP 142/86 | HR 86 | Resp 16 | Ht 63.0 in | Wt 225.0 lb

## 2022-05-09 DIAGNOSIS — I10 Essential (primary) hypertension: Secondary | ICD-10-CM

## 2022-05-09 DIAGNOSIS — Z1211 Encounter for screening for malignant neoplasm of colon: Secondary | ICD-10-CM | POA: Diagnosis not present

## 2022-05-09 DIAGNOSIS — I7121 Aneurysm of the ascending aorta, without rupture: Secondary | ICD-10-CM

## 2022-05-09 DIAGNOSIS — Z1231 Encounter for screening mammogram for malignant neoplasm of breast: Secondary | ICD-10-CM | POA: Diagnosis not present

## 2022-05-09 DIAGNOSIS — Z0000 Encounter for general adult medical examination without abnormal findings: Secondary | ICD-10-CM | POA: Diagnosis not present

## 2022-05-09 DIAGNOSIS — Z23 Encounter for immunization: Secondary | ICD-10-CM

## 2022-05-09 DIAGNOSIS — Z716 Tobacco abuse counseling: Secondary | ICD-10-CM | POA: Diagnosis not present

## 2022-05-09 MED ORDER — NEBIVOLOL HCL 5 MG PO TABS: 5.0000 mg | ORAL_TABLET | Freq: Every day | ORAL | 0 refills | Status: DC

## 2022-05-09 MED ORDER — BUPROPION HCL ER (SR) 150 MG PO TB12: 150.0000 mg | ORAL_TABLET | Freq: Two times a day (BID) | ORAL | 0 refills | Status: DC

## 2022-05-27 ENCOUNTER — Encounter: Payer: Self-pay | Admitting: Family Medicine

## 2022-05-31 ENCOUNTER — Other Ambulatory Visit: Payer: Self-pay | Admitting: Family Medicine

## 2022-05-31 DIAGNOSIS — N63 Unspecified lump in unspecified breast: Secondary | ICD-10-CM

## 2022-06-07 ENCOUNTER — Encounter: Payer: Self-pay | Admitting: Internal Medicine

## 2022-06-07 ENCOUNTER — Ambulatory Visit: Payer: Medicaid Other | Attending: Internal Medicine | Admitting: Internal Medicine

## 2022-06-07 VITALS — BP 158/100 | HR 65 | Ht 63.0 in | Wt 225.0 lb

## 2022-06-07 DIAGNOSIS — R002 Palpitations: Secondary | ICD-10-CM | POA: Diagnosis not present

## 2022-06-07 DIAGNOSIS — I7121 Aneurysm of the ascending aorta, without rupture: Secondary | ICD-10-CM | POA: Diagnosis not present

## 2022-06-07 DIAGNOSIS — Z79899 Other long term (current) drug therapy: Secondary | ICD-10-CM

## 2022-06-07 DIAGNOSIS — I10 Essential (primary) hypertension: Secondary | ICD-10-CM | POA: Diagnosis not present

## 2022-06-07 MED ORDER — LOSARTAN POTASSIUM-HCTZ 100-12.5 MG PO TABS: 1.0000 | ORAL_TABLET | Freq: Every day | ORAL | 3 refills | Status: DC

## 2022-06-07 NOTE — Progress Notes (Signed)
New Outpatient Visit Date: 06/07/2022  Referring Provider: Steele Sizer, MD [address] [place],  [place] [address]  Chief Complaint: Palpitations  HPI:  Jill Davidson is a 51 y.o. female who is being seen today for the evaluation of palpitations at the request of Jill Davidson. She has a history of hypertension, iron deficiency anemia, uterine fibroids, and recently diagnosed thoracic aortic aneurysm.  She saw Dr. Ancil Boozer in July, at which time [name] complained of palpitations.  Preceding CT of the chest also demonstrated mildly dilated ascending aorta, measuring 4.1 cm in maximal diameter.  Right lung opacities were also noted as well as multiple tiny pulmonary nodules.  Pulmonary and vascular surgery referrals were made at that time as well.  Today, [name] reports that she has been feeling quite well.  She recounts one episode of palpitation that woke her up in the middle of the night a month or two ago.  She felt her heart racing for ~5 minutes.  There were no associated symptoms, including chest pain, shortness of breath, or lightheadedness.  She has otherwise been asymptomatic other than some aching in the left hip that began after she stepped awkwardly while getting out of a vehicle.  She denies a history of heart disease and prior cardiac evaluation.  She drinks at least 2 caffeinated beverages per day.  --------------------------------------------------------------------------------------------------  Cardiovascular History & Procedures: Cardiovascular Problems: Palpitations Thoracic aortic aneurysm  Risk Factors: Hypertension, vascular disease, obesity, and tobacco use  Cath/PCI: None  CV Surgery: None  EP Procedures and Devices: None  Non-Invasive Evaluation(s): None  Recent CV Pertinent Labs: Lab Results  Component Value Date   CHOL 134 01/30/2022   CHOL 138 08/15/2015   HDL <10 (L) 01/30/2022   HDL 45 08/15/2015   LDLCALC NOT CALCULATED 01/30/2022    LDLCALC 101 (H) 09/05/2020   TRIG 226 (H) 01/30/2022   CHOLHDL NOT CALCULATED 01/30/2022   INR 1.2 01/28/2022   K 3.8 03/12/2022   K 3.6 08/02/2017   MG 2.2 01/28/2022   BUN 9 03/12/2022   BUN 8.8 08/02/2017   CREATININE 0.65 03/12/2022   CREATININE 0.7 08/02/2017    --------------------------------------------------------------------------------------------------  Past Medical History:  Diagnosis Date   AR (allergic rhinitis)    Bacterial vaginosis    Complication of anesthesia 2008   PT STATES THAT DURING C-SECTION SHE WAS ADMINISTERED A GAS THAT CAUSED HER HEART TO BEAT IRREGULAR- PT HAS HAD NO PROBLEMS WITH THIS SINCE 2008   Dysrhythmia    DUE TO A GAS THAT WAS ADMINISTERED DURING A 2008 C-SECTION-NO PROBLEMS SINCE   Fibroid    GERD (gastroesophageal reflux disease)    OCC   Hypertension    IDA (iron deficiency anemia) 01/25/2015   UTI (lower urinary tract infection)     Past Surgical History:  Procedure Laterality Date   CESAREAN SECTION     X2   DILATION AND CURETTAGE OF UTERUS     DILITATION & CURRETTAGE/HYSTROSCOPY WITH NOVASURE ABLATION N/A 07/23/2016   Procedure: DILATATION & CURETTAGE/HYSTEROSCOPY WITH NOVASURE ABLATION;  Surgeon: Brayton Mars, MD;  Location: [facility];  Service: Gynecology;  Laterality: N/A;   LAPAROSCOPIC VAGINAL HYSTERECTOMY WITH SALPINGECTOMY Bilateral 03/24/2018   Procedure: LAPAROSCOPIC ASSISTED VAGINAL HYSTERECTOMY WITH BILATERAL SALPINGECTOMY;  Surgeon: Brayton Mars, MD;  Location: [facility];  Service: Gynecology;  Laterality: Bilateral;    Current Meds  Medication Sig   aspirin EC 81 MG tablet Take 81 mg by mouth daily. Swallow whole.  Allergies: Hepatitis b virus vaccines  Social History   Tobacco Use   Smoking status: Every Day    Packs/day: 0.50    Years: 25.00    Total pack years: 12.50    Types: Cigarettes    Start date: 07/03/1992   Smokeless tobacco: Never   Tobacco comments:    Down to 5  cigarettes daily   Vaping Use   Vaping Use: Never used  Substance Use Topics   Alcohol use: Yes    Comment: occasional   Drug use: Not Currently    Comment: once a week    Family History  Problem Relation Age of Onset   Hypertension Mother    Thyroid disease Mother    Lung cancer Mother    Asthma Mother    Diabetes Father    Hypertension Father    Hypertension Brother    Breast cancer Maternal Aunt        great   Heart disease Maternal Aunt    Crohn's disease Maternal Aunt    Diabetes Maternal Grandmother    Ovarian cancer Neg Hx     Review of Systems: A 12-system review of systems was performed and was negative except as noted in the HPI.  --------------------------------------------------------------------------------------------------  Physical Exam: BP (!) 158/100 (BP Location: Left Arm, Patient Position: Sitting, Cuff Size: Large)   Pulse 65   Ht '5\' 3"'$  (1.6 m)   Wt 225 lb (102.1 kg)   LMP 03/21/2018 (Exact Date)   SpO2 99%   BMI 39.86 kg/m  Repeat BP: 158/100  General:  NAD. HEENT: No conjunctival pallor or scleral icterus. Facemask in place. Neck: Supple without lymphadenopathy, thyromegaly, JVD, or HJR. No carotid bruit. Lungs: Normal work of breathing. Clear to auscultation bilaterally without wheezes or crackles. Heart: Regular rate and rhythm without murmurs, rubs, or gallops. Non-displaced PMI. Abd: Bowel sounds present. Soft, NT/ND without hepatosplenomegaly Ext: No lower extremity edema. Radial, PT, and DP pulses are 2+ bilaterally Skin: Warm and dry without rash. Neuro: CNIII-XII intact. Strength and fine-touch sensation intact in upper and lower extremities bilaterally. Psych: Normal mood and affect.  EKG:  Normal sinus rhythm with nonspecific T wave changes.  Heart rate has decreased since 01/28/2022.  Nonspecific T wave changes are now present.  Lab Results  Component Value Date   WBC 12.9 (H) 03/23/2022   HGB 14.1 03/23/2022   HCT 40.7  03/23/2022   MCV 90.0 03/23/2022   PLT 317 03/23/2022    Lab Results  Component Value Date   NA 139 03/12/2022   K 3.8 03/12/2022   CL 107 03/12/2022   CO2 24 03/12/2022   BUN 9 03/12/2022   CREATININE 0.65 03/12/2022   GLUCOSE 96 03/12/2022   ALT 15 03/12/2022    Lab Results  Component Value Date   CHOL 134 01/30/2022   HDL <10 (L) 01/30/2022   LDLCALC NOT CALCULATED 01/30/2022   TRIG 226 (H) 01/30/2022   CHOLHDL NOT CALCULATED 01/30/2022   Lab Results  Component Value Date   TSH 0.888 01/28/2022   --------------------------------------------------------------------------------------------------  ASSESSMENT AND PLAN: Palpitations: Ms. Mentel reports a single episode of self-limited rapid heart beat that lasted ~5 minutes a couple of months ago.  Physical exam today is normal.  EKG shows sinus rhythm.  Prior labs, including TSH and electrolytes, were normal.  We discussed utility of ambulatory cardiac monitoring, but given isolated nature of this event, we have agreed to defer this.  Thoracic aortic aneurysm: Mildly dilated thoracic aorta  noted on chest CT.  This is being following by vascular surgery.  I recommend obtaining an echocardiogram to exclude aortic regurgitation and other aortic valve disease that could underlie TAA.  Continue BP control and statin therapy.  Hypertension: BP not well controlled today.  Patient was unable to have nebivolol filled due to cost.  She was previously on losartan-HCTZ and was tolerating this well; it was stopped in May during admission for pneumonia and AKI.  She wishes to retry this.  We will restart losartan-HCTZ 100-12.5 mg daily and recheck a BMP in 1-2 weeks.  Sodium restriction encouraged.  Follow-up: Return to clinic in 3 months.  Nelva Bush, MD 06/07/2022 9:24 AM

## 2022-06-07 NOTE — Patient Instructions (Signed)
Medication Instructions:  START: Losartan-Hydrochlorothiazide 100-12.5 mg daily  *If you need a refill on your cardiac medications before your next appointment, please call your pharmacy*   Lab Work: Your physician recommends that you return for lab work in 2-3 weeks (BMP) at [facility]   If you have labs (blood work) drawn today and your tests are completely normal, you will receive your results only by: [place] (if you have MyChart) OR A paper copy in the mail If you have any lab test that is abnormal or we need to change your treatment, we will call you to review the results.   Testing/Procedures: Your physician has requested that you have an echocardiogram. Echocardiography is a painless test that uses sound waves to create images of your heart. It provides your doctor with information about the size and shape of your heart and how well your heart's chambers and valves are working. This procedure takes approximately one hour. There are no restrictions for this procedure. [place] [address]    Follow-Up: At [facility], you and your health needs are our priority.  As part of our continuing mission to provide you with exceptional heart care, we have created designated Provider Care Teams.  These Care Teams include your primary Cardiologist (physician) and Advanced Practice Providers (APPs -  Physician Assistants and Nurse Practitioners) who all work together to provide you with the care you need, when you need it.  We recommend signing up for the patient portal called "MyChart".  Sign up information is provided on this After Visit Summary.  MyChart is used to connect with patients for Virtual Visits (Telemedicine).  Patients are able to view lab/test results, encounter notes, upcoming appointments, etc.  Non-urgent messages can be sent to your provider as well.   To learn more about what you can do with MyChart, go to [email or web address].     Your next appointment:   3 month(s)  The format for your next appointment:   In Person  Provider:   Nelva Bush, MD

## 2022-06-08 NOTE — Progress Notes (Deleted)
Name: Jill Davidson   MRN: [id number]    DOB: 09/30/1970   Date:06/08/2022       Progress Note  Subjective  Chief Complaint  Follow Up  HPI  bnormal CT lung: with possible inflammatory versus infectious process and aortic aneurysm, discussed results with patient we will refer her to vascular surgeon and pulmonologist. She CAP in May but no longer having cough, fatigue or SOB. She had palpitations about one week ago that kept her up for about one hour with mild associated chest pain   Headaches: she has noticed headaches dull ache, mild a few times a week. Taking Tylenol and resolves, no photophobia, phonophobia , nausea or vomiting  Leucocytosis: seen by Dr. 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) 06/05/2018   Vasomotor symptoms due to menopause 05/06/2018   S/P laparoscopic assisted vaginal hysterectomy (LAVH) bilateral salpingectomy right oophorectomy 03/24/2018   Essential hypertension 07/25/2016   Status post endometrial ablation 07/23/2016   IDA (iron deficiency anemia) 01/25/2015    Past Surgical History:  Procedure Laterality Date   CESAREAN SECTION     X2   DILATION AND CURETTAGE OF UTERUS     DILITATION & CURRETTAGE/HYSTROSCOPY WITH NOVASURE ABLATION N/A 07/23/2016   Procedure: DILATATION & CURETTAGE/HYSTEROSCOPY WITH NOVASURE ABLATION;  Surgeon: Brayton Mars, MD;  Location: [facility];  Service: Gynecology;  Laterality: N/A;   LAPAROSCOPIC VAGINAL HYSTERECTOMY WITH SALPINGECTOMY Bilateral 03/24/2018    Procedure: LAPAROSCOPIC ASSISTED VAGINAL HYSTERECTOMY WITH BILATERAL SALPINGECTOMY;  Surgeon: Brayton Mars, MD;  Location: [facility];  Service: Gynecology;  Laterality: Bilateral;    Family History  Problem Relation Age of Onset   Heart failure Mother    Hypertension Mother    Thyroid disease Mother    Lung cancer Mother    Asthma Mother    Peripheral Artery Disease Mother        stents in legs   Diabetes Father    Hypertension Father    Hypertension Brother    Heart failure Maternal Aunt    Breast cancer Maternal Aunt        great   Crohn's disease Maternal Aunt    Diabetes Maternal Grandmother    Cerebral aneurysm Maternal Grandmother    Ovarian cancer Neg Hx     Social History   Tobacco Use   Smoking status: Every Day    Packs/day: 0.50    Years: 25.00    Total pack years: 12.50    Types: Cigarettes    Start date: 07/03/1992   Smokeless tobacco: Never   Tobacco comments:    Down to 5 cigarettes daily   Substance Use Topics   Alcohol use: Yes    Comment: occasional 1 drink every few weeks     Current Outpatient Medications:    aspirin EC 81 MG tablet, Take 81 mg by mouth daily. Swallow whole., Disp: , Rfl:    losartan-hydrochlorothiazide (HYZAAR) 100-12.5 MG tablet, Take 1 tablet by mouth daily., Disp: 90 tablet, Rfl: 3  Allergies  Allergen  Reactions   Hepatitis B Virus Vaccines     Dysarthria     I personally reviewed active problem list, medication list, allergies, family history, social history, health maintenance with the patient/caregiver today.   ROS  ***  Objective  There were no vitals filed for this visit.  There is no height or weight on file to calculate BMI.  Physical Exam ***  Recent Results (from the past 2160 hour(s))  COMPLETE METABOLIC PANEL WITH GFR     Status: None   Collection Time: 03/12/22  2:43 PM  Result Value Ref Range   Glucose, Bld 96 65 - 139 mg/dL    Comment: .        Non-fasting reference  interval .    BUN 9 7 - 25 mg/dL   Creat 0.65 0.50 - 0.99 mg/dL   eGFR 109 > OR = 60 mL/min/1.6m    Comment: The eGFR is based on the CKD-EPI 2021 equation. To calculate  the new eGFR from a previous Creatinine or Cystatin C result, go to https://www.kidney.org/professionals/ kdoqi/gfr%5Fcalculator    BUN/Creatinine Ratio NOT APPLICABLE 6 - 22 (calc)   Sodium 139 135 - 146 mmol/L   Potassium 3.8 3.5 - 5.3 mmol/L   Chloride 107 98 - 110 mmol/L   CO2 24 20 - 32 mmol/L   Calcium 9.4 8.6 - 10.2 mg/dL   Total Protein 6.5 6.1 - 8.1 g/dL   Albumin 3.9 3.6 - 5.1 g/dL   Globulin 2.6 1.9 - 3.7 g/dL (calc)   AG Ratio 1.5 1.0 - 2.5 (calc)   Total Bilirubin 0.4 0.2 - 1.2 mg/dL   Alkaline phosphatase (APISO) 62 31 - 125 U/L   AST 15 10 - 35 U/L   ALT 15 6 - 29 U/L  CBC with Differential/Platelet     Status: Abnormal   Collection Time: 03/12/22  2:43 PM  Result Value Ref Range   WBC 11.7 (H) 3.8 - 10.8 Thousand/uL   RBC 4.53 3.80 - 5.10 Million/uL   Hemoglobin 13.8 11.7 - 15.5 g/dL   HCT 40.5 35.0 - 45.0 %   MCV 89.4 80.0 - 100.0 fL   MCH 30.5 27.0 - 33.0 pg   MCHC 34.1 32.0 - 36.0 g/dL   RDW 13.4 11.0 - 15.0 %   Platelets 357 140 - 400 Thousand/uL   MPV 9.7 7.5 - 12.5 fL   Neutro Abs 5,043 1,500 - 7,800 cells/uL   Lymphs Abs 5,850 (H) 850 - 3,900 cells/uL   Absolute Monocytes 597 200 - 950 cells/uL   Eosinophils Absolute 164 15 - 500 cells/uL   Basophils Absolute 47 0 - 200 cells/uL   Neutrophils Relative % 43.1 %   Total Lymphocyte 50.0 %   Monocytes Relative 5.1 %   Eosinophils Relative 1.4 %   Basophils Relative 0.4 %  Hepatitis panel, acute     Status: None   Collection Time: 03/23/22 12:17 PM  Result Value Ref Range   Hepatitis B Surface Ag NON REACTIVE NON REACTIVE   HCV Ab NON REACTIVE NON REACTIVE    Comment: (NOTE) Nonreactive HCV antibody screen is consistent with no HCV infections,  unless recent infection is suspected or other evidence exists to indicate HCV  infection.     Hep A IgM NON REACTIVE NON REACTIVE   Hep B C IgM NON REACTIVE NON REACTIVE    Comment: Performed at [facility] [address] [id number] Flow cytometry panel-leukemia/lymphoma work-up  Status: None   Collection Time: 03/23/22 12:17 PM  Result Value Ref Range   PATH INTERP XXX-IMP Comment     Comment: (NOTE) No significant immunophenotypic abnormality detected Lymphocytosis, see comment.    ANNOTATION COMMENT IMP Comment     Comment: (NOTE) The lymphocytosis is due to an increase in CD4+ T cells, CD8+ T cells and B cells. Increases in multiple lymphocyte subsets suggest a reactive process. Clinical correlation is recommended.    CLINICAL INFO Comment     Comment: (NOTE) Accompanying CBC dated 03/23/2022 shows: WBC count 12.9, Neu 6.1, Lym 6.0, Mon 0.6.    Specimen Type Comment     Comment: Peripheral blood   ASSESSMENT OF LEUKOCYTES Comment     Comment: (NOTE) No monoclonal B cell population is detected. kappa:lambda ratio 1.3. There is no loss of, or aberrant expression of, the pan T cell antigens to suggest a neoplastic T cell process. CD4:CD8 ratio 1.1. CD57 positive cells are relatively increased and are composed of a mixture of few CD4 positive T cells, many CD8 positive T cells and some NK cells, most consistent with a reactive process. CD57 is a marker of large granular lymphocytes. Clinical correlation is recommended. No circulating blasts are detected. There is no immunophenotypic  evidence of abnormal myeloid maturation. Analysis of the leukocyte population shows: granulocytes 52%, monocytes 4%, lymphocytes 44%, blasts <0.1%, B cells 9%, T cells 32%, LGLs 11%, NK cells 3%.    % Viable Cells Comment     Comment: 97%   ANALYSIS AND GATING STRATEGY Comment     Comment: (NOTE) 8 color analysis with CD45/SSC gating ZEBNC Performed at [facility], [address] [place], [place] [address]     IMMUNOPHENOTYPING STUDY Comment     Comment: (NOTE) CD2       Normal         CD3       Normal CD4       Normal         CD5       Normal CD7       Normal         CD8       Normal CD10      Normal         CD11b     Normal CD13      Normal         CD14      Normal CD16      Normal         CD19      Normal CD20      Normal         CD33      Normal CD34      Normal         CD38      Normal CD45      Normal         CD56      Normal CD57      Normal         CD117     Normal HLA-DR    Normal         KAPPA     Normal LAMBDA    Normal         CD64      Normal    PATHOLOGIST NAME Comment     Comment: Smiley Houseman, M.D.   COMMENT: Comment     Comment: (NOTE) Each antibody in this assay was utilized to  assess for potential abnormalities of studied cell populations or to characterize identified abnormalities. This test was developed and its performance characteristics determined by [facility].  It has not been cleared or approved by the U.S. Food and Drug Administration. The FDA has determined that such clearance or approval is not necessary. This test is used for clinical purposes.  It should not be regarded as investigational or for research. Performed At: [facility] [address] [place], [place] [id number] Katina Degree MDPhD [id number] Performed At: [facility] [address] [place], [place] [id number] Katina Degree MDPhD [id number] Performed At: [facility] [place], [place] [id number] Katina Degree MD [id number]   CBC with Differential/Platelet     Status: Abnormal   Collection Time: 03/23/22 12:17 PM  Result Value Ref Range   WBC 12.9 (H) 4.0 - 10.5 K/uL   RBC 4.52 3.87 - 5.11 MIL/uL   Hemoglobin 14.1 12.0 - 15.0 g/dL   HCT 40.7 36.0 - 46.0 %   MCV 90.0 80.0 - 100.0 fL   MCH 31.2 26.0 - 34.0 pg   MCHC 34.6 30.0 - 36.0 g/dL   RDW 13.4 11.5 - 15.5 %   Platelets 317 150 - 400 K/uL   nRBC 0.0 0.0 - 0.2 %   Neutrophils Relative % 47 %   Neutro  Abs 6.1 1.7 - 7.7 K/uL   Lymphocytes Relative 46 %   Lymphs Abs 6.0 (H) 0.7 - 4.0 K/uL   Monocytes Relative 5 %   Monocytes Absolute 0.6 0.1 - 1.0 K/uL   Eosinophils Relative 1 %   Eosinophils Absolute 0.2 0.0 - 0.5 K/uL   Basophils Relative 1 %   Basophils Absolute 0.1 0.0 - 0.1 K/uL   Immature Granulocytes 0 %   Abs Immature Granulocytes 0.03 0.00 - 0.07 K/uL    Comment: Performed at [facility], [address] [place], [place] [address]    PHQ2/9:    05/09/2022   10:15 AM 04/10/2022    2:42 PM 03/14/2022    9:39 AM 02/05/2022    9:22 AM 09/30/2020    2:46 PM  Depression screen PHQ 2/9  Decreased Interest 0 0 0 3 0  Down, Depressed, Hopeless 0 0 0 3 0  PHQ - 2 Score 0 0 0 6 0  Altered sleeping 3 0 0 3 2  Tired, decreased energy 3 0 '3 3 2  ' Change in appetite 3 0 3 1 0  Feeling bad or failure about yourself  0 0 0 0 0  Trouble concentrating 0 0 0 1 0  Moving slowly or fidgety/restless 0 0 0 0 0  Suicidal thoughts 0 0 0 0 0  PHQ-9 Score 9 0 '6 14 4  ' Difficult doing work/chores  Not difficult at all   Not difficult at all    phq 9 is {gen pos [id number]}   Fall Risk:    05/09/2022   10:15 AM 04/10/2022    2:42 PM 03/14/2022    9:35 AM 02/05/2022    9:22 AM 09/30/2020    2:45 PM  Fall Risk   Falls in the past year? '1 1 1 1 ' 0  Number falls in past yr: '1 1 1 1 ' 0  Injury with Fall? 0 0 0 0 0  Risk for fall due to : Impaired balance/gait;Impaired mobility Impaired balance/gait;Impaired mobility No Fall Risks No Fall Risks   Follow up Falls prevention discussed Falls prevention discussed;Education provided Falls prevention  discussed Falls prevention discussed       Functional Status Survey:      Assessment & Plan  *** There are no diagnoses linked to this encounter.

## 2022-06-11 ENCOUNTER — Ambulatory Visit: Payer: Medicaid Other | Admitting: Family Medicine

## 2022-06-12 ENCOUNTER — Other Ambulatory Visit (INDEPENDENT_AMBULATORY_CARE_PROVIDER_SITE_OTHER): Payer: Self-pay | Admitting: Nurse Practitioner

## 2022-06-12 DIAGNOSIS — I7121 Aneurysm of the ascending aorta, without rupture: Secondary | ICD-10-CM

## 2022-06-13 ENCOUNTER — Encounter (INDEPENDENT_AMBULATORY_CARE_PROVIDER_SITE_OTHER): Payer: Self-pay | Admitting: Nurse Practitioner

## 2022-06-13 ENCOUNTER — Ambulatory Visit (INDEPENDENT_AMBULATORY_CARE_PROVIDER_SITE_OTHER): Payer: Medicaid Other

## 2022-06-13 ENCOUNTER — Ambulatory Visit (INDEPENDENT_AMBULATORY_CARE_PROVIDER_SITE_OTHER): Payer: Medicaid Other | Admitting: Nurse Practitioner

## 2022-06-13 VITALS — BP 157/93 | HR 66 | Resp 16 | Ht 63.0 in | Wt 225.0 lb

## 2022-06-13 DIAGNOSIS — I7121 Aneurysm of the ascending aorta, without rupture: Secondary | ICD-10-CM

## 2022-06-13 DIAGNOSIS — I10 Essential (primary) hypertension: Secondary | ICD-10-CM | POA: Diagnosis not present

## 2022-06-13 DIAGNOSIS — Z720 Tobacco use: Secondary | ICD-10-CM

## 2022-06-13 NOTE — Progress Notes (Signed)
Subjective:    Patient ID: Jill Davidson, female    DOB: 03-22-1971, 51 y.o.   MRN: [id number] Chief Complaint  Patient presents with   Follow-up    ultrasound    [name] is a 51 year old female who presents today for evaluation of an ascending aortic aneurysm.  This was found incidentally following a CT scan for paratracheal lymphadenopathy.  The CT report notes that the abdominal aortic aneurysm is approximately 4.1 cm however manual review shows that it is closer to 4.8 cm.  She currently denies any chest pain.  She denies any signs and symptoms of distal embolization.  She denies any claudication-like symptoms or rest pain.  Today she returns for evaluation for possible domino aortic aneurysm.  Today noninvasive study showed no evidence of an abdominal aortic aneurysm.    Review of Systems  All other systems reviewed and are negative.      Objective:   Physical Exam Vitals reviewed.  HENT:     Head: Normocephalic.  Cardiovascular:     Rate and Rhythm: Normal rate.     Pulses: Normal pulses.  Pulmonary:     Effort: Pulmonary effort is normal.  Skin:    General: Skin is warm and dry.  Neurological:     Mental Status: She is alert and oriented to person, place, and time.  Psychiatric:        Mood and Affect: Mood normal.        Behavior: Behavior normal.        Thought Content: Thought content normal.        Judgment: Judgment normal.     BP (!) 157/93 (BP Location: Left Arm)   Pulse 66   Resp 16   Ht '5\' 3"'$  (1.6 m)   Wt 225 lb (102.1 kg)   LMP 03/21/2018 (Exact Date)   BMI 39.86 kg/m   Past Medical History:  Diagnosis Date   AR (allergic rhinitis)    Bacterial vaginosis    Complication of anesthesia 2008   PT STATES THAT DURING C-SECTION SHE WAS ADMINISTERED A GAS THAT CAUSED HER HEART TO BEAT IRREGULAR- PT HAS HAD NO PROBLEMS WITH THIS SINCE 2008   Dysrhythmia    DUE TO A GAS THAT WAS ADMINISTERED DURING A 2008 C-SECTION-NO PROBLEMS SINCE   Fibroid     GERD (gastroesophageal reflux disease)    OCC   Hypertension    IDA (iron deficiency anemia) 01/25/2015   UTI (lower urinary tract infection)     Social History   Socioeconomic History   Marital status: Single    Spouse name: Not on file   Number of children: Not on file   Years of education: Not on file   Highest education level: Not on file  Occupational History   Not on file  Tobacco Use   Smoking status: Every Day    Packs/day: 0.50    Years: 25.00    Total pack years: 12.50    Types: Cigarettes    Start date: 07/03/1992   Smokeless tobacco: Never   Tobacco comments:    Down to 5 cigarettes daily   Vaping Use   Vaping Use: Never used  Substance and Sexual Activity   Alcohol use: Yes    Comment: occasional 1 drink every few weeks   Drug use: Not Currently    Types: Marijuana    Comment: None in over a year   Sexual activity: Yes    Birth control/protection: None  Comment: Ablation  Other Topics Concern   Not on file  Social History Narrative   She is a Emergency planning/management officer, 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, 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    Mother moved in with her   Social Determinants of Health   Financial Resource Strain: Medium Risk (05/09/2022)   Overall Financial Resource Strain (CARDIA)    Difficulty of Paying Living Expenses: Somewhat hard  Food Insecurity: No Food Insecurity (05/09/2022)   Hunger Vital Sign    Worried About Running Out of Food in the Last Year: Never true    [place] in the Last Year: Never true  Transportation Needs: No Transportation Needs (05/09/2022)   PRAPARE - Hydrologist (Medical): No    Lack of Transportation (Non-Medical): No  Physical Activity: Sufficiently Active (05/09/2022)   Exercise Vital Sign    Days of Exercise per Week: 5 days    Minutes of Exercise per Session: 40 min  Recent Concern: Physical Activity - Insufficiently Active (04/10/2022)   Exercise Vital Sign     Days of Exercise per Week: 3 days    Minutes of Exercise per Session: 20 min  Stress: No Stress Concern Present (05/09/2022)   [place]    Feeling of Stress : Only a little  Social Connections: Moderately Isolated (05/09/2022)   Social Connection and Isolation Panel [NHANES]    Frequency of Communication with Friends and Family: More than three times a week    Frequency of Social Gatherings with Friends and Family: Twice a week    Attends Religious Services: More than 4 times per year    Active Member of [employer] or Organizations: No    Attends Archivist Meetings: Never    Marital Status: Never married  Intimate Partner Violence: Not At Risk (05/09/2022)   Humiliation, Afraid, Rape, and Kick questionnaire    Fear of Current or Ex-Partner: No    Emotionally Abused: No    Physically Abused: No    Sexually Abused: No    Past Surgical History:  Procedure Laterality Date   CESAREAN SECTION     X2   DILATION AND CURETTAGE OF UTERUS     DILITATION & CURRETTAGE/HYSTROSCOPY WITH Calvin N/A 07/23/2016   Procedure: DILATATION & CURETTAGE/HYSTEROSCOPY WITH NOVASURE ABLATION;  Surgeon: Brayton Mars, MD;  Location: [facility];  Service: Gynecology;  Laterality: N/A;   LAPAROSCOPIC VAGINAL HYSTERECTOMY WITH SALPINGECTOMY Bilateral 03/24/2018   Procedure: LAPAROSCOPIC ASSISTED VAGINAL HYSTERECTOMY WITH BILATERAL SALPINGECTOMY;  Surgeon: Brayton Mars, MD;  Location: [facility];  Service: Gynecology;  Laterality: Bilateral;    Family History  Problem Relation Age of Onset   Heart failure Mother    Hypertension Mother    Thyroid disease Mother    Lung cancer Mother    Asthma Mother    Peripheral Artery Disease Mother        stents in legs   Diabetes Father    Hypertension Father    Hypertension Brother    Heart failure Maternal Aunt    Breast cancer Maternal Aunt        great   Crohn's disease  Maternal Aunt    Diabetes Maternal Grandmother    Cerebral aneurysm Maternal Grandmother    Ovarian cancer Neg Hx     Allergies  Allergen Reactions   Hepatitis B Virus Vaccines     Dysarthria  Latest Ref Rng & Units 03/23/2022   12:17 PM 03/12/2022    2:43 PM 01/30/2022    4:57 AM  CBC  WBC 4.0 - 10.5 K/uL 12.9  11.7  8.7   Hemoglobin 12.0 - 15.0 g/dL 14.1  13.8  11.1   Hematocrit 36.0 - 46.0 % 40.7  40.5  33.1   Platelets 150 - 400 K/uL 317  357  234       CMP     Component Value Date/Time   NA 139 03/12/2022 1443   NA 137 08/02/2017 1206   K 3.8 03/12/2022 1443   K 3.6 08/02/2017 1206   CL 107 03/12/2022 1443   CO2 24 03/12/2022 1443   CO2 23 08/02/2017 1206   GLUCOSE 96 03/12/2022 1443   GLUCOSE 86 08/02/2017 1206   BUN 9 03/12/2022 1443   BUN 8.8 08/02/2017 1206   CREATININE 0.65 03/12/2022 1443   CREATININE 0.7 08/02/2017 1206   CALCIUM 9.4 03/12/2022 1443   CALCIUM 8.9 08/02/2017 1206   PROT 6.5 03/12/2022 1443   PROT 7.2 08/02/2017 1206   ALBUMIN 2.5 (L) 01/29/2022 0348   ALBUMIN 3.5 08/02/2017 1206   AST 15 03/12/2022 1443   AST 16 02/24/2018 1028   AST 16 08/02/2017 1206   ALT 15 03/12/2022 1443   ALT 11 02/24/2018 1028   ALT 13 08/02/2017 1206   ALKPHOS 40 01/29/2022 0348   ALKPHOS 52 08/02/2017 1206   BILITOT 0.4 03/12/2022 1443   BILITOT 0.3 02/24/2018 1028   BILITOT 0.53 08/02/2017 1206   GFRNONAA >60 01/29/2022 0348   GFRNONAA 105 09/05/2020 1530   GFRAA 121 09/05/2020 1530     No results found.     Assessment & Plan:   1. Aneurysm of ascending aorta without rupture (HCC) Today the patient has evidence of an ascending aortic aneurysm of 4.8 cm.  Repair of ascending aortic aneurysm to close to about 6 cm.  This would typically need to be done by CT surgery but we can continue to follow until she develops closer to the size.  Currently no evidence of an abdominal aortic aneurysm.  Patient also advised to make sure she continues to  follow closely with her PCP in regards to hypertension.  We will have the patient return in 6 months following CT scan.   - CT ANGIO CHEST AORTA W/CM & OR WO/CM; Future  2. Tobacco user Discussed role smoking in regards to aneurysm.  Smoking cessation is advised.  3. Essential hypertension Continue antihypertensive medications as already ordered, these medications have been reviewed and there are no changes at this time.    Current Outpatient Medications on File Prior to Visit  Medication Sig Dispense Refill   aspirin EC 81 MG tablet Take 81 mg by mouth daily. Swallow whole.     losartan-hydrochlorothiazide (HYZAAR) 100-12.5 MG tablet Take 1 tablet by mouth daily. 90 tablet 3   No current facility-administered medications on file prior to visit.    There are no Patient Instructions on file for this visit. No follow-ups on file.   Kris Hartmann, NP

## 2022-06-18 ENCOUNTER — Ambulatory Visit (INDEPENDENT_AMBULATORY_CARE_PROVIDER_SITE_OTHER): Payer: Medicaid Other | Admitting: Pulmonary Disease

## 2022-06-18 ENCOUNTER — Encounter: Payer: Self-pay | Admitting: Family Medicine

## 2022-06-18 ENCOUNTER — Encounter: Payer: Self-pay | Admitting: Pulmonary Disease

## 2022-06-18 VITALS — BP 132/86 | HR 75 | Temp 97.7°F | Ht 63.0 in | Wt 226.6 lb

## 2022-06-18 DIAGNOSIS — F17210 Nicotine dependence, cigarettes, uncomplicated: Secondary | ICD-10-CM

## 2022-06-18 DIAGNOSIS — J189 Pneumonia, unspecified organism: Secondary | ICD-10-CM

## 2022-06-18 DIAGNOSIS — R918 Other nonspecific abnormal finding of lung field: Secondary | ICD-10-CM | POA: Diagnosis not present

## 2022-06-18 DIAGNOSIS — R0683 Snoring: Secondary | ICD-10-CM

## 2022-06-18 NOTE — Progress Notes (Signed)
Subjective:    Patient ID: Jill Davidson, female    DOB: 11/16/1970, 51 y.o.   MRN: [id number] Patient Care Team: Steele Sizer, MD as PCP - General [facility])  Chief Complaint  Patient presents with   Consult    Lung nodule. CT chest 03/27/2022. No SOB.   HPI Jill Davidson is a 51 year old current smoker (half PPD, 12.5 PY) who presents for evaluation of multiple lung nodules.  Patient is kindly referred by Dr. Steele Sizer.  These nodules were noted on a chest CT performed 27 March 2022.  There are multiple 2 to 3 mm nodules noted after an episode of severe community-acquired pneumonia in May for which she required admission.  The patient states that issue started after taking a second dose of hepatitis B vaccine on 11 May.  Developed generalized weakness, presyncope and fatigue.  He also developed slurred speech.  CT angio chest performed at that time showed a right lower lobe pneumonia and reactive hilar mediastinal adenopathy.  She had a follow-up CT performed on 11 July that showed resolution of the pneumonia with some reactive changes and tree-in-bud opacities on the right lower lobe and some scattered small nodules largest of which is 3 mm as are scattered in the lingula right apex and left upper lobe and medial left upper lobe.  No mediastinal adenopathy noted.  I suspect these are resolving changes from her pneumonia in May.  Currently she has no shortness of breath, no cough, no sputum production.  No hemoptysis.  No fevers, chills or sweats.  No chest pain, orthopnea or paroxysmal nocturnal dyspnea.  No lower extremity edema.  No calf tenderness.  She has not had any fatigue.  Her symptoms from admission in May have totally resolved.  She currently is employed as a Geophysicist/field seismologist for [employer].  She smokes half a pack of cigarettes per day.  She is precontemplative with regards to discontinuation of smoking.  She does endorse sleep symptoms to include nocturnal awakenings and snoring.  She is  to have a sleep study.  Review of Systems A 10 point review of systems was performed and it is as noted above otherwise negative.  Past Medical History:  Diagnosis Date   AR (allergic rhinitis)    Bacterial vaginosis    Complication of anesthesia 2008   PT STATES THAT DURING C-SECTION SHE WAS ADMINISTERED A GAS THAT CAUSED HER HEART TO BEAT IRREGULAR- PT HAS HAD NO PROBLEMS WITH THIS SINCE 2008   Dysrhythmia    DUE TO A GAS THAT WAS ADMINISTERED DURING A 2008 C-SECTION-NO PROBLEMS SINCE   Fibroid    GERD (gastroesophageal reflux disease)    OCC   Hypertension    IDA (iron deficiency anemia) 01/25/2015   UTI (lower urinary tract infection)    Past Surgical History:  Procedure Laterality Date   CESAREAN SECTION     X2   DILATION AND CURETTAGE OF UTERUS     DILITATION & CURRETTAGE/HYSTROSCOPY WITH NOVASURE ABLATION N/A 07/23/2016   Procedure: DILATATION & CURETTAGE/HYSTEROSCOPY WITH NOVASURE ABLATION;  Surgeon: Brayton Mars, MD;  Location: [facility] OR[facility];  Service: Gynecology;  Laterality: N/A;   LAPAROSCOPIC VAGINAL HYSTERECTOMY WITH SALPINGECTOMY Bilateral 03/24/2018   Procedure: LAPAROSCOPIC ASSISTED VAGINAL HYSTERECTOMY WITH BILATERAL SALPINGECTOMY;  Surgeon: Brayton Mars, MD;  Location: [facility] OR[facility];  Service: Gynecology;  Laterality: Bilateral;   Patient Active Problem List   Diagnosis Date Noted   Aneurysm of ascending aorta without rupture ([place]) 04/10/2022   Palpitations 04/10/2022  Abnormal CT scan of lung 04/10/2022   Dysarthria 01/29/2022   Adverse drug reaction 01/29/2022   CAP (community acquired pneumonia) 01/28/2022   Tobacco user 07/24/2018   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) 06/05/2018   Vasomotor symptoms due to menopause 05/06/2018   S/P laparoscopic assisted vaginal hysterectomy (LAVH) bilateral salpingectomy right oophorectomy 03/24/2018   Essential hypertension  07/25/2016   Status post endometrial ablation 07/23/2016   IDA (iron deficiency anemia) 01/25/2015   Family History  Problem Relation Age of Onset   Heart failure Mother    Hypertension Mother    Thyroid disease Mother    Lung cancer Mother    Asthma Mother    Peripheral Artery Disease Mother        stents in legs   Diabetes Father    Hypertension Father    Hypertension Brother    Heart failure Maternal Aunt    Breast cancer Maternal Aunt        great   Crohn's disease Maternal Aunt    Diabetes Maternal Grandmother    Cerebral aneurysm Maternal Grandmother    Ovarian cancer Neg Hx    Social History   Tobacco Use   Smoking status: Every Day    Packs/day: 0.50    Years: 25.00    Total pack years: 12.50    Types: Cigarettes    Start date: 07/03/1992   Smokeless tobacco: Never   Tobacco comments:    Down to 5 cigarettes daily   Substance Use Topics   Alcohol use: Yes    Comment: occasional 1 drink every few weeks   Allergies  Allergen Reactions   Hepatitis B Virus Vaccines     Dysarthria    Current Meds  Medication Sig   aspirin EC 81 MG tablet Take 81 mg by mouth daily. Swallow whole.   losartan-hydrochlorothiazide (HYZAAR) 100-12.5 MG tablet Take 1 tablet by mouth daily.   Immunization History  Administered Date(s) Administered   Influenza,inj,Quad PF,6+ Mos 09/10/2019, 09/05/2020, 05/09/2022   Influenza-Unspecified 07/18/2018   PFIZER(Purple Top)SARS-COV-2 Vaccination 01/08/2020, 01/30/2020   PPD Test 03/10/2021   Pneumococcal Polysaccharide-23 09/10/2019   Tdap 09/10/2019       Objective:   Physical Exam BP 132/86 (BP Location: Left Arm, Cuff Size: Normal)   Pulse 75   Temp 97.7 F (36.5 C)   Ht '5\' 3"'$  (1.6 m)   Wt 226 lb 9.6 oz (102.8 kg)   LMP 03/21/2018 (Exact Date)   SpO2 98%   BMI 40.14 kg/m  GENERAL: Obese woman, no acute distress, fully ambulatory.  No conversational dyspnea.  HEAD: Normocephalic, atraumatic.  EYES: Pupils equal, round,  reactive to light.  No scleral icterus.  MOUTH: Oral mucosa moist.  No thrush. NECK: Supple. No thyromegaly. Trachea midline. No JVD.  No adenopathy. PULMONARY: Good air entry bilaterally.  No adventitious sounds. CARDIOVASCULAR: S1 and S2. Regular rate and rhythm.  Cards normal. ABDOMEN: Benign. MUSCULOSKELETAL: No joint deformity, no clubbing, no edema.  NEUROLOGIC: No overt focal deficit, no gait disturbance, speech is fluent. SKIN: Intact,warm,dry. PSYCH: Mood and behavior normal.  I reviewed the images from CT angio chest 28 Jan 2022 and CT chest without contrast 27 March 2022    Assessment & Plan:     ICD-10-CM   1. Multiple lung nodules on CT  R91.8 CT CHEST WO CONTRAST   Changes likely residual from episode of pneumonia in May Repeat CT chest 6  months from July imaging Follow-up after CT done    2. Community acquired pneumonia of right lower lobe of lung - Resolved  J18.9    Resolving pneumonic infiltrates Follow-up CT as above    3. Tobacco dependence due to cigarettes  F17.210    Patient was counseled regards to discontinuation of smoking Enroll in lung cancer screening program at age 31 (August 2024)    4. Snoring  R06.83    Patient's sleep study has been ordered     Orders Placed This Encounter  Procedures   CT CHEST WO CONTRAST    Mid January    Standing Status:   Future    Standing Expiration Date:   06/19/2023    Order Specific Question:   Is patient pregnant?    Answer:   No    Order Specific Question:   Preferred imaging location?    Answer:   [facility]   We will see the patient in follow-up in MRI after CT chest is performed.  She is to contact us prior to that time should any new difficulties arise.  In the interim, the patient has been encouraged to engage in smoking cessation.  Renold Don, MD Advanced Bronchoscopy PCCM [facility]-    *This note was dictated using voice recognition software/Dragon.  Despite best  efforts to proofread, errors can occur which can change the meaning. Any transcriptional errors that result from this process are unintentional and may not be fully corrected at the time of dictation.

## 2022-06-18 NOTE — Patient Instructions (Signed)
The findings on your chest CT are likely related to the pneumonia you had in May.  These are evolving changes.  We will do a repeat CT in mid January.  We will see you in follow-up after the CT is done in mid January.  Once you turn 52 we can enroll you in the lung cancer screening program.  I recommend that you work on quitting smoking.  May be start the pills given to you for quitting smoking on a weekend when you are not working and if you can tolerate them then this should not be an issue with you driving.  But it is important that you quit smoking.

## 2022-06-18 NOTE — Telephone Encounter (Signed)
Copied from [place] [id number]. Topic: Referral - Status >> Jun 18, 2022 10:11 AM Sabas Sous wrote: Reason for CRM: Pt called stating that she needs a new sleep study referral, she originally had one requested back in June/July. However she has yet to follow up, please advise. It may need to be reordered.   Best contact: [phone]

## 2022-06-20 ENCOUNTER — Telehealth (INDEPENDENT_AMBULATORY_CARE_PROVIDER_SITE_OTHER): Payer: Self-pay | Admitting: Nurse Practitioner

## 2022-06-20 NOTE — Telephone Encounter (Signed)
LVM for pt to call radiology scheduling at [phone] and schedule CT. After making the CT appt, I advised to call back to [facility] and make a CT results appt with one of our MD;s.

## 2022-06-21 ENCOUNTER — Emergency Department: Payer: Medicaid Other

## 2022-06-21 ENCOUNTER — Other Ambulatory Visit: Payer: Self-pay

## 2022-06-21 ENCOUNTER — Emergency Department
Admission: EM | Admit: 2022-06-21 | Discharge: 2022-06-21 | Disposition: A | Discharge status: Home / Self Care | Payer: PRIVATE HEALTH INSURANCE | Attending: Emergency Medicine | Admitting: Emergency Medicine

## 2022-06-21 ENCOUNTER — Encounter: Payer: Self-pay | Admitting: Hematology and Oncology

## 2022-06-21 DIAGNOSIS — Y92410 Unspecified street and highway as the place of occurrence of the external cause: Secondary | ICD-10-CM | POA: Diagnosis not present

## 2022-06-21 DIAGNOSIS — M542 Cervicalgia: Secondary | ICD-10-CM | POA: Insufficient documentation

## 2022-06-21 DIAGNOSIS — M545 Low back pain, unspecified: Secondary | ICD-10-CM | POA: Diagnosis not present

## 2022-06-21 DIAGNOSIS — M25511 Pain in right shoulder: Secondary | ICD-10-CM | POA: Diagnosis not present

## 2022-06-21 DIAGNOSIS — R079 Chest pain, unspecified: Secondary | ICD-10-CM | POA: Diagnosis present

## 2022-06-21 DIAGNOSIS — M25512 Pain in left shoulder: Secondary | ICD-10-CM | POA: Insufficient documentation

## 2022-06-21 DIAGNOSIS — I10 Essential (primary) hypertension: Secondary | ICD-10-CM | POA: Diagnosis not present

## 2022-06-21 MED ORDER — NAPROXEN 500 MG PO TABS: 500.0000 mg | ORAL_TABLET | Freq: Two times a day (BID) | ORAL | 0 refills | Status: AC

## 2022-06-21 MED ORDER — CYCLOBENZAPRINE HCL 10 MG PO TABS: 10.0000 mg | ORAL_TABLET | Freq: Three times a day (TID) | ORAL | 0 refills | Status: AC | PRN
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
## 2022-06-21 NOTE — Discharge Instructions (Addendum)
-  You may treat the pain with Tylenol and/or naproxen as needed.  You may additionally take cyclobenzaprine for muscle relaxation, though use caution as it may make you dizzy/drowsy.  -Follow-up with your primary care provider as needed.  -Return to the emergency department anytime if you begin to experience any new or worsening symptoms.
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
## 2022-06-22 ENCOUNTER — Other Ambulatory Visit: Payer: Medicaid Other

## 2022-06-22 ENCOUNTER — Inpatient Hospital Stay: Admission: RE | Admit: 2022-06-22 | Payer: Medicaid Other | Source: Ambulatory Visit

## 2022-06-22 ENCOUNTER — Ambulatory Visit: Payer: Medicaid Other | Admitting: Family Medicine

## 2022-06-22 ENCOUNTER — Telehealth: Payer: Self-pay | Admitting: Family Medicine

## 2022-06-22 NOTE — Telephone Encounter (Signed)
Pt is calling to request a written referral to the chiropractor. Please advise BC- [phone]

## 2022-06-25 ENCOUNTER — Ambulatory Visit (INDEPENDENT_AMBULATORY_CARE_PROVIDER_SITE_OTHER): Payer: Self-pay | Admitting: Family Medicine

## 2022-06-25 ENCOUNTER — Encounter: Payer: Self-pay | Admitting: Family Medicine

## 2022-06-25 DIAGNOSIS — R0789 Other chest pain: Secondary | ICD-10-CM

## 2022-06-25 DIAGNOSIS — M545 Low back pain, unspecified: Secondary | ICD-10-CM

## 2022-06-25 DIAGNOSIS — M542 Cervicalgia: Secondary | ICD-10-CM
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
## 2022-06-29 ENCOUNTER — Other Ambulatory Visit: Payer: Self-pay | Admitting: Family Medicine

## 2022-06-29 DIAGNOSIS — R590 Localized enlarged lymph nodes: Secondary | ICD-10-CM

## 2022-06-29 DIAGNOSIS — R9389 Abnormal findings on diagnostic imaging of other specified body structures: Secondary | ICD-10-CM

## 2022-07-12 ENCOUNTER — Ambulatory Visit
Admission: RE | Admit: 2022-07-12 | Discharge: 2022-07-12 | Disposition: A | Discharge status: Home / Self Care | Payer: Medicaid Other | Source: Ambulatory Visit | Attending: Family Medicine | Admitting: Family Medicine

## 2022-07-12 ENCOUNTER — Ambulatory Visit: Payer: Medicaid Other | Attending: Internal Medicine

## 2022-07-12 DIAGNOSIS — R92323 Mammographic fibroglandular density, bilateral breasts: Secondary | ICD-10-CM | POA: Diagnosis not present

## 2022-07-12 DIAGNOSIS — N63 Unspecified lump in unspecified breast: Secondary | ICD-10-CM

## 2022-07-12 DIAGNOSIS — I7121 Aneurysm of the ascending aorta, without rupture: Secondary | ICD-10-CM | POA: Diagnosis not present

## 2022-07-12 LAB — ECHOCARDIOGRAM COMPLETE
AR max vel: 3.02 cm2
AV Area VTI: 3.14 cm2
AV Area mean vel: 2.42 cm2
AV Mean grad: 5 mmHg
AV Peak grad: 9.2 mmHg
Ao pk vel: 1.52 m/s
Area-P 1/2: 3.91 cm2
Calc EF: 55.4 %
S' Lateral: 3 cm
Single Plane A2C EF: 56.9 %
Single Plane A4C EF: 53.5 %

## 2022-07-16 ENCOUNTER — Encounter (INDEPENDENT_AMBULATORY_CARE_PROVIDER_SITE_OTHER): Payer: Self-pay

## 2022-07-19 ENCOUNTER — Encounter: Payer: Self-pay | Admitting: Hematology and Oncology

## 2022-09-20 ENCOUNTER — Other Ambulatory Visit
Admission: RE | Admit: 2022-09-20 | Discharge: 2022-09-20 | Disposition: A | Discharge status: Home / Self Care | Payer: 59 | Source: Ambulatory Visit | Attending: Internal Medicine | Admitting: Internal Medicine

## 2022-09-20 ENCOUNTER — Ambulatory Visit: Payer: Medicaid Other | Attending: Internal Medicine | Admitting: Internal Medicine

## 2022-09-20 ENCOUNTER — Encounter: Payer: Self-pay | Admitting: Internal Medicine

## 2022-09-20 VITALS — BP 144/90 | HR 75 | Ht 64.0 in | Wt 231.6 lb

## 2022-09-20 DIAGNOSIS — I10 Essential (primary) hypertension: Secondary | ICD-10-CM

## 2022-09-20 DIAGNOSIS — R252 Cramp and spasm: Secondary | ICD-10-CM | POA: Insufficient documentation

## 2022-09-20 DIAGNOSIS — R002 Palpitations: Secondary | ICD-10-CM | POA: Diagnosis not present

## 2022-09-20 DIAGNOSIS — I7121 Aneurysm of the ascending aorta, without rupture: Secondary | ICD-10-CM

## 2022-09-20 LAB — BASIC METABOLIC PANEL
Anion gap: 11 (ref 5–15)
BUN: 12 mg/dL (ref 6–20)
CO2: 25 mmol/L (ref 22–32)
Calcium: 9.3 mg/dL (ref 8.9–10.3)
Chloride: 101 mmol/L (ref 98–111)
Creatinine, Ser: 0.63 mg/dL (ref 0.44–1.00)
GFR, Estimated: >60 mL/min (ref >60)
Glucose, Bld: 86 mg/dL (ref 70–99)
Potassium: 3.4 mmol/L — ABNORMAL LOW (ref 3.5–5.1)
Sodium: 137 mmol/L (ref 135–145)

## 2022-09-20 LAB — MAGNESIUM: Magnesium: 1.8 mg/dL (ref 1.7–2.4)

## 2022-09-20 NOTE — Patient Instructions (Addendum)
Medication Instructions:  Your Physician recommend you continue on your current medication as directed.    *If you need a refill on your cardiac medications before your next appointment, please call your pharmacy*   Lab Work: Your provider would like for you to have following labs drawn: (BMP, Magnesium).   Please go to the [facility] entrance and check in at the front desk.  You do not need an appointment.  They are open from 7am-6 pm.   If you have labs (blood work) drawn today and your tests are completely normal, you will receive your results only by: [place] (if you have MyChart) OR A paper copy in the mail If you have any lab test that is abnormal or we need to change your treatment, we will call you to review the results.   Testing/Procedures: None ordered today   Follow-Up: At [facility], you and your health needs are our priority.  As part of our continuing mission to provide you with exceptional heart care, we have created designated Provider Care Teams.  These Care Teams include your primary Cardiologist (physician) and Advanced Practice Providers (APPs -  Physician Assistants and Nurse Practitioners) who all work together to provide you with the care you need, when you need it.  We recommend signing up for the patient portal called "MyChart".  Sign up information is provided on this After Visit Summary.  MyChart is used to connect with patients for Virtual Visits (Telemedicine).  Patients are able to view lab/test results, encounter notes, upcoming appointments, etc.  Non-urgent messages can be sent to your provider as well.   To learn more about what you can do with MyChart, go to [email or web address].    Your next appointment:   4 month(s)  The format for your next appointment:   In Person  Provider:   You may see Nelva Bush, MD or one of the following Advanced Practice Providers on your designated Care Team:   Murray Hodgkins,  NP Christell Faith, PA-C Cadence Kathlen Mody, PA-C Gerrie Nordmann, NP

## 2022-09-20 NOTE — Progress Notes (Signed)
Follow-up Outpatient Visit Date: 09/20/2022  Primary Care Provider: Steele Sizer, [address] [place] [address]  Chief Complaint: Follow-up palpitations  HPI:  Jill Davidson is a 52 y.o. female with history of hypertension, iron deficiency anemia, uterine fibroids, and thoracic aortic aneurysm (incidentally noted on CT chest, followed by vascular surgery), who presents for follow-up of palpitations.  I met her in September, at which time she recounted a single episode of palpitations that woke her up in the middle of the night from sleep.  She has otherwise feeling well from a heart standpoint.  We agreed to obtain an echocardiogram to exclude structural abnormalities underlying her palpitations and thoracic aortic aneurysm.  No significant abnormalities were seen, demonstrating normal LVEF with grade 1 diastolic dysfunction, mild mitral regurgitation, and borderline dilated ascending aorta.  Today, [name] reports that she has been feeling fairly well from a heart standpoint.  She has not had any further palpitations.  She also denies chest pain, shortness of breath, and lightheadedness.  She has been bothered by leg cramps at night, worse on the right.  She does not have leg pain with walking.  She is trying to quit smoking and is down to 4 cigarettes/day.  She is also trying to minimize her caffeine intake.  --------------------------------------------------------------------------------------------------  Cardiovascular History & Procedures: Cardiovascular Problems: Palpitations Thoracic aortic aneurysm   Risk Factors: Hypertension, vascular disease, obesity, and tobacco use   Cath/PCI: None   CV Surgery: None   EP Procedures and Devices: None   Non-Invasive Evaluation(s): TTE (07/12/2022): Normal LV size and wall thickness.  LVEF 60-65% with grade 1 diastolic dysfunction.  Normal RV size and function.  Mild mitral regurgitation.  Borderline dilation of  the ascending aorta, measuring 3.5 cm.  Recent CV Pertinent Labs: Lab Results  Component Value Date   CHOL 134 01/30/2022   CHOL 138 08/15/2015   HDL <10 (L) 01/30/2022   HDL 45 08/15/2015   LDLCALC NOT CALCULATED 01/30/2022   LDLCALC 101 (H) 09/05/2020   TRIG 226 (H) 01/30/2022   CHOLHDL NOT CALCULATED 01/30/2022   INR 1.2 01/28/2022   K 3.8 03/12/2022   K 3.6 08/02/2017   MG 2.2 01/28/2022   BUN 9 03/12/2022   BUN 8.8 08/02/2017   CREATININE 0.65 03/12/2022   CREATININE 0.7 08/02/2017    Past medical and surgical history were reviewed and updated in EPIC.  Current Meds  Medication Sig   aspirin EC 81 MG tablet Take 81 mg by mouth daily. Swallow whole.   losartan-hydrochlorothiazide (HYZAAR) 100-12.5 MG tablet Take 1 tablet by mouth daily.    Allergies: Hepatitis b virus vaccines  Social History   Tobacco Use   Smoking status: Every Day    Packs/day: 0.25    Years: 25.00    Total pack years: 6.25    Types: Cigarettes    Start date: 07/03/1992   Smokeless tobacco: Never  Vaping Use   Vaping Use: Never used  Substance Use Topics   Alcohol use: Yes    Comment: occasional 1 drink every few weeks   Drug use: Not Currently    Types: Marijuana    Comment: None in over a year    Family History  Problem Relation Age of Onset   Heart failure Mother    Hypertension Mother    Thyroid disease Mother    Lung cancer Mother    Asthma Mother    Peripheral Artery Disease Mother  stents in legs   Diabetes Father    Hypertension Father    Hypertension Brother    Heart failure Maternal Aunt    Breast cancer Maternal Aunt        great   Crohn's disease Maternal Aunt    Diabetes Maternal Grandmother    Cerebral aneurysm Maternal Grandmother    Ovarian cancer Neg Hx     Review of Systems: A 12-system review of systems was performed and was negative except as noted in the  HPI.  --------------------------------------------------------------------------------------------------  Physical Exam: BP (!) 142/90 (BP Location: Left Arm, Patient Position: Sitting)   Pulse 75   Ht _0  (1.626 m)   Wt 231 lb 9.6 oz (105.1 kg)   LMP 03/21/2018 (Exact Date)   SpO2 98%   BMI 39.75 kg/m  Repeat BP: 144/90  General:  NAD. Neck: No JVD or HJR, though body habitus limits evaluation. Lungs: Clear to auscultation bilaterally without wheezes or crackles. Heart: Regular rate and rhythm without murmurs, rubs, or gallops. Abdomen: Soft, nontender, nondistended. Extremities: No lower extremity edema.  Posterior tibial and dorsalis pedis pulses are 2+ bilaterally.  Lab Results  Component Value Date   WBC 12.9 (H) 03/23/2022   HGB 14.1 03/23/2022   HCT 40.7 03/23/2022   MCV 90.0 03/23/2022   PLT 317 03/23/2022    Lab Results  Component Value Date   NA 139 03/12/2022   K 3.8 03/12/2022   CL 107 03/12/2022   CO2 24 03/12/2022   BUN 9 03/12/2022   CREATININE 0.65 03/12/2022   GLUCOSE 96 03/12/2022   ALT 15 03/12/2022    Lab Results  Component Value Date   CHOL 134 01/30/2022   HDL <10 (L) 01/30/2022   LDLCALC NOT CALCULATED 01/30/2022   TRIG 226 (H) 01/30/2022   CHOLHDL NOT CALCULATED 01/30/2022    --------------------------------------------------------------------------------------------------  ASSESSMENT AND PLAN: Palpitations: No recurrence.  Echocardiogram reassuring without significant structural abnormality.  No further workup planned unless palpitations recur.  I encouraged Ms. Kidney to continue minimizing her caffeine intake.  Leg cramps: Pain not consistent with claudication.  Pedal pulses are normal on exam today.  I encouraged Ms. Tietze to stay well-hydrated.  We will check a BMP and magnesium level to ensure that her electrolytes are not abnormal in the setting of losartan-HCTZ therapy.  Hypertension: Blood pressure mildly elevated today  but better on prior checks in October.  I encouraged Ms. Wilmot to minimize her sodium intake and to monitor her blood pressure at home.  Continue current dose of losartan-HCTZ.  Ascending aortic aneurysm: No symptoms reported.  Borderline dilation of ascending aorta noted on echo.  Continue previously arranged follow-up with vascular surgery.  Follow-up: Return to clinic in 4 months.  Nelva Bush, MD 09/20/2022 4:28 PM

## 2022-09-21 ENCOUNTER — Encounter: Payer: Self-pay | Admitting: Internal Medicine

## 2022-09-21 DIAGNOSIS — R252 Cramp and spasm: Secondary | ICD-10-CM | POA: Insufficient documentation

## 2022-09-26 ENCOUNTER — Encounter: Payer: Self-pay | Admitting: Hematology and Oncology

## 2022-09-28 ENCOUNTER — Ambulatory Visit: Payer: 59

## 2022-10-02 ENCOUNTER — Telehealth: Payer: Self-pay | Admitting: Pulmonary Disease

## 2022-10-02 DIAGNOSIS — R918 Other nonspecific abnormal finding of lung field: Secondary | ICD-10-CM

## 2022-10-02 NOTE — Telephone Encounter (Signed)
May send copy of my note from October which was the only time I have seen her previously.

## 2022-10-02 NOTE — Telephone Encounter (Signed)
We received letter from [facility] stating the CT that was scheduled on 09/28/22 has been denied.  The next step was to fax records to see if they would do a reconsideration

## 2022-10-05 NOTE — Telephone Encounter (Signed)
Thank you agree she needs the study.

## 2022-10-05 NOTE — Telephone Encounter (Signed)
CT with contrast has been ordered.

## 2022-10-05 NOTE — Telephone Encounter (Signed)
Dr. Patsey Berthold I called about the Reconsideration for this patient. [facility] stated the patient meets the criteria for Chest CT with contrast and they will  approve [phone]

## 2022-10-10 ENCOUNTER — Ambulatory Visit
Admission: RE | Admit: 2022-10-10 | Discharge: 2022-10-10 | Disposition: A | Discharge status: Home / Self Care | Payer: Medicaid Other | Source: Ambulatory Visit | Attending: Internal Medicine | Admitting: Internal Medicine

## 2022-10-10 DIAGNOSIS — R918 Other nonspecific abnormal finding of lung field: Secondary | ICD-10-CM

## 2022-10-10 MED ORDER — IOPAMIDOL (ISOVUE-300) INJECTION 61%
75.0000 mL | Freq: Once | INTRAVENOUS | Status: AC | PRN
  Administered 2022-10-10: 75 mL via INTRAVENOUS

## 2022-10-12 ENCOUNTER — Other Ambulatory Visit: Payer: Self-pay

## 2022-10-12 DIAGNOSIS — R918 Other nonspecific abnormal finding of lung field: Secondary | ICD-10-CM

## 2022-10-26 ENCOUNTER — Encounter: Payer: Self-pay | Admitting: Hematology and Oncology

## 2022-11-05 ENCOUNTER — Telehealth (INDEPENDENT_AMBULATORY_CARE_PROVIDER_SITE_OTHER): Payer: Self-pay | Admitting: Vascular Surgery

## 2022-11-05 NOTE — Telephone Encounter (Signed)
LVM for pt advising that I had gotten a prior auth for the CT ordered by Dr. Delana Meyer for March 2024. I did advise pt that her insurance did not approve having the CT at [facility]. They will approve the CT at:  [address], [place] [address] (P): [phone]  Advised pt to call with any questions. I did also advise that she will need to have her CT before her follow up appt with Dr. Delana Meyer before her 3.27.24 appt.

## 2022-11-06 NOTE — Progress Notes (Unsigned)
Name: Jill Davidson   MRN: [id number]    DOB: 1971/03/23   Date:11/07/2022       Progress Note  Subjective  Chief Complaint  Discuss CT Results  HPI  Abnormal Thyroid CT and enlarged thymus: incidental findings on CT chest ordered by Dr. Duwayne Heck. She denies dysphagia, no hair loss, change in bowel movements. 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) 06/05/2018   Vasomotor symptoms due to menopause 05/06/2018   S/P laparoscopic assisted vaginal hysterectomy (LAVH) bilateral salpingectomy right oophorectomy 03/24/2018   Essential hypertension 07/25/2016   Status post endometrial ablation 07/23/2016   IDA (iron deficiency anemia) 01/25/2015    Past Surgical History:  Procedure Laterality Date   CESAREAN SECTION     X2   DILATION AND CURETTAGE OF UTERUS     DILITATION & CURRETTAGE/HYSTROSCOPY WITH NOVASURE ABLATION N/A 07/23/2016   Procedure: DILATATION & CURETTAGE/HYSTEROSCOPY WITH NOVASURE ABLATION;  Surgeon: Brayton Mars, MD;  Location: [facility];  Service: Gynecology;  Laterality: N/A;   LAPAROSCOPIC VAGINAL HYSTERECTOMY WITH SALPINGECTOMY Bilateral 03/24/2018   Procedure: LAPAROSCOPIC ASSISTED VAGINAL HYSTERECTOMY WITH BILATERAL SALPINGECTOMY;  Surgeon: Brayton Mars, MD;  Location: [facility];  Service: Gynecology;  Laterality: Bilateral;    Family History  Problem Relation Age of Onset   Heart  failure Mother    Hypertension Mother    Thyroid disease Mother    Lung cancer Mother    Asthma Mother    Peripheral Artery Disease Mother        stents in legs   Diabetes Father    Hypertension Father    Hypertension Brother    Heart failure Maternal Aunt    Breast cancer Maternal Aunt        great   Crohn's disease Maternal Aunt    Diabetes Maternal Grandmother    Cerebral aneurysm Maternal Grandmother    Ovarian cancer Neg Hx     Social History   Tobacco Use   Smoking status: Every Day    Packs/day: 0.25    Years: 25.00    Total pack years: 6.25    Types: Cigarettes    Start date: 07/03/1992   Smokeless tobacco: Never  Substance Use Topics   Alcohol use: Yes    Comment: occasional 1 drink every few weeks     Current Outpatient Medications:    aspirin EC 81 MG tablet, Take 81 mg by mouth daily. Swallow whole., Disp: , Rfl:    losartan-hydrochlorothiazide (HYZAAR) 100-12.5 MG tablet, Take 1 tablet by mouth daily., Disp: 90 tablet, Rfl: 3  Allergies  Allergen Reactions   Hepatitis B Virus Vaccines     Dysarthria     I personally reviewed active problem list, medication list, allergies, family history, social history, health maintenance with the patient/caregiver today.   ROS  Ten systems reviewed and is negative except as mentioned in HPI   Objective  Vitals:  11/07/22 1440  BP: 138/78  Pulse: 95  Resp: 16  Temp: 98.1 F (36.7 C)  TempSrc: Oral  SpO2: 100%  Weight: 227 lb 12.8 oz (103.3 kg)  Height: 5' 3"$  (1.6 m)    Body mass index is 40.35 kg/m.  Physical Exam  Constitutional: Patient appears well-developed and well-nourished. Obese  No distress.  HEENT: head atraumatic, normocephalic, pupils equal and reactive to light, neck supple, thyroid not felt  Cardiovascular: Normal rate, regular rhythm and normal heart sounds.  No murmur heard. No BLE edema. Pulmonary/Chest: Effort normal and breath sounds normal. No respiratory  distress. Abdominal: Soft.  There is no tenderness. Psychiatric: Patient has a normal mood and affect. behavior is normal. Judgment and thought content normal.   Recent Results (from the past 2160 hour(s))  Magnesium     Status: None   Collection Time: 09/20/22  5:05 PM  Result Value Ref Range   Magnesium 1.8 1.7 - 2.4 mg/dL    Comment: Performed at [facility], [address] [place], [place] [id number]  Basic metabolic panel     Status: Abnormal   Collection Time: 09/20/22  5:05 PM  Result Value Ref Range   Sodium 137 135 - 145 mmol/L   Potassium 3.4 (L) 3.5 - 5.1 mmol/L   Chloride 101 98 - 111 mmol/L   CO2 25 22 - 32 mmol/L   Glucose, Bld 86 70 - 99 mg/dL    Comment: Glucose reference range applies only to samples taken after fasting for at least 8 hours.   BUN 12 6 - 20 mg/dL   Creatinine, Ser 0.63 0.44 - 1.00 mg/dL   Calcium 9.3 8.9 - 10.3 mg/dL   GFR, Estimated >60 >60 mL/min    Comment: (NOTE) Calculated using the CKD-EPI Creatinine Equation (2021)    Anion gap 11 5 - 15    Comment: Performed at [facility], [address] [place], [place] [address]    PHQ2/9:    11/07/2022    2:47 PM 06/25/2022   10:14 AM 05/09/2022   10:15 AM 04/10/2022    2:42 PM 03/14/2022    9:39 AM  Depression screen PHQ 2/9  Decreased Interest 0 3 0 0 0  Down, Depressed, Hopeless 0 0 0 0 0  PHQ - 2 Score 0 3 0 0 0  Altered sleeping 0 3 3 0 0  Tired, decreased energy 0 1 3 0 3  Change in appetite 0 3 3 0 3  Feeling bad or failure about yourself  0 0 0 0 0  Trouble concentrating 0 3 0 0 0  Moving slowly or fidgety/restless 0 0 0 0 0  Suicidal thoughts 0 0 0 0 0  PHQ-9 Score 0 13 9 0 6  Difficult doing work/chores    Not difficult at all     phq 9 is negative   Fall Risk:    11/07/2022    2:40 PM 06/25/2022   10:08 AM 05/09/2022   10:15 AM 04/10/2022    2:42 PM 03/14/2022    9:35 AM  Fall Risk   Falls in the past year? 0 1 1 1 1  $ Number falls in past yr:  1 1 1  1  $ Injury with Fall?  1 0 0 0  Risk for fall due to : No Fall Risks No Fall Risks Impaired balance/gait;Impaired mobility Impaired balance/gait;Impaired mobility No Fall Risks  Follow up Falls prevention discussed Falls prevention discussed Falls prevention discussed Falls prevention  discussed;Education provided Falls prevention discussed     Assessment & Plan  1. Abnormal CT of the chest  - Ambulatory referral to Endocrinology  2. Thyroid nodule  - Ambulatory referral to Endocrinology  3. Screen for colon cancer  - Ambulatory referral to Gastroenterology  4. Skin cyst  - Ambulatory referral to General Surgery

## 2022-11-07 ENCOUNTER — Encounter: Payer: Self-pay | Admitting: Family Medicine

## 2022-11-07 ENCOUNTER — Ambulatory Visit: Payer: 59 | Admitting: Family Medicine

## 2022-11-07 ENCOUNTER — Telehealth: Payer: Self-pay

## 2022-11-07 VITALS — BP 138/78 | HR 95 | Temp 98.1°F | Resp 16 | Ht 63.0 in | Wt 227.8 lb

## 2022-11-07 DIAGNOSIS — L729 Follicular cyst of the skin and subcutaneous tissue, unspecified: Secondary | ICD-10-CM | POA: Diagnosis not present

## 2022-11-07 DIAGNOSIS — E041 Nontoxic single thyroid nodule: Secondary | ICD-10-CM

## 2022-11-07 DIAGNOSIS — Z1211 Encounter for screening for malignant neoplasm of colon: Secondary | ICD-10-CM

## 2022-11-07 DIAGNOSIS — R9389 Abnormal findings on diagnostic imaging of other specified body structures: Secondary | ICD-10-CM | POA: Diagnosis not present

## 2022-11-07 NOTE — Telephone Encounter (Signed)
Patient calling to schedule colonoscopy.

## 2022-11-08 ENCOUNTER — Other Ambulatory Visit: Payer: Self-pay | Admitting: *Deleted

## 2022-11-08 ENCOUNTER — Encounter: Payer: Self-pay | Admitting: Hematology and Oncology

## 2022-11-08 DIAGNOSIS — Z1211 Encounter for screening for malignant neoplasm of colon: Secondary | ICD-10-CM

## 2022-11-08 MED ORDER — NA SULFATE-K SULFATE-MG SULF 17.5-3.13-1.6 GM/177ML PO SOLN: 1.0000 | Freq: Once | ORAL | 0 refills | Status: AC

## 2022-11-08 NOTE — Addendum Note (Signed)
Addended by: Jacqualin Combes on: 11/08/2022 10:24 AM   Modules accepted: Orders

## 2022-11-08 NOTE — Telephone Encounter (Signed)
Gastroenterology Pre-Procedure Review  Request Date: 11/26/2022 Requesting Physician: Dr. Marius Ditch  PATIENT REVIEW QUESTIONS: The patient responded to the following health history questions as indicated:    1. Are you having any GI issues? no 2. Do you have a personal history of Polyps? no 3. Do you have a family history of Colon Cancer or Polyps? no 4. Diabetes Mellitus? no 5. Joint replacements in the past 12 months?no 6. Major health problems in the past 3 months?no 7. Any artificial heart valves, MVP, or defibrillator?no    MEDICATIONS & ALLERGIES:    Patient reports the following regarding taking any anticoagulation/antiplatelet therapy:   Plavix, Coumadin, Eliquis, Xarelto, Lovenox, Pradaxa, Brilinta, or Effient? no Aspirin? yes (81 mg)  Patient confirms/reports the following medications:  Current Outpatient Medications  Medication Sig Dispense Refill   aspirin EC 81 MG tablet Take 81 mg by mouth daily. Swallow whole.     losartan-hydrochlorothiazide (HYZAAR) 100-12.5 MG tablet Take 1 tablet by mouth daily. 90 tablet 3   No current facility-administered medications for this visit.    Patient confirms/reports the following allergies:  Allergies  Allergen Reactions   Hepatitis B Virus Vaccines     Dysarthria     No orders of the defined types were placed in this encounter.   AUTHORIZATION INFORMATION Primary Insurance: 1D#: Group #:  Secondary Insurance: 1D#: Group #:  SCHEDULE INFORMATION: Date:  Time: Location:

## 2022-11-12 ENCOUNTER — Encounter: Payer: Self-pay | Admitting: Hematology and Oncology

## 2022-11-15 ENCOUNTER — Telehealth: Payer: Self-pay | Admitting: Internal Medicine

## 2022-11-15 NOTE — Telephone Encounter (Signed)
   Pre-operative Risk Assessment    Patient Name: Jill Davidson  DOB: 15-Jun-1971 MRN: [id number]      Request for Surgical Clearance    Procedure:  Colonoscopy  Date of Surgery:  Clearance 11/26/22                                 Surgeon:  not listed Surgeon's Group or Practice Name:  [facility] Phone number:  [phone] Fax number:  [phone]   Type of Clearance Requested:   - Medical    Type of Anesthesia:  General    Additional requests/questions:    Signed, Maxwell Caul   11/15/2022, 1:37 PM

## 2022-11-19 ENCOUNTER — Ambulatory Visit: Payer: 59 | Admitting: Surgery

## 2022-11-19 NOTE — Telephone Encounter (Signed)
Left message to call back for tele pre op appt 

## 2022-11-19 NOTE — Telephone Encounter (Signed)
Jill Davidson did not report any unstable cardiac symptoms at our visit in 09/2022.  As long as she has not developed any new symptoms like chest pain or shortness of breath in the meantime, it is fine for her to proceed with planned colonoscopy (low risk procedure) without additional cardiac testing or intervention.  Jill Bush, MD [facility]

## 2022-11-19 NOTE — Telephone Encounter (Signed)
   Name: Jill Davidson  DOB: [birth year]  MRN: [id number]  Primary Cardiologist: None   Preoperative team, please contact this patient and set up a phone call appointment for further preoperative risk assessment. Please obtain consent and complete medication review. Thank you for your help.  I confirm that guidance regarding antiplatelet and oral anticoagulation therapy has been completed and, if necessary, noted below.  None requested.   Deberah Pelton, NP 11/19/2022, 11:19 AM [facility]

## 2022-11-20 ENCOUNTER — Telehealth: Payer: Self-pay | Admitting: *Deleted

## 2022-11-20 NOTE — Telephone Encounter (Signed)
I s/w the pt and she has been added on to 11/22/22 3:20 for pre op clearance. Procedure is 11/26/22 (Monday). Pt is on ASA, though not listed to held. Med rec and consent are done.

## 2022-11-20 NOTE — Telephone Encounter (Signed)
I s/w the pt and she has been added on to 11/22/22 3:20 for pre op clearance. Procedure is 11/26/22 (Monday). Pt is on ASA, though not listed to held. Med rec and consent are done.      Patient Consent for Virtual Visit        Jill Davidson has provided verbal consent on 11/20/2022 for a virtual visit (video or telephone).   CONSENT FOR VIRTUAL VISIT FOR:  Jill Davidson  By participating in this virtual visit I agree to the following:  I hereby voluntarily request, consent and authorize [facility] and its employed or contracted physicians, physician assistants, nurse practitioners or other licensed health care professionals (the Practitioner), to provide me with telemedicine health care services (the "Services") as deemed necessary by the treating Practitioner. I acknowledge and consent to receive the Services by the Practitioner via telemedicine. I understand that the telemedicine visit will involve communicating with the Practitioner through live audiovisual communication technology and the disclosure of certain medical information by electronic transmission. I acknowledge that I have been given the opportunity to request an in-person assessment or other available alternative prior to the telemedicine visit and am voluntarily participating in the telemedicine visit.  I understand that I have the right to withhold or withdraw my consent to the use of telemedicine in the course of my care at any time, without affecting my right to future care or treatment, and that the Practitioner or I may terminate the telemedicine visit at any time. I understand that I have the right to inspect all information obtained and/or recorded in the course of the telemedicine visit and may receive copies of available information for a reasonable fee.  I understand that some of the potential risks of receiving the Services via telemedicine include:  Delay or interruption in medical evaluation due to technological  equipment failure or disruption; Information transmitted may not be sufficient (e.g. poor resolution of images) to allow for appropriate medical decision making by the Practitioner; and/or  In rare instances, security protocols could fail, causing a breach of personal health information.  Furthermore, I acknowledge that it is my responsibility to provide information about my medical history, conditions and care that is complete and accurate to the best of my ability. I acknowledge that Practitioner's advice, recommendations, and/or decision may be based on factors not within their control, such as incomplete or inaccurate data provided by me or distortions of diagnostic images or specimens that may result from electronic transmissions. I understand that the practice of medicine is not an exact science and that Practitioner makes no warranties or guarantees regarding treatment outcomes. I acknowledge that a copy of this consent can be made available to me via my patient portal ([facility]), or I can request a printed copy by calling the office of [facility].    I understand that my insurance will be billed for this visit.   I have read or had this consent read to me. I understand the contents of this consent, which adequately explains the benefits and risks of the Services being provided via telemedicine.  I have been provided ample opportunity to ask questions regarding this consent and the Services and have had my questions answered to my satisfaction. I give my informed consent for the services to be provided through the use of telemedicine in my medical care

## 2022-11-22 ENCOUNTER — Encounter: Payer: Self-pay | Admitting: Hematology and Oncology

## 2022-11-22 ENCOUNTER — Ambulatory Visit: Payer: 59 | Attending: Cardiovascular Disease | Admitting: Cardiology

## 2022-11-22 NOTE — Progress Notes (Signed)
Virtual Visit via Telephone Note   Because of Jill Davidson's co-morbid illnesses, she is at least at moderate risk for complications without adequate follow up.  This format is felt to be most appropriate for this patient at this time.  The patient did not have access to video technology/had technical difficulties with video requiring transitioning to audio format only (telephone).  All issues noted in this document were discussed and addressed.  No physical exam could be performed with this format.  Please refer to the patient's chart for her consent to telehealth for [facility].  Evaluation Performed:  Preoperative cardiovascular risk assessment _____________   Date:  11/22/2022   Patient ID:  Jill Davidson, DOB 1970-09-20, MRN [id number] Patient Location:  Home Provider location:   Office  Primary Care Provider:  Steele Sizer, MD Primary Cardiologist:  None  Chief Complaint / Patient Profile   52 y.o. y/o female with a h/o  who is pending  and presents today for telephonic preoperative cardiovascular risk assessment.  History of Present Illness    Jill Davidson is a 52 y.o. female who presents via audio/video conferencing for a telehealth visit today.  Pt was last seen in cardiology clinic on  by .  At that time Jill Davidson was doing well from a cardiac perspective.  The patient is now pending procedure as outlined above. Since her last visit, she   Attempted to reach patient x 3 with no answer   Past Medical History    Past Medical History:  Diagnosis Date   AR (allergic rhinitis)    Bacterial vaginosis    Complication of anesthesia 2008   PT STATES THAT DURING C-SECTION SHE WAS ADMINISTERED A GAS THAT CAUSED HER HEART TO BEAT IRREGULAR- PT HAS HAD NO PROBLEMS WITH THIS SINCE 2008   Dysrhythmia    DUE TO A GAS THAT WAS ADMINISTERED DURING A 2008 C-SECTION-NO PROBLEMS SINCE   Fibroid    GERD (gastroesophageal reflux disease)    OCC   Hypertension     IDA (iron deficiency anemia) 01/25/2015   UTI (lower urinary tract infection)    Past Surgical History:  Procedure Laterality Date   CESAREAN SECTION     X2   DILATION AND CURETTAGE OF UTERUS     DILITATION & CURRETTAGE/HYSTROSCOPY WITH NOVASURE ABLATION N/A 07/23/2016   Procedure: DILATATION & CURETTAGE/HYSTEROSCOPY WITH NOVASURE ABLATION;  Surgeon: Brayton Mars, MD;  Location: [facility];  Service: Gynecology;  Laterality: N/A;   LAPAROSCOPIC VAGINAL HYSTERECTOMY WITH SALPINGECTOMY Bilateral 03/24/2018   Procedure: LAPAROSCOPIC ASSISTED VAGINAL HYSTERECTOMY WITH BILATERAL SALPINGECTOMY;  Surgeon: Brayton Mars, MD;  Location: [facility];  Service: Gynecology;  Laterality: Bilateral;    Allergies  Allergies  Allergen Reactions   Hepatitis B Virus Vaccines     Dysarthria     Home Medications    Prior to Admission medications   Medication Sig Start Date End Date Taking? Authorizing Provider  aspirin EC 81 MG tablet Take 81 mg by mouth daily. Swallow whole.    [provider]  losartan-hydrochlorothiazide (HYZAAR) 100-12.5 MG tablet Take 1 tablet by mouth daily. 06/07/22   End, Harrell Gave, MD    Physical Exam    Vital Signs:  Jill Davidson does not have vital signs available for review today.  Given telephonic nature of communication, physical exam is limited. AAOx3. NAD. Normal affect.  Speech and respirations are unlabored.  Accessory Clinical Findings    None  Assessment &  Plan    1.  Preoperative Cardiovascular Risk Assessment:  The patient was advised that if she develops new symptoms prior to surgery to contact our office to arrange for a follow-up visit, and she verbalized understanding.  (Reminder: Include SBE prophylaxis/Antiplatelet/Anticoag Instructions)  A copy of this note will be routed to requesting surgeon.  Time:   Today, I have spent  minutes with the patient with telehealth technology discussing medical history, symptoms, and  management plan.     Trudi Ida, NP  11/22/2022, 10:50 AMThis encounter was created in error - please disregard.

## 2022-11-23 ENCOUNTER — Encounter: Payer: Self-pay | Admitting: Gastroenterology

## 2022-11-26 ENCOUNTER — Ambulatory Visit
Admission: RE | Admit: 2022-11-26 | Discharge: 2022-11-26 | Disposition: A | Discharge status: Home / Self Care | Payer: 59 | Attending: Gastroenterology | Admitting: Gastroenterology

## 2022-11-26 ENCOUNTER — Ambulatory Visit: Payer: 59 | Admitting: Certified Registered"

## 2022-11-26 ENCOUNTER — Encounter
Admission: RE | Disposition: A | Discharge status: Home / Self Care | Payer: Self-pay | Source: Home / Self Care | Attending: Gastroenterology

## 2022-11-26 ENCOUNTER — Encounter: Payer: Self-pay | Admitting: Gastroenterology

## 2022-11-26 DIAGNOSIS — Z1211 Encounter for screening for malignant neoplasm of colon: Secondary | ICD-10-CM | POA: Diagnosis not present

## 2022-11-26 DIAGNOSIS — K621 Rectal polyp: Secondary | ICD-10-CM | POA: Diagnosis not present

## 2022-11-26 DIAGNOSIS — F17200 Nicotine dependence, unspecified, uncomplicated: Secondary | ICD-10-CM | POA: Diagnosis not present

## 2022-11-26 DIAGNOSIS — I10 Essential (primary) hypertension: Secondary | ICD-10-CM | POA: Insufficient documentation

## 2022-11-26 DIAGNOSIS — Z6841 Body Mass Index (BMI) 40.0 and over, adult: Secondary | ICD-10-CM | POA: Diagnosis not present

## 2022-11-26 DIAGNOSIS — Z79899 Other long term (current) drug therapy: Secondary | ICD-10-CM | POA: Diagnosis not present

## 2022-11-26 DIAGNOSIS — K635 Polyp of colon: Secondary | ICD-10-CM

## 2022-11-26 DIAGNOSIS — K219 Gastro-esophageal reflux disease without esophagitis: Secondary | ICD-10-CM | POA: Diagnosis not present

## 2022-11-26 DIAGNOSIS — E669 Obesity, unspecified: Secondary | ICD-10-CM | POA: Insufficient documentation

## 2022-11-26 DIAGNOSIS — D122 Benign neoplasm of ascending colon: Secondary | ICD-10-CM | POA: Diagnosis not present

## 2022-11-26 DIAGNOSIS — D126 Benign neoplasm of colon, unspecified: Secondary | ICD-10-CM | POA: Diagnosis not present

## 2022-11-26 DIAGNOSIS — J449 Chronic obstructive pulmonary disease, unspecified: Secondary | ICD-10-CM | POA: Insufficient documentation

## 2022-11-26 HISTORY — PX: COLONOSCOPY WITH PROPOFOL: SHX5780

## 2022-11-26 SURGERY — COLONOSCOPY WITH PROPOFOL
Anesthesia: General

## 2022-11-26 MED ORDER — SODIUM CHLORIDE 0.9 % IV SOLN: INTRAVENOUS | Status: DC | PRN

## 2022-11-26 MED ORDER — PROPOFOL 500 MG/50ML IV EMUL
INTRAVENOUS | Status: DC | PRN
  Administered 2022-11-26: 150 ug/kg/min via INTRAVENOUS

## 2022-11-26 MED ORDER — PROPOFOL 10 MG/ML IV BOLUS
INTRAVENOUS | Status: AC
  Filled 2022-11-26: qty 20

## 2022-11-26 MED ORDER — SODIUM CHLORIDE 0.9 % IV SOLN
INTRAVENOUS | Status: DC
  Administered 2022-11-26: 1000 mL via INTRAVENOUS

## 2022-11-26 NOTE — Op Note (Signed)
[facility] Gastroenterology Patient Name: Jill Davidson Procedure Date: 11/26/2022 10:58 AM MRN: [id number] Account #: [id number] Date of Birth: [birth year] Admit Type: Outpatient Age: 52 Room: [facility] ENDO ROOM 4 Gender: Female Note Status: Finalized Instrument Name: Jasper Riling [id number] Procedure:             Colonoscopy Indications:           Screening for colorectal malignant neoplasm, This is                         the patient's first colonoscopy Providers:             Lin Landsman MD, MD Referring MD:          Bethena Roys. Sowles, MD (Referring MD) Medicines:             General Anesthesia Complications:         No immediate complications. Estimated blood loss: None. Procedure:             Pre-Anesthesia Assessment:                        - Prior to the procedure, a History and Physical was                         performed, and patient medications and allergies were                         reviewed. The patient is competent. The risks and                         benefits of the procedure and the sedation options and                         risks were discussed with the patient. All questions                         were answered and informed consent was obtained.                         Patient identification and proposed procedure were                         verified by the physician, the nurse, the                         anesthesiologist, the anesthetist and the technician                         in the pre-procedure area in the procedure room in the                         endoscopy suite. Mental Status Examination: alert and                         oriented. Airway Examination: normal oropharyngeal                         airway and neck mobility. Respiratory Examination:  clear to auscultation. CV Examination: normal.                         Prophylactic Antibiotics: The patient does not require                          prophylactic antibiotics. Prior Anticoagulants: The                         patient has taken no anticoagulant or antiplatelet                         agents. ASA Grade Assessment: III - A patient with                         severe systemic disease. After reviewing the risks and                         benefits, the patient was deemed in satisfactory                         condition to undergo the procedure. The anesthesia                         plan was to use general anesthesia. Immediately prior                         to administration of medications, the patient was                         re-assessed for adequacy to receive sedatives. The                         heart rate, respiratory rate, oxygen saturations,                         blood pressure, adequacy of pulmonary ventilation, and                         response to care were monitored throughout the                         procedure. The physical status of the patient was                         re-assessed after the procedure.                        After obtaining informed consent, the colonoscope was                         passed under direct vision. Throughout the procedure,                         the patient's blood pressure, pulse, and oxygen                         saturations were monitored continuously. The  Colonoscope was introduced through the anus and                         advanced to the the cecum, identified by appendiceal                         orifice and ileocecal valve. The colonoscopy was                         performed without difficulty. The patient tolerated                         the procedure well. The quality of the bowel                         preparation was evaluated using the BBPS [facility] Bowel                         Preparation Scale) with scores of: Right Colon = 3,                         Transverse Colon = 3 and Left Colon = 3 (entire mucosa                          seen well with no residual staining, small fragments                         of stool or opaque liquid). The total BBPS score                         equals 9. The ileocecal valve, appendiceal orifice,                         and rectum were photographed. Findings:      The perianal and digital rectal examinations were normal. Pertinent       negatives include normal sphincter tone and no palpable rectal lesions.      A diminutive polyp was found in the ascending colon. The polyp was       sessile. The polyp was removed with a jumbo cold forceps. Resection and       retrieval were complete.      Four sessile polyps were found in the rectum. The polyps were 3 to 5 mm       in size. These polyps were removed with a cold snare. Resection and       retrieval were complete. Estimated blood loss: none.      The retroflexed view of the distal rectum and anal verge was normal and       showed no anal or rectal abnormalities. Impression:            - One diminutive polyp in the ascending colon, removed                         with a jumbo cold forceps. Resected and retrieved.                        - Four 3 to 5 mm polyps in the rectum, removed  with a                         cold snare. Resected and retrieved.                        - The distal rectum and anal verge are normal on                         retroflexion view. Recommendation:        - Discharge patient to home (with escort).                        - Resume previous diet today.                        - Continue present medications.                        - Await pathology results.                        - Repeat colonoscopy in 5-10 years for surveillance of                         multiple polyps. Procedure Code(s):     --- Professional ---                        [id number], Colonoscopy, flexible; with removal of                         tumor(s), polyp(s), or other lesion(s) by snare                         technique                         45380, 20, Colonoscopy, flexible; with biopsy, single                         or multiple Diagnosis Code(s):     --- Professional ---                        Z12.11, Encounter for screening for malignant neoplasm                         of colon                        D12.2, Benign neoplasm of ascending colon                        D12.8, Benign neoplasm of rectum CPT copyright 2022 American Medical Association. All rights reserved. The codes documented in this report are preliminary and upon coder review may  be revised to meet current compliance requirements. Dr. Ulyess Mort Lin Landsman MD, MD 11/26/2022 11:31:46 AM This report has been signed electronically. Number of Addenda: 0 Note Initiated On: 11/26/2022 10:58 AM Scope Withdrawal Time: 0 hours 12 minutes 22 seconds  Total Procedure Duration: 0 hours 16 minutes 44 seconds  Estimated Blood Loss:  Estimated  blood loss: none.      [facility]

## 2022-11-26 NOTE — H&P (Signed)
Cephas Darby, MD [address]  [place]  [place], [place] [address]  Main: [phone]  Fax: [phone] Pager: [phone]  Primary Care Physician:  Steele Sizer, MD Primary Gastroenterologist:  Dr. Cephas Darby  Pre-Procedure History & Physical: HPI:  Jill Davidson is a 52 y.o. female is here for an colonoscopy.   Past Medical History:  Diagnosis Date   AR (allergic rhinitis)    Bacterial vaginosis    Complication of anesthesia 2008   PT STATES THAT DURING C-SECTION SHE WAS ADMINISTERED A GAS THAT CAUSED HER HEART TO BEAT IRREGULAR- PT HAS HAD NO PROBLEMS WITH THIS SINCE 2008   Dysrhythmia    DUE TO A GAS THAT WAS ADMINISTERED DURING A 2008 C-SECTION-NO PROBLEMS SINCE   Fibroid    GERD (gastroesophageal reflux disease)    OCC   Hypertension    IDA (iron deficiency anemia) 01/25/2015   UTI (lower urinary tract infection)     Past Surgical History:  Procedure Laterality Date   ABDOMINAL HYSTERECTOMY     CESAREAN SECTION     X2   DILATION AND CURETTAGE OF UTERUS     DILITATION & CURRETTAGE/HYSTROSCOPY WITH NOVASURE ABLATION N/A 07/23/2016   Procedure: DILATATION & CURETTAGE/HYSTEROSCOPY WITH NOVASURE ABLATION;  Surgeon: Brayton Mars, MD;  Location: [facility] ORS;  Service: Gynecology;  Laterality: N/A;   LAPAROSCOPIC VAGINAL HYSTERECTOMY WITH SALPINGECTOMY Bilateral 03/24/2018   Procedure: LAPAROSCOPIC ASSISTED VAGINAL HYSTERECTOMY WITH BILATERAL SALPINGECTOMY;  Surgeon: Brayton Mars, MD;  Location: [facility] ORS;  Service: Gynecology;  Laterality: Bilateral;    Prior to Admission medications   Medication Sig Start Date End Date Taking? Authorizing Provider  aspirin EC 81 MG tablet Take 81 mg by mouth daily. Swallow whole.   Yes [provider]  losartan-hydrochlorothiazide (HYZAAR) 100-12.5 MG tablet Take 1 tablet by mouth daily. 06/07/22  Yes End, Harrell Gave, MD    Allergies as of 11/08/2022 - Review Complete 11/07/2022  Allergen  Reaction Noted   Hepatitis b virus vaccines  02/05/2022    Family History  Problem Relation Age of Onset   Heart failure Mother    Hypertension Mother    Thyroid disease Mother    Lung cancer Mother    Asthma Mother    Peripheral Artery Disease Mother        stents in legs   Diabetes Father    Hypertension Father    Hypertension Brother    Heart failure Maternal Aunt    Breast cancer Maternal Aunt        great   Crohn's disease Maternal Aunt    Diabetes Maternal Grandmother    Cerebral aneurysm Maternal Grandmother    Ovarian cancer Neg Hx     Social History   Socioeconomic History   Marital status: Single    Spouse name: Not on file   Number of children: Not on file   Years of education: Not on file   Highest education level: Not on file  Occupational History   Not on file  Tobacco Use   Smoking status: Every Day    Packs/day: 0.50    Years: 25.00    Total pack years: 12.50    Types: Cigarettes    Start date: 07/03/1992   Smokeless tobacco: Never  Vaping Use   Vaping Use: Never used  Substance and Sexual Activity   Alcohol use: Yes    Alcohol/week: 2.0 standard drinks of alcohol    Types: 2 Cans of beer per week  Comment: occasional 1 drink every few weeks   Drug use: Not Currently    Types: Marijuana    Comment: None in over a year   Sexual activity: Yes    Birth control/protection: None    Comment: Ablation  Other Topics Concern   Not on file  Social History Narrative   She is a Emergency planning/management officer, 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, 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    Mother moved in with her   Social Determinants of Health   Financial Resource Strain: Medium Risk (05/09/2022)   Overall Financial Resource Strain (CARDIA)    Difficulty of Paying Living Expenses: Somewhat hard  Food Insecurity: No Food Insecurity (05/09/2022)   Hunger Vital Sign    Worried About Running Out of Food in the Last Year: Never true    Secretary in the  Last Year: Never true  Transportation Needs: No Transportation Needs (05/09/2022)   PRAPARE - Hydrologist (Medical): No    Lack of Transportation (Non-Medical): No  Physical Activity: Sufficiently Active (05/09/2022)   Exercise Vital Sign    Days of Exercise per Week: 5 days    Minutes of Exercise per Session: 40 min  Recent Concern: Physical Activity - Insufficiently Active (04/10/2022)   Exercise Vital Sign    Days of Exercise per Week: 3 days    Minutes of Exercise per Session: 20 min  Stress: No Stress Concern Present (05/09/2022)   [place]    Feeling of Stress : Only a little  Social Connections: Moderately Isolated (05/09/2022)   Social Connection and Isolation Panel [NHANES]    Frequency of Communication with Friends and Family: More than three times a week    Frequency of Social Gatherings with Friends and Family: Twice a week    Attends Religious Services: More than 4 times per year    Active Member of [employer] or Organizations: No    Attends Archivist Meetings: Never    Marital Status: Never married  Intimate Partner Violence: Not At Risk (05/09/2022)   Humiliation, Afraid, Rape, and Kick questionnaire    Fear of Current or Ex-Partner: No    Emotionally Abused: No    Physically Abused: No    Sexually Abused: No    Review of Systems: See HPI, otherwise negative ROS  Physical Exam: BP (!) 148/104   Pulse 77   Temp (!) 96.8 F (36 C) (Temporal)   Resp 18   Ht '5\' 3"'$  (1.6 m)   Wt 104.3 kg   LMP 03/21/2018 (Exact Date)   SpO2 98%   BMI 40.74 kg/m  General:   Alert,  pleasant and cooperative in NAD Head:  Normocephalic and atraumatic. Neck:  Supple; no masses or thyromegaly. Lungs:  Clear throughout to auscultation.    Heart:  Regular rate and rhythm. Abdomen:  Soft, nontender and nondistended. Normal bowel sounds, without guarding, and without rebound.    Neurologic:  Alert and  oriented x4;  grossly normal neurologically.  Impression/Plan: Jill Davidson is here for an colonoscopy to be performed for colon cancer screening  Risks, benefits, limitations, and alternatives regarding  colonoscopy have been reviewed with the patient.  Questions have been answered.  All parties agreeable.   Sherri Sear, MD  11/26/2022, 10:58 AM

## 2022-11-26 NOTE — Anesthesia Preprocedure Evaluation (Signed)
Anesthesia Evaluation  Patient identified by MRN, date of birth, ID band Patient awake    Reviewed: Allergy & Precautions, NPO status , Patient's Chart, lab work & pertinent test results  History of Anesthesia Complications Negative for: history of anesthetic complications  Airway Mallampati: III  TM Distance: >3 FB Neck ROM: Full    Dental  (+) Teeth Intact   Pulmonary neg pulmonary ROS, pneumonia, COPD, Current Smoker and Patient abstained from smoking.   Pulmonary exam normal  + decreased breath sounds      Cardiovascular Exercise Tolerance: Good hypertension, Pt. on medications negative cardio ROS Normal cardiovascular exam+ dysrhythmias  Rhythm:Regular     Neuro/Psych negative neurological ROS  negative psych ROS   GI/Hepatic negative GI ROS, Neg liver ROS,GERD  Medicated,,  Endo/Other  negative endocrine ROS    Renal/GU negative Renal ROS  negative genitourinary   Musculoskeletal   Abdominal  (+) + obese  Peds negative pediatric ROS (+)  Hematology negative hematology ROS (+) Blood dyscrasia   Anesthesia Other Findings Past Medical History: No date: AR (allergic rhinitis) No date: Bacterial vaginosis [id number]: Complication of anesthesia     Comment:  PT STATES THAT DURING C-SECTION SHE WAS ADMINISTERED A               GAS THAT CAUSED HER HEART TO BEAT IRREGULAR- PT HAS HAD               NO PROBLEMS WITH THIS SINCE 2008 No date: Dysrhythmia     Comment:  DUE TO A GAS THAT WAS ADMINISTERED DURING A 2008               C-SECTION-NO PROBLEMS SINCE No date: Fibroid No date: GERD (gastroesophageal reflux disease)     Comment:  OCC No date: Hypertension 01/25/2015: IDA (iron deficiency anemia) No date: UTI (lower urinary tract infection)  Past Surgical History: No date: ABDOMINAL HYSTERECTOMY No date: CESAREAN SECTION     Comment:  X2 No date: DILATION AND CURETTAGE OF UTERUS 07/23/2016: DILITATION &  CURRETTAGE/HYSTROSCOPY WITH NOVASURE  ABLATION; N/A     Comment:  Procedure: DILATATION & CURETTAGE/HYSTEROSCOPY WITH               NOVASURE ABLATION;  Surgeon: Brayton Mars, MD;                Location: [facility] ORS;  Service: Gynecology;  Laterality:               N/A; 03/24/2018: LAPAROSCOPIC VAGINAL HYSTERECTOMY WITH SALPINGECTOMY;  Bilateral     Comment:  Procedure: LAPAROSCOPIC ASSISTED VAGINAL HYSTERECTOMY               WITH BILATERAL SALPINGECTOMY;  Surgeon: Brayton Mars, MD;  Location: [facility] ORS;  Service: Gynecology;               Laterality: Bilateral;  BMI    Body Mass Index: 40.74 kg/m      Reproductive/Obstetrics negative OB ROS                             Anesthesia Physical Anesthesia Plan  ASA: 3  Anesthesia Plan: General   Post-op Pain Management:    Induction:   PONV Risk Score and Plan: Propofol infusion and TIVA  Airway Management Planned: Natural Airway  Additional Equipment:   Intra-op Plan:  Post-operative Plan:   Informed Consent: I have reviewed the patients History and Physical, chart, labs and discussed the procedure including the risks, benefits and alternatives for the proposed anesthesia with the patient or authorized representative who has indicated his/her understanding and acceptance.     Dental Advisory Given  Plan Discussed with: CRNA and Surgeon  Anesthesia Plan Comments:        Anesthesia Quick Evaluation

## 2022-11-26 NOTE — Transfer of Care (Signed)
Immediate Anesthesia Transfer of Care Note  Patient: Jill Davidson  Procedure(s) Performed: COLONOSCOPY WITH PROPOFOL  Patient Location: PACU  Anesthesia Type:MAC  Level of Consciousness: drowsy  Airway & Oxygen Therapy: Patient Spontanous Breathing  Post-op Assessment: Report given to RN  Post vital signs: Reviewed  Last Vitals:  Vitals Value Taken Time  BP 117/71 11/26/22 1136  Temp 36.1 C 11/26/22 1134  Pulse 65 11/26/22 1136  Resp 13 11/26/22 1136  SpO2 100 % 11/26/22 1136  Vitals shown include unvalidated device data.  Last Pain:  Vitals:   11/26/22 1134  TempSrc: Temporal  PainSc: 0-No pain         Complications: No notable events documented.

## 2022-11-26 NOTE — Anesthesia Postprocedure Evaluation (Signed)
Anesthesia Post Note  Patient: Jill Davidson  Procedure(s) Performed: COLONOSCOPY WITH PROPOFOL  Patient location during evaluation: PACU Anesthesia Type: General Level of consciousness: awake and oriented Pain management: pain level controlled Vital Signs Assessment: post-procedure vital signs reviewed and stable Respiratory status: spontaneous breathing and respiratory function stable Cardiovascular status: stable Anesthetic complications: no   No notable events documented.   Last Vitals:  Vitals:   11/26/22 1134 11/26/22 1148  BP: 117/71 123/88  Pulse: 70 (!) 58  Resp: 17 15  Temp: (!) 36.1 C   SpO2: 99% 100%    Last Pain:  Vitals:   11/26/22 1148  TempSrc:   PainSc: 0-No pain                 VAN STAVEREN,Shelbie Franken

## 2022-11-27 ENCOUNTER — Encounter: Payer: Self-pay | Admitting: Gastroenterology

## 2022-11-27 ENCOUNTER — Encounter: Payer: Self-pay | Admitting: Hematology and Oncology

## 2022-11-27 ENCOUNTER — Ambulatory Visit
Admission: RE | Admit: 2022-11-27 | Discharge: 2022-11-27 | Disposition: A | Discharge status: Home / Self Care | Payer: Medicaid Other | Source: Ambulatory Visit | Attending: Nurse Practitioner | Admitting: Nurse Practitioner

## 2022-11-27 DIAGNOSIS — R918 Other nonspecific abnormal finding of lung field: Secondary | ICD-10-CM | POA: Diagnosis not present

## 2022-11-27 DIAGNOSIS — I77810 Thoracic aortic ectasia: Secondary | ICD-10-CM | POA: Diagnosis not present

## 2022-11-27 DIAGNOSIS — I7121 Aneurysm of the ascending aorta, without rupture: Secondary | ICD-10-CM

## 2022-11-27 LAB — SURGICAL PATHOLOGY

## 2022-11-27 MED ORDER — IOPAMIDOL (ISOVUE-370) INJECTION 76%
75.0000 mL | Freq: Once | INTRAVENOUS | Status: AC | PRN
  Administered 2022-11-27: 75 mL via INTRAVENOUS

## 2022-11-28 ENCOUNTER — Encounter: Payer: Self-pay | Admitting: Gastroenterology

## 2022-12-03 ENCOUNTER — Ambulatory Visit: Payer: 59 | Admitting: Surgery

## 2022-12-11 ENCOUNTER — Telehealth (INDEPENDENT_AMBULATORY_CARE_PROVIDER_SITE_OTHER): Payer: Self-pay | Admitting: Vascular Surgery

## 2022-12-11 NOTE — Telephone Encounter (Signed)
LVM for pt to make sure that she has had her CT BEFORE the appt on 3.28.24. There is no reason for the appt on Thursday unless she has had the CT.

## 2022-12-12 NOTE — Progress Notes (Deleted)
MRN : [id number]  Jill Davidson is a 52 y.o. (10-Dec-1970) female who presents with chief complaint of check circulation.  History of Present Illness:   The patient presents to the office for evaluation of an ascending thoracic aortic aneurysm. The aneurysm was found incidentally by CT scan. Patient denies chest pain or unusual back pain, no other chest or abdominal complaints.  No history of an abrupt onset of a painful toe associated with blue discoloration.     No family history of TAA/AAA.   Patient denies amaurosis fugax or TIA symptoms.  There is no history of claudication or rest pain symptoms of the lower extremities.   The patient denies angina or shortness of breath.  CT scan dated 11/27/2022, shows an ascending TAA that measures 3.7 cm   No outpatient medications have been marked as taking for the 12/13/22 encounter (Appointment) with Delana Meyer, Dolores Lory, MD.    Past Medical History:  Diagnosis Date   AR (allergic rhinitis)    Bacterial vaginosis    Complication of anesthesia 2008   PT STATES THAT DURING C-SECTION SHE WAS ADMINISTERED A GAS THAT CAUSED HER HEART TO BEAT IRREGULAR- PT HAS HAD NO PROBLEMS WITH THIS SINCE 2008   Dysrhythmia    DUE TO A GAS THAT WAS ADMINISTERED DURING A 2008 C-SECTION-NO PROBLEMS SINCE   Fibroid    GERD (gastroesophageal reflux disease)    OCC   Hypertension    IDA (iron deficiency anemia) 01/25/2015   UTI (lower urinary tract infection)     Past Surgical History:  Procedure Laterality Date   ABDOMINAL HYSTERECTOMY     CESAREAN SECTION     X2   COLONOSCOPY WITH PROPOFOL N/A 11/26/2022   Procedure: COLONOSCOPY WITH PROPOFOL;  Surgeon: Lin Landsman, MD;  Location: [place];  Service: Gastroenterology;  Laterality: N/A;   DILATION AND CURETTAGE OF UTERUS     DILITATION & CURRETTAGE/HYSTROSCOPY WITH NOVASURE ABLATION N/A 07/23/2016   Procedure: DILATATION & CURETTAGE/HYSTEROSCOPY WITH NOVASURE ABLATION;   Surgeon: Brayton Mars, MD;  Location: [facility];  Service: Gynecology;  Laterality: N/A;   LAPAROSCOPIC VAGINAL HYSTERECTOMY WITH SALPINGECTOMY Bilateral 03/24/2018   Procedure: LAPAROSCOPIC ASSISTED VAGINAL HYSTERECTOMY WITH BILATERAL SALPINGECTOMY;  Surgeon: Brayton Mars, MD;  Location: [facility];  Service: Gynecology;  Laterality: Bilateral;    Social History Social History   Tobacco Use   Smoking status: Every Day    Packs/day: 0.50    Years: 25.00    Additional pack years: 0.00    Total pack years: 12.50    Types: Cigarettes    Start date: 07/03/1992   Smokeless tobacco: Never  Vaping Use   Vaping Use: Never used  Substance Use Topics   Alcohol use: Yes    Alcohol/week: 2.0 standard drinks of alcohol    Types: 2 Cans of beer per week    Comment: occasional 1 drink every few weeks   Drug use: Not Currently    Types: Marijuana    Comment: None in over a year    Family History Family History  Problem Relation Age of Onset   Heart failure Mother    Hypertension Mother    Thyroid disease Mother    Lung cancer Mother    Asthma Mother    Peripheral Artery Disease Mother        stents in legs   Diabetes Father    Hypertension Father  Hypertension Brother    Heart failure Maternal Aunt    Breast cancer Maternal Aunt        great   Crohn's disease Maternal Aunt    Diabetes Maternal Grandmother    Cerebral aneurysm Maternal Grandmother    Ovarian cancer Neg Hx     Allergies  Allergen Reactions   Hepatitis B Virus Vaccines     Dysarthria      REVIEW OF SYSTEMS (Negative unless checked)  Constitutional: [] Weight loss  [] Fever  [] Chills Cardiac: [] Chest pain   [] Chest pressure   [] Palpitations   [] Shortness of breath when laying flat   [] Shortness of breath with exertion. Vascular:  [x] Pain in legs with walking   [] Pain in legs at rest  [] History of DVT   [] Phlebitis   [] Swelling in legs   [] Varicose veins   [] Non-healing ulcers Pulmonary:    [] Uses home oxygen   [] Productive cough   [] Hemoptysis   [] Wheeze  [] COPD   [] Asthma Neurologic:  [] Dizziness   [] Seizures   [] History of stroke   [] History of TIA  [] Aphasia   [] Vissual changes   [] Weakness or numbness in arm   [] Weakness or numbness in leg Musculoskeletal:   [] Joint swelling   [] Joint pain   [] Low back pain Hematologic:  [] Easy bruising  [] Easy bleeding   [] Hypercoagulable state   [] Anemic Gastrointestinal:  [] Diarrhea   [] Vomiting  [] Gastroesophageal reflux/heartburn   [] Difficulty swallowing. Genitourinary:  [] Chronic kidney disease   [] Difficult urination  [] Frequent urination   [] Blood in urine Skin:  [] Rashes   [] Ulcers  Psychological:  [] History of anxiety   []  History of major depression.  Physical Examination  There were no vitals filed for this visit. There is no height or weight on file to calculate BMI. Gen: WD/WN, NAD Head: [place]/AT, No temporalis wasting.  Ear/Nose/Throat: Hearing grossly intact, nares w/o erythema or drainage Eyes: PER, EOMI, sclera nonicteric.  Neck: Supple, no masses.  No bruit or JVD.  Pulmonary:  Good air movement, no audible wheezing, no use of accessory muscles.  Cardiac: RRR, normal S1, S2, no Murmurs. Vascular:  mild trophic changes, no open wounds Vessel Right Left  Radial Palpable Palpable  PT Not Palpable Not Palpable  DP Not Palpable Not Palpable  Gastrointestinal: soft, non-distended. No guarding/no peritoneal signs.  Musculoskeletal: M/S 5/5 throughout.  No visible deformity.  Neurologic: CN 2-12 intact. Pain and light touch intact in extremities.  Symmetrical.  Speech is fluent. Motor exam as listed above. Psychiatric: Judgment intact, Mood & affect appropriate for pt's clinical situation. Dermatologic: No rashes or ulcers noted.  No changes consistent with cellulitis.   CBC Lab Results  Component Value Date   WBC 12.9 (H) 03/23/2022   HGB 14.1 03/23/2022   HCT 40.7 03/23/2022   MCV 90.0 03/23/2022   PLT 317  03/23/2022    BMET    Component Value Date/Time   NA 137 09/20/2022 1705   NA 137 08/02/2017 1206   K 3.4 (L) 09/20/2022 1705   K 3.6 08/02/2017 1206   CL 101 09/20/2022 1705   CO2 25 09/20/2022 1705   CO2 23 08/02/2017 1206   GLUCOSE 86 09/20/2022 1705   GLUCOSE 86 08/02/2017 1206   BUN 12 09/20/2022 1705   BUN 8.8 08/02/2017 1206   CREATININE 0.63 09/20/2022 1705   CREATININE 0.65 03/12/2022 1443   CREATININE 0.7 08/02/2017 1206   CALCIUM 9.3 09/20/2022 1705   CALCIUM 8.9 08/02/2017 1206   GFRNONAA >60 09/20/2022 1705  GFRNONAA 105 09/05/2020 1530   GFRAA 121 09/05/2020 1530   CrCl cannot be calculated (Patient's most recent lab result is older than the maximum 21 days allowed.).  COAG Lab Results  Component Value Date   INR 1.2 01/28/2022    Radiology CT ANGIO CHEST AORTA W/CM & OR WO/CM  Result Date: 11/27/2022 CLINICAL DATA:  Aortic aneurysm suspected EXAM: CT ANGIOGRAPHY CHEST WITH CONTRAST TECHNIQUE: Multidetector CT imaging of the chest was performed using the standard protocol during bolus administration of intravenous contrast. Multiplanar CT image reconstructions and MIPs were obtained to evaluate the vascular anatomy. RADIATION DOSE REDUCTION: This exam was performed according to the departmental dose-optimization program which includes automated exposure control, adjustment of the mA and/or kV according to patient size and/or use of iterative reconstruction technique. CONTRAST:  90mL ISOVUE-370 IOPAMIDOL (ISOVUE-370) INJECTION 76% COMPARISON:  CTA chest, 10/10/2022. FINDINGS: CARDIOVASCULAR: Limitations by motion: Moderate Preferential opacification of the thoracic aorta. No evidence of thoracic aortic dissection. Aortic Root: --Valve: 2.4 cm --Sinuses: 3.0 cm --Sinotubular Junction: 2.8 cm Thoracic Aorta: --Ascending Aorta: 3.7 cm --Aortic Arch: 2.9 cm --Descending Aorta: 2.6 cm Other: Conventional 3-sided LEFT aortic arch. No atherosclerosis or hemodynamic  stenosis at the origins of the great vessels. Normal heart size. No pericardial effusion. Dilated main PA, measuring 3.3 cm. No segmental or larger pulmonary embolus. Mediastinum/Nodes: Non-masslike, triangular-shaped soft tissue thickening at the anterior mediastinum greater than expected for patient's age. No calcifications. No enlarged mediastinal, hilar, or axillary lymph nodes. 1.5 cm RIGHT thyroid nodule. Trachea, and esophagus demonstrate no significant findings. Lungs/Pleura: Stable subpleural nodules, at the LEFT medial apex measuring 0.3 cm and anterolateral RIGHT lung base measuring 0.8 cm. Small (0.5 cm) perifissural nodularity along the RIGHT major fissure is likely an intrapulmonary lymph node. Lungs are otherwise clear without focal consolidation or mass. No pleural effusion or pneumothorax. Upper Abdomen: No acute abnormality. Musculoskeletal: No acute chest wall abnormality. No significant osseous findings. Review of the MIP images confirms the above findings. IMPRESSION: 1. Nonaneurysmal thoracic aorta, measuring 3.7 cm at the ascending segment. No acute intrathoracic vascular abnormality. 2. Non-masslike soft tissue thickening at the anterior mediastinum, greater than expected for the patient's age. Differential diagnosis includes thymic hyperplasia, however neoplasm can not be excluded. 3. Stable 8 mm anterolateral RIGHT basilar subpleural nodule. Additional incidental, chronic and senescent findings as above. 4. 1.5 cm incidental RIGHT thyroid nodule. Recommend non-emergent thyroid ultrasound, if not previously performed. Reference: J Am Coll Radiol. 2015 Feb;12(2): 143-50 Michaelle Birks, MD [facility] Electronically Signed   By: Michaelle Birks M.D.   On: 11/27/2022 16:06     Assessment/Plan There are no diagnoses linked to this encounter.   Hortencia Pilar, MD  12/12/2022 1:19 PM

## 2022-12-13 ENCOUNTER — Ambulatory Visit (INDEPENDENT_AMBULATORY_CARE_PROVIDER_SITE_OTHER): Payer: Medicaid Other | Admitting: Vascular Surgery

## 2022-12-13 DIAGNOSIS — I7121 Aneurysm of the ascending aorta, without rupture: Secondary | ICD-10-CM

## 2022-12-17 ENCOUNTER — Ambulatory Visit: Payer: 59 | Admitting: Surgery

## 2022-12-24 ENCOUNTER — Ambulatory Visit: Payer: 59 | Admitting: Surgery

## 2022-12-31 ENCOUNTER — Ambulatory Visit: Payer: 59 | Admitting: Surgery

## 2023-01-02 NOTE — Progress Notes (Deleted)
MRN : [id number]  Jill Davidson is a 52 y.o. (Oct 11, 1970) female who presents with chief complaint of check circulation.  History of Present Illness:   The patient presents to the office for evaluation of an ascending thoracic aortic aneurysm. The aneurysm was found incidentally by CT scan. Patient denies chest pain or unusual back pain, no other chest or abdominal complaints.  No history of an abrupt onset of a painful toe associated with blue discoloration.     No family history of TAA/AAA.   Patient denies amaurosis fugax or TIA symptoms.  There is no history of claudication or rest pain symptoms of the lower extremities.   The patient denies angina or shortness of breath.  CT scan dated 11/27/2022, shows an ascending TAA that measures 3.7 cm   No outpatient medications have been marked as taking for the 01/03/23 encounter (Appointment) with Gilda Crease, Latina Craver, MD.    Past Medical History:  Diagnosis Date   AR (allergic rhinitis)    Bacterial vaginosis    Complication of anesthesia 2008   PT STATES THAT DURING C-SECTION SHE WAS ADMINISTERED A GAS THAT CAUSED HER HEART TO BEAT IRREGULAR- PT HAS HAD NO PROBLEMS WITH THIS SINCE 2008   Dysrhythmia    DUE TO A GAS THAT WAS ADMINISTERED DURING A 2008 C-SECTION-NO PROBLEMS SINCE   Fibroid    GERD (gastroesophageal reflux disease)    OCC   Hypertension    IDA (iron deficiency anemia) 01/25/2015   UTI (lower urinary tract infection)     Past Surgical History:  Procedure Laterality Date   ABDOMINAL HYSTERECTOMY     CESAREAN SECTION     X2   COLONOSCOPY WITH PROPOFOL N/A 11/26/2022   Procedure: COLONOSCOPY WITH PROPOFOL;  Surgeon: Toney Reil, MD;  Location: [facility];  Service: Gastroenterology;  Laterality: N/A;   DILATION AND CURETTAGE OF UTERUS     DILITATION & CURRETTAGE/HYSTROSCOPY WITH NOVASURE ABLATION N/A 07/23/2016   Procedure: DILATATION & CURETTAGE/HYSTEROSCOPY WITH NOVASURE ABLATION;   Surgeon: Herold Harms, MD;  Location: [facility];  Service: Gynecology;  Laterality: N/A;   LAPAROSCOPIC VAGINAL HYSTERECTOMY WITH SALPINGECTOMY Bilateral 03/24/2018   Procedure: LAPAROSCOPIC ASSISTED VAGINAL HYSTERECTOMY WITH BILATERAL SALPINGECTOMY;  Surgeon: Herold Harms, MD;  Location: [facility];  Service: Gynecology;  Laterality: Bilateral;    Social History Social History   Tobacco Use   Smoking status: Every Day    Packs/day: 0.50    Years: 25.00    Additional pack years: 0.00    Total pack years: 12.50    Types: Cigarettes    Start date: 07/03/1992   Smokeless tobacco: Never  Vaping Use   Vaping Use: Never used  Substance Use Topics   Alcohol use: Yes    Alcohol/week: 2.0 standard drinks of alcohol    Types: 2 Cans of beer per week    Comment: occasional 1 drink every few weeks   Drug use: Not Currently    Types: Marijuana    Comment: None in over a year    Family History Family History  Problem Relation Age of Onset   Heart failure Mother    Hypertension Mother    Thyroid disease Mother    Lung cancer Mother    Asthma Mother    Peripheral Artery Disease Mother        stents in legs   Diabetes Father    Hypertension Father  Hypertension Brother    Heart failure Maternal Aunt    Breast cancer Maternal Aunt        great   Crohn's disease Maternal Aunt    Diabetes Maternal Grandmother    Cerebral aneurysm Maternal Grandmother    Ovarian cancer Neg Hx     Allergies  Allergen Reactions   Hepatitis B Virus Vaccines     Dysarthria      REVIEW OF SYSTEMS (Negative unless checked)  Constitutional: [] Weight loss  [] Fever  [] Chills Cardiac: [] Chest pain   [] Chest pressure   [] Palpitations   [] Shortness of breath when laying flat   [] Shortness of breath with exertion. Vascular:  [x] Pain in legs with walking   [] Pain in legs at rest  [] History of DVT   [] Phlebitis   [] Swelling in legs   [] Varicose veins   [] Non-healing ulcers Pulmonary:    [] Uses home oxygen   [] Productive cough   [] Hemoptysis   [] Wheeze  [] COPD   [] Asthma Neurologic:  [] Dizziness   [] Seizures   [] History of stroke   [] History of TIA  [] Aphasia   [] Vissual changes   [] Weakness or numbness in arm   [] Weakness or numbness in leg Musculoskeletal:   [] Joint swelling   [] Joint pain   [] Low back pain Hematologic:  [] Easy bruising  [] Easy bleeding   [] Hypercoagulable state   [] Anemic Gastrointestinal:  [] Diarrhea   [] Vomiting  [] Gastroesophageal reflux/heartburn   [] Difficulty swallowing. Genitourinary:  [] Chronic kidney disease   [] Difficult urination  [] Frequent urination   [] Blood in urine Skin:  [] Rashes   [] Ulcers  Psychological:  [] History of anxiety   []  History of major depression.  Physical Examination  There were no vitals filed for this visit. There is no height or weight on file to calculate BMI. Gen: WD/WN, NAD Head: [place]/AT, No temporalis wasting.  Ear/Nose/Throat: Hearing grossly intact, nares w/o erythema or drainage Eyes: PER, EOMI, sclera nonicteric.  Neck: Supple, no masses.  No bruit or JVD.  Pulmonary:  Good air movement, no audible wheezing, no use of accessory muscles.  Cardiac: RRR, normal S1, S2, no Murmurs. Vascular:  mild trophic changes, no open wounds Vessel Right Left  Radial Palpable Palpable  PT Not Palpable Not Palpable  DP Not Palpable Not Palpable  Gastrointestinal: soft, non-distended. No guarding/no peritoneal signs.  Musculoskeletal: M/S 5/5 throughout.  No visible deformity.  Neurologic: CN 2-12 intact. Pain and light touch intact in extremities.  Symmetrical.  Speech is fluent. Motor exam as listed above. Psychiatric: Judgment intact, Mood & affect appropriate for pt's clinical situation. Dermatologic: No rashes or ulcers noted.  No changes consistent with cellulitis.   CBC Lab Results  Component Value Date   WBC 12.9 (H) 03/23/2022   HGB 14.1 03/23/2022   HCT 40.7 03/23/2022   MCV 90.0 03/23/2022   PLT 317  03/23/2022    BMET    Component Value Date/Time   NA 137 09/20/2022 1705   NA 137 08/02/2017 1206   K 3.4 (L) 09/20/2022 1705   K 3.6 08/02/2017 1206   CL 101 09/20/2022 1705   CO2 25 09/20/2022 1705   CO2 23 08/02/2017 1206   GLUCOSE 86 09/20/2022 1705   GLUCOSE 86 08/02/2017 1206   BUN 12 09/20/2022 1705   BUN 8.8 08/02/2017 1206   CREATININE 0.63 09/20/2022 1705   CREATININE 0.65 03/12/2022 1443   CREATININE 0.7 08/02/2017 1206   CALCIUM 9.3 09/20/2022 1705   CALCIUM 8.9 08/02/2017 1206   GFRNONAA >60 09/20/2022 1705  GFRNONAA 105 09/05/2020 1530   GFRAA 121 09/05/2020 1530   CrCl cannot be calculated (Patient's most recent lab result is older than the maximum 21 days allowed.).  COAG Lab Results  Component Value Date   INR 1.2 01/28/2022    Radiology No results found.   Assessment/Plan There are no diagnoses linked to this encounter.   Levora Dredge, MD  01/02/2023 6:20 PM

## 2023-01-03 ENCOUNTER — Ambulatory Visit (INDEPENDENT_AMBULATORY_CARE_PROVIDER_SITE_OTHER): Payer: Medicaid Other | Admitting: Vascular Surgery

## 2023-01-03 DIAGNOSIS — I7121 Aneurysm of the ascending aorta, without rupture: Secondary | ICD-10-CM

## 2023-01-03 DIAGNOSIS — I10 Essential (primary) hypertension: Secondary | ICD-10-CM

## 2023-01-10 ENCOUNTER — Ambulatory Visit (INDEPENDENT_AMBULATORY_CARE_PROVIDER_SITE_OTHER): Payer: Medicaid Other | Admitting: Vascular Surgery

## 2023-01-13 NOTE — Progress Notes (Signed)
MRN : [id number]  Jill Davidson is a 52 y.o. (26-Dec-1970) female who presents with chief complaint of check circulation.  History of Present Illness:   The patient presents to the office for evaluation of an ascending thoracic aortic aneurysm. The aneurysm was found incidentally by CT scan. Patient denies chest pain or unusual back pain, no other chest or abdominal complaints.  No history of an abrupt onset of a painful toe associated with blue discoloration.     No family history of TAA/AAA.   Patient denies amaurosis fugax or TIA symptoms.  There is no history of claudication or rest pain symptoms of the lower extremities.   The patient denies angina or shortness of breath.  CT scan dated 11/27/2022, shows an ascending TAA that measures 4.5 cm.  Past CT reported as 4.8 cm.   No outpatient medications have been marked as taking for the 01/14/23 encounter (Appointment) with Gilda Crease, Latina Craver, MD.    Past Medical History:  Diagnosis Date   AR (allergic rhinitis)    Bacterial vaginosis    Complication of anesthesia 2008   PT STATES THAT DURING C-SECTION SHE WAS ADMINISTERED A GAS THAT CAUSED HER HEART TO BEAT IRREGULAR- PT HAS HAD NO PROBLEMS WITH THIS SINCE 2008   Dysrhythmia    DUE TO A GAS THAT WAS ADMINISTERED DURING A 2008 C-SECTION-NO PROBLEMS SINCE   Fibroid    GERD (gastroesophageal reflux disease)    OCC   Hypertension    IDA (iron deficiency anemia) 01/25/2015   UTI (lower urinary tract infection)     Past Surgical History:  Procedure Laterality Date   ABDOMINAL HYSTERECTOMY     CESAREAN SECTION     X2   COLONOSCOPY WITH PROPOFOL N/A 11/26/2022   Procedure: COLONOSCOPY WITH PROPOFOL;  Surgeon: Toney Reil, MD;  Location: [facility];  Service: Gastroenterology;  Laterality: N/A;   DILATION AND CURETTAGE OF UTERUS     DILITATION & CURRETTAGE/HYSTROSCOPY WITH NOVASURE ABLATION N/A 07/23/2016   Procedure: DILATATION &  CURETTAGE/HYSTEROSCOPY WITH NOVASURE ABLATION;  Surgeon: Herold Harms, MD;  Location: [facility];  Service: Gynecology;  Laterality: N/A;   LAPAROSCOPIC VAGINAL HYSTERECTOMY WITH SALPINGECTOMY Bilateral 03/24/2018   Procedure: LAPAROSCOPIC ASSISTED VAGINAL HYSTERECTOMY WITH BILATERAL SALPINGECTOMY;  Surgeon: Herold Harms, MD;  Location: [facility];  Service: Gynecology;  Laterality: Bilateral;    Social History Social History   Tobacco Use   Smoking status: Every Day    Packs/day: 0.50    Years: 25.00    Additional pack years: 0.00    Total pack years: 12.50    Types: Cigarettes    Start date: 07/03/1992   Smokeless tobacco: Never  Vaping Use   Vaping Use: Never used  Substance Use Topics   Alcohol use: Yes    Alcohol/week: 2.0 standard drinks of alcohol    Types: 2 Cans of beer per week    Comment: occasional 1 drink every few weeks   Drug use: Not Currently    Types: Marijuana    Comment: None in over a year    Family History Family History  Problem Relation Age of Onset   Heart failure Mother    Hypertension Mother    Thyroid disease Mother    Lung cancer Mother    Asthma Mother    Peripheral Artery Disease Mother        stents in legs  Diabetes Father    Hypertension Father    Hypertension Brother    Heart failure Maternal Aunt    Breast cancer Maternal Aunt        great   Crohn's disease Maternal Aunt    Diabetes Maternal Grandmother    Cerebral aneurysm Maternal Grandmother    Ovarian cancer Neg Hx     Allergies  Allergen Reactions   Hepatitis B Virus Vaccines     Dysarthria      REVIEW OF SYSTEMS (Negative unless checked)  Constitutional: [] Weight loss  [] Fever  [] Chills Cardiac: [] Chest pain   [] Chest pressure   [] Palpitations   [] Shortness of breath when laying flat   [] Shortness of breath with exertion. Vascular:  [x] Pain in legs with walking   [] Pain in legs at rest  [] History of DVT   [] Phlebitis   [] Swelling in legs   [] Varicose  veins   [] Non-healing ulcers Pulmonary:   [] Uses home oxygen   [] Productive cough   [] Hemoptysis   [] Wheeze  [] COPD   [] Asthma Neurologic:  [] Dizziness   [] Seizures   [] History of stroke   [] History of TIA  [] Aphasia   [] Vissual changes   [] Weakness or numbness in arm   [] Weakness or numbness in leg Musculoskeletal:   [] Joint swelling   [] Joint pain   [] Low back pain Hematologic:  [] Easy bruising  [] Easy bleeding   [] Hypercoagulable state   [] Anemic Gastrointestinal:  [] Diarrhea   [] Vomiting  [] Gastroesophageal reflux/heartburn   [] Difficulty swallowing. Genitourinary:  [] Chronic kidney disease   [] Difficult urination  [] Frequent urination   [] Blood in urine Skin:  [] Rashes   [] Ulcers  Psychological:  [] History of anxiety   []  History of major depression.  Physical Examination  There were no vitals filed for this visit. There is no height or weight on file to calculate BMI. Gen: WD/WN, NAD Head: [place]/AT, No temporalis wasting.  Ear/Nose/Throat: Hearing grossly intact, nares w/o erythema or drainage Eyes: PER, EOMI, sclera nonicteric.  Neck: Supple, no masses.  No bruit or JVD.  Pulmonary:  Good air movement, no audible wheezing, no use of accessory muscles.  Cardiac: RRR, normal S1, S2, no Murmurs. Vascular:  mild trophic changes, no open wounds Vessel Right Left  Radial Palpable Palpable  PT Not Palpable Not Palpable  DP Not Palpable Not Palpable  Gastrointestinal: soft, non-distended. No guarding/no peritoneal signs.  Musculoskeletal: M/S 5/5 throughout.  No visible deformity.  Neurologic: CN 2-12 intact. Pain and light touch intact in extremities.  Symmetrical.  Speech is fluent. Motor exam as listed above. Psychiatric: Judgment intact, Mood & affect appropriate for pt's clinical situation. Dermatologic: No rashes or ulcers noted.  No changes consistent with cellulitis.   CBC Lab Results  Component Value Date   WBC 12.9 (H) 03/23/2022   HGB 14.1 03/23/2022   HCT 40.7 03/23/2022    MCV 90.0 03/23/2022   PLT 317 03/23/2022    BMET    Component Value Date/Time   NA 137 09/20/2022 1705   NA 137 08/02/2017 1206   K 3.4 (L) 09/20/2022 1705   K 3.6 08/02/2017 1206   CL 101 09/20/2022 1705   CO2 25 09/20/2022 1705   CO2 23 08/02/2017 1206   GLUCOSE 86 09/20/2022 1705   GLUCOSE 86 08/02/2017 1206   BUN 12 09/20/2022 1705   BUN 8.8 08/02/2017 1206   CREATININE 0.63 09/20/2022 1705   CREATININE 0.65 03/12/2022 1443   CREATININE 0.7 08/02/2017 1206   CALCIUM 9.3 09/20/2022 1705   CALCIUM  8.9 08/02/2017 1206   GFRNONAA >60 09/20/2022 1705   GFRNONAA 105 09/05/2020 1530   GFRAA 121 09/05/2020 1530   CrCl cannot be calculated (Patient's most recent lab result is older than the maximum 21 days allowed.).  COAG Lab Results  Component Value Date   INR 1.2 01/28/2022    Radiology No results found.   Assessment/Plan There are no diagnoses linked to this encounter.   Levora Dredge, MD  01/13/2023 12:14 PM

## 2023-01-14 ENCOUNTER — Ambulatory Visit (INDEPENDENT_AMBULATORY_CARE_PROVIDER_SITE_OTHER): Payer: 59 | Admitting: Vascular Surgery

## 2023-01-14 ENCOUNTER — Encounter (INDEPENDENT_AMBULATORY_CARE_PROVIDER_SITE_OTHER): Payer: Self-pay | Admitting: Vascular Surgery

## 2023-01-14 VITALS — BP 119/85 | HR 70 | Resp 18 | Ht 63.0 in | Wt 239.0 lb

## 2023-01-14 DIAGNOSIS — I7121 Aneurysm of the ascending aorta, without rupture: Secondary | ICD-10-CM

## 2023-01-14 DIAGNOSIS — I10 Essential (primary) hypertension: Secondary | ICD-10-CM

## 2023-01-16 ENCOUNTER — Encounter (INDEPENDENT_AMBULATORY_CARE_PROVIDER_SITE_OTHER): Payer: Self-pay | Admitting: Vascular Surgery

## 2023-01-30 ENCOUNTER — Ambulatory Visit: Payer: Medicaid Other | Admitting: Internal Medicine

## 2023-02-12 ENCOUNTER — Ambulatory Visit: Payer: Self-pay

## 2023-02-12 ENCOUNTER — Ambulatory Visit (INDEPENDENT_AMBULATORY_CARE_PROVIDER_SITE_OTHER): Payer: 59 | Admitting: Physician Assistant

## 2023-02-12 VITALS — BP 124/76 | HR 81 | Resp 16 | Ht 63.0 in | Wt 234.0 lb

## 2023-02-12 DIAGNOSIS — J0100 Acute maxillary sinusitis, unspecified: Secondary | ICD-10-CM

## 2023-02-12 MED ORDER — AMOXICILLIN-POT CLAVULANATE 875-125 MG PO TABS: 1.0000 | ORAL_TABLET | Freq: Two times a day (BID) | ORAL | 0 refills | Status: DC

## 2023-02-12 NOTE — Telephone Encounter (Signed)
  Chief Complaint: cough- 3 weeks Symptoms: cough-productive, headache Frequency: 3 weeks Pertinent Negatives: Patient denies fever, SOB Disposition: [] ED /[] Urgent Care (no appt availability in office) / [x] Appointment(In office/virtual)/ []  [place] [facility]/ [] Home Care/ [] Refused Recommended Disposition /[] [place] [facility]/ []  Follow-up with PCP Additional Notes: Prolonged symptoms- cough- yellow/green phlegm - appointment scheduled    Reason for Disposition  Cough has been present for > 3 weeks  Answer Assessment - Initial Assessment Questions 1. ONSET: "When did the cough begin?"      3 weeks 2. SEVERITY: "How bad is the cough today?"      A lot 3. SPUTUM: "Describe the color of your sputum" (none, dry cough; clear, white, yellow, green)     Yellow/green 4. HEMOPTYSIS: "Are you coughing up any blood?" If so ask: "How much?" (flecks, streaks, tablespoons, etc.)     no 5. DIFFICULTY BREATHING: "Are you having difficulty breathing?" If Yes, ask: "How bad is it?" (e.g., mild, moderate, severe)    - MILD: No SOB at rest, mild SOB with walking, speaks normally in sentences, can lie down, no retractions, pulse < 100.    - MODERATE: SOB at rest, SOB with minimal exertion and prefers to sit, cannot lie down flat, speaks in phrases, mild retractions, audible wheezing, pulse 100-120.    - SEVERE: Very SOB at rest, speaks in single words, struggling to breathe, sitting hunched forward, retractions, pulse > 120      No- sometimes deep breath causes pain-L 6. FEVER: "Do you have a fever?" If Yes, ask: "What is your temperature, how was it measured, and when did it start?"     no  10. OTHER SYMPTOMS: "Do you have any other symptoms?" (e.g., runny nose, wheezing, chest pain)       headache  Protocols used: Cough - Acute Productive-A-AH

## 2023-02-12 NOTE — Telephone Encounter (Signed)
Message from Cory Munch sent at 02/12/2023  8:19 AM EDT  Summary: cough   Cough for three weeks, phlegm green/yellow, no fever   (declined appt) Has been treating it with otc meds,        Called pt and LM on VM to call back for assessment.

## 2023-02-12 NOTE — Patient Instructions (Addendum)
To further assist with symptoms based on your symptoms and duration of illness, I believe you may have a bacterial sinus infection  These typically resolve with antibiotic therapy along with at-home comfort measures  Today I have sent in a prescription for Augmentin 2000-125 mg to be taken by mouth twice per day for 7 days FINISH THE ENTIRE COURSE unless you develop an allergic reaction or are instructed to discontinue.  It can take a few days for the antibiotic to kick in so I recommend symptomatic relief with over the counter medication such as the following: Dayquil/ Nyquil Theraflu Alkaseltzer  I also recommend Mucinex and adding an antihistamine daily to help with clearing up the mucus and pressure You can add Flonase as well to your regimen to relieve some of the congestion and runny nose   These medications typically have Tylenol in them already so you can take Ibuprofen as needed for further pain/ discomfort and fever management/ do not need to supplement with more outside of those medications  Stay well hydrated with at least 75 oz of water per day to help with recovery  If you notice any of the following please let us know: increased fever not responding to Tylenol or Ibuprofen, swelling around your nose or eyes, difficulty seeing,

## 2023-02-12 NOTE — Progress Notes (Signed)
Acute Office Visit   Patient: Jill Davidson   DOB: [birth year]   52 y.o. Female  MRN: [id number] Visit Date: 02/12/2023  Today's healthcare provider: Oswaldo Conroy Sreeja Spies, PA-C  Introduced myself to the patient as a Secondary school teacher and provided education on APPs in clinical practice.    Chief Complaint  Patient presents with   Cough    X3 weeks. Was productive- last 2 days non-productive. OTC meds not working.   Subjective    HPI HPI     Cough    Additional comments: X3 weeks. Was productive- last 2 days non-productive. OTC meds not working.      Last edited by Dollene Primrose, CMA on 02/12/2023  1:58 PM.       Cough  Onset: gradual  Duration: about 3 weeks Associated symptoms: productive cough, sinus pressure and headaches, upper abdominal pain with coughing, some back pain as well  Reports she feels like the cough is becoming more dry but she is concerned that symptoms have lasted so long  Interventions: OTC cough and cold medications, allergy medications Recent sick contacts: her grandson has also had a cough and allergies lately  She denies previous history of asthma or COPD   Medications: Outpatient Medications Prior to Visit  Medication Sig   aspirin EC 81 MG tablet Take 81 mg by mouth daily. Swallow whole.   losartan-hydrochlorothiazide (HYZAAR) 100-12.5 MG tablet Take 1 tablet by mouth daily.   No facility-administered medications prior to visit.    Review of Systems  Constitutional:  Positive for fatigue. Negative for chills and fever.  HENT:  Positive for congestion, postnasal drip, rhinorrhea, sinus pressure and sinus pain. Negative for ear pain.   Respiratory:  Positive for cough and shortness of breath.   Gastrointestinal:  Negative for diarrhea, nausea and vomiting.  Musculoskeletal:  Negative for myalgias.  Neurological:  Positive for headaches.       Objective    BP 124/76   Pulse 81   Resp 16   Ht 5\' 3"  (1.6 m)   Wt 234 lb (106.1 kg)   LMP  03/21/2018 (Exact Date)   SpO2 98%   BMI 41.45 kg/m    Physical Exam Vitals reviewed.  Constitutional:      General: She is awake.     Appearance: Normal appearance. She is well-developed and well-groomed.  HENT:     Head: Normocephalic and atraumatic.     Right Ear: Hearing, tympanic membrane and ear canal normal.     Left Ear: Hearing and ear canal normal. Tympanic membrane is erythematous and retracted. Tympanic membrane is not injected, scarred, perforated or bulging.     Mouth/Throat:     Lips: Pink.     Mouth: Mucous membranes are moist.     Tongue: No lesions.     Pharynx: Uvula midline. Pharyngeal swelling present. No oropharyngeal exudate or posterior oropharyngeal erythema.     Tonsils: No tonsillar exudate. 1+ on the left.  Cardiovascular:     Rate and Rhythm: Normal rate and regular rhythm.     Heart sounds: Normal heart sounds. No murmur heard.    No friction rub. No gallop.  Pulmonary:     Effort: Pulmonary effort is normal.     Breath sounds: Normal breath sounds. No decreased air movement. No decreased breath sounds, wheezing, rhonchi or rales.  Musculoskeletal:     Cervical back: Normal range of motion and neck supple.  Lymphadenopathy:  Head:     Right side of head: No submental, submandibular or preauricular adenopathy.     Left side of head: No submental, submandibular or preauricular adenopathy.     Cervical:     Right cervical: No superficial or posterior cervical adenopathy.    Left cervical: No superficial or posterior cervical adenopathy.     Upper Body:     Right upper body: No supraclavicular adenopathy.     Left upper body: No supraclavicular adenopathy.  Skin:    General: Skin is warm.  Neurological:     General: No focal deficit present.     Mental Status: She is alert and oriented to person, place, and time. Mental status is at baseline.  Psychiatric:        Mood and Affect: Mood normal.        Behavior: Behavior normal. Behavior is  cooperative.        Thought Content: Thought content normal.        Judgment: Judgment normal.       No results found for any visits on 02/12/23.  Assessment & Plan      No follow-ups on file.        Problem List Items Addressed This Visit   None Visit Diagnoses     Acute maxillary sinusitis, recurrence not specified    -  Primary Acute, new concern Patient reports ongoing symptoms comprised of congestion, sinus productive cough, sinus pressure and headaches for the last 3 weeks that is not improving with home measures Given prolonged nature and lack of resolution suspect bacterial sinusitis Will provide course of Augmentin p.o. twice daily x 10 days to assist with symptoms Recommend that she continues to take over-the-counter medications such as Mucinex, Robitussin, Tylenol to further assist with symptoms Reviewed ED and return precautions Follow-up as needed for persistent or progressing symptoms   Relevant Medications   amoxicillin-clavulanate (AUGMENTIN) 875-125 MG tablet        No follow-ups on file.   I, Varie Machamer E Wise Fees, PA-C, have reviewed all documentation for this visit. The documentation on 02/18/23 for the exam, diagnosis, procedures, and orders are all accurate and complete.   Jacquelin Hawking, MHS, PA-C [facility]

## 2023-03-22 ENCOUNTER — Ambulatory Visit: Payer: 59 | Admitting: Internal Medicine

## 2023-05-14 ENCOUNTER — Encounter: Payer: 59 | Admitting: Family Medicine

## 2023-05-30 ENCOUNTER — Ambulatory Visit: Payer: 59 | Admitting: Internal Medicine

## 2023-07-02 ENCOUNTER — Ambulatory Visit (INDEPENDENT_AMBULATORY_CARE_PROVIDER_SITE_OTHER): Payer: 59 | Admitting: Family Medicine

## 2023-07-02 ENCOUNTER — Ambulatory Visit: Payer: Self-pay

## 2023-07-02 VITALS — BP 120/76 | HR 94 | Resp 16 | Ht 63.0 in | Wt 240.0 lb

## 2023-07-02 DIAGNOSIS — J302 Other seasonal allergic rhinitis: Secondary | ICD-10-CM | POA: Diagnosis not present

## 2023-07-02 DIAGNOSIS — J22 Unspecified acute lower respiratory infection: Secondary | ICD-10-CM | POA: Diagnosis not present

## 2023-07-02 DIAGNOSIS — J209 Acute bronchitis, unspecified: Secondary | ICD-10-CM

## 2023-07-02 DIAGNOSIS — J0191 Acute recurrent sinusitis, unspecified: Secondary | ICD-10-CM

## 2023-07-02 DIAGNOSIS — F17210 Nicotine dependence, cigarettes, uncomplicated: Secondary | ICD-10-CM | POA: Diagnosis not present

## 2023-07-02 DIAGNOSIS — Z8701 Personal history of pneumonia (recurrent): Secondary | ICD-10-CM

## 2023-07-02 MED ORDER — LORATADINE 10 MG PO TABS: 10.0000 mg | ORAL_TABLET | Freq: Every day | ORAL | 11 refills | Status: AC

## 2023-07-02 MED ORDER — MONTELUKAST SODIUM 10 MG PO TABS: 10.0000 mg | ORAL_TABLET | Freq: Every day | ORAL | 3 refills | Status: DC

## 2023-07-02 MED ORDER — PREDNISONE 20 MG PO TABS: 40.0000 mg | ORAL_TABLET | Freq: Every day | ORAL | 0 refills | Status: DC

## 2023-07-02 MED ORDER — ALBUTEROL SULFATE HFA 108 (90 BASE) MCG/ACT IN AERS: 2.0000 | INHALATION_SPRAY | Freq: Four times a day (QID) | RESPIRATORY_TRACT | 0 refills | Status: DC | PRN

## 2023-07-02 MED ORDER — CEFDINIR 300 MG PO CAPS: 300.0000 mg | ORAL_CAPSULE | Freq: Two times a day (BID) | ORAL | 0 refills | Status: AC

## 2023-07-02 MED ORDER — AZITHROMYCIN 250 MG PO TABS: ORAL_TABLET | ORAL | 0 refills | Status: DC

## 2023-07-02 NOTE — Progress Notes (Signed)
Patient ID: Jill Davidson, female    DOB: 1971-03-31, 52 y.o.   MRN: [id number]  PCP: Alba Cory, MD  Chief Complaint  Patient presents with   Sinusitis    X3 weeks, negative home covid test   Cough    Worsening, productive    Subjective:   Jill Davidson is a 52 y.o. female, presents to clinic with CC of the following:  HPI  Pt presents with 3 weeks nasal congestino and URI sx and worsening cough, productive with green sputum some associated wheeze, also associated Body aches, nose stuffy, congestion, headache, sore throat, sinus issues    Patient Active Problem List   Diagnosis Date Noted   Screening for colon cancer 11/26/2022   Polyp of ascending colon 11/26/2022   Rectal polyp 11/26/2022   Leg cramp 09/21/2022   Aneurysm of ascending aorta without rupture (HCC) 04/10/2022   Palpitations 04/10/2022   Abnormal CT scan of lung 04/10/2022   Dysarthria 01/29/2022   Adverse drug reaction 01/29/2022   CAP (community acquired pneumonia) 01/28/2022   Tobacco user 07/24/2018   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 Swallow whole., Disp: , Rfl:    losartan-hydrochlorothiazide (HYZAAR) 100-12.5 MG tablet, Take 1 tablet by mouth daily., Disp: 90 tablet, Rfl: 3   Allergies  Allergen Reactions   Hepatitis B Virus Vaccines     Dysarthria      Social History   Tobacco Use   Smoking status: Every Day    Current packs/day: 0.50    Average packs/day: 0.5 packs/day for 31.0 years (15.5 ttl pk-yrs)    Types:  Cigarettes    Start date: 07/03/1992   Smokeless tobacco: Never  Vaping Use   Vaping status: Never Used  Substance Use Topics   Alcohol use: Yes    Alcohol/week: 2.0 standard drinks of alcohol    Types: 2 Cans of beer per week    Comment: occasional 1 drink every few weeks   Drug use: Not Currently    Types: Marijuana    Comment: None in over a year      Chart Review Today: I personally reviewed active problem list, medication list, allergies, family history, social history, health maintenance, notes from last encounter, lab results, imaging with the patient/caregiver today.   Review of Systems  Constitutional: Negative.   HENT: Negative.    Eyes: Negative.   Respiratory: Negative.    Cardiovascular: Negative.   Gastrointestinal: Negative.   Endocrine: Negative.   Genitourinary: Negative.   Musculoskeletal: Negative.   Skin: Negative.   Allergic/Immunologic: Negative.   Neurological: Negative.   Hematological: Negative.   Psychiatric/Behavioral: Negative.    All other systems reviewed and are negative.      Objective:   Vitals:   07/02/23 1356  BP: 120/76  Pulse: 94  Resp: 16  SpO2: 96%  Weight: 240 lb (108.9 kg)  Height: 5\' 3"  (1.6 m)    Body mass index is 42.51 kg/m.  Physical Exam Vitals and nursing note reviewed.  Constitutional:      General:  She is not in acute distress.    Appearance: She is well-developed. She is obese. She is not ill-appearing, toxic-appearing or diaphoretic.  HENT:     Head: Normocephalic and atraumatic.     Right Ear: Hearing, tympanic membrane, ear canal and external ear normal.     Left Ear: Hearing, tympanic membrane, ear canal and external ear normal.     Nose: Mucosal edema, congestion and rhinorrhea (profuse) present. Rhinorrhea is clear.     Right Turbinates: Enlarged and swollen.     Left Turbinates: Enlarged and swollen.     Right Sinus: Maxillary sinus tenderness and frontal sinus tenderness present.     Left  Sinus: Maxillary sinus tenderness and frontal sinus tenderness present.     Mouth/Throat:     Mouth: Mucous membranes are normal. Mucous membranes are moist. Mucous membranes are not pale.     Pharynx: Oropharynx is clear. Uvula midline. No oropharyngeal exudate, posterior oropharyngeal erythema or uvula swelling.     Tonsils: No tonsillar abscesses.  Eyes:     General: No scleral icterus.       Right eye: No discharge.        Left eye: No discharge.     Conjunctiva/sclera: Conjunctivae normal.     Comments: Bilateral eye watery  Neck:     Trachea: No tracheal deviation.  Cardiovascular:     Rate and Rhythm: Normal rate and regular rhythm.     Heart sounds: Normal heart sounds.  Pulmonary:     Effort: Pulmonary effort is normal. No tachypnea, accessory muscle usage or respiratory distress.     Breath sounds: No stridor or transmitted upper airway sounds. Wheezing and rhonchi present. No decreased breath sounds.  Abdominal:     General: Bowel sounds are normal. There is no distension.     Palpations: Abdomen is soft.  Musculoskeletal:        General: Normal range of motion.     Cervical back: Normal range of motion and neck supple.  Skin:    General: Skin is warm and dry.     Coloration: Skin is not pale.     Findings: No rash.  Neurological:     Mental Status: She is alert.     Motor: No abnormal muscle tone.     Coordination: Coordination normal.  Psychiatric:        Mood and Affect: Mood and affect normal.        Behavior: Behavior normal.           Assessment & Plan:   1. Acute bronchitis, unspecified organism Wheeze, cough productive, fatigue, SOB, chest wall hurting, current smoker, tx with Mucinex antihistamines, over-the-counter cough medications, start a burst of steroids, taught patient and explained how to use inhaler - predniSONE (DELTASONE) 20 MG tablet; Take 2 tablets (40 mg total) by mouth daily.  Dispense: 10 tablet; Refill: 0 - albuterol (VENTOLIN HFA)  108 (90 Base) MCG/ACT inhaler; Inhale 2 puffs into the lungs every 6 (six) hours as needed for wheezing or shortness of breath.  Dispense: 8 g; Refill: 0 - DG Chest 2 View; Future - azithromycin (ZITHROMAX) 250 MG tablet; Take 2 tabs (500 mg) PO qd x 1, then take 1 tab (250 mg) PO qd for day 2-5  Dispense: 6 each; Refill: 0  2. Seasonal allergies She has been off her allergy medication, recent change of seasons/weather - restarting her g2nd gen antihistamine and singulair will likely help her sx (runny nose, sneezing, watery itchy  eyes)  - montelukast (SINGULAIR) 10 MG tablet; Take 1 tablet (10 mg total) by mouth at bedtime.  Dispense: 30 tablet; Refill: 3  3. History of pneumonia Last year - septic/confused sx very similar to how she has been feeling - cefdinir (OMNICEF) 300 MG capsule; Take 1 capsule (300 mg total) by mouth 2 (two) times daily for 7 days.  Dispense: 14 capsule; Refill: 0 - azithromycin (ZITHROMAX) 250 MG tablet; Take 2 tabs (500 mg) PO qd x 1, then take 1 tab (250 mg) PO qd for day 2-5  Dispense: 6 each; Refill: 0  4. Acute lower respiratory infection Pt current smoker - getting very fatigued and having a lot of sx similar to last year when she had pneumonia and sepsis and was in the hospital - in light of this hx and pt concerns will tx more aggressively - cefdinir (OMNICEF) 300 MG capsule; Take 1 capsule (300 mg total) by mouth 2 (two) times daily for 7 days.  Dispense: 14 capsule; Refill: 0 - azithromycin (ZITHROMAX) 250 MG tablet; Take 2 tabs (500 mg) PO qd x 1, then take 1 tab (250 mg) PO qd for day 2-5  Dispense: 6 each; Refill: 0  5. Acute recurrent sinusitis, unspecified location Last abx was in May - augmentin - choosing different abx to cover pulm infection and avoid causing GI upset with steroid (avoiding doxy)  - cefdinir (OMNICEF) 300 MG capsule; Take 1 capsule (300 mg total) by mouth 2 (two) times daily for 7 days.  Dispense: 14 capsule; Refill: 0   Back up CXR  with hx of CAP sepsis -she will go to outpatient imaging if she does not start to improve this week, she currently has no increased work of breathing and vital signs are stable      Danelle Berry, PA-C 07/02/23 1:59 PM

## 2023-07-02 NOTE — Telephone Encounter (Signed)
  Chief Complaint: Cough Symptoms: Productive cough, greenish. Sore throat, sinus pain, body aches, stuffy nose. Negative covid, home test Frequency: 3 weeks, worsening cough  Pertinent Negatives: Patient denies fever Disposition: [] ED /[] Urgent Care (no appt availability in office) / [x] Appointment(In office/virtual)/ []  [place] [facility]/ [] Home Care/ [] Refused Recommended Disposition /[]  Mobile Bus/ []  Follow-up with PCP Additional Notes: Appt secured for today. Care advise provided, verbalizes understanding. Advised to wear mask to practice. Reason for Disposition  Wheezing is present  Answer Assessment - Initial Assessment Questions 1. ONSET: "When did the cough begin?"      3 weeks ago, worsening 2. SEVERITY: "How bad is the cough today?"      Awake at HS 3. SPUTUM: "Describe the color of your sputum" (none, dry cough; clear, white, yellow, green)     Greenish 4. HEMOPTYSIS: "Are you coughing up any blood?" If so ask: "How much?" (flecks, streaks, tablespoons, etc.)     Nasal drainage with scant amount of blood this AM 5. DIFFICULTY BREATHING: "Are you having difficulty breathing?" If Yes, ask: "How bad is it?" (e.g., mild, moderate, severe)    - MILD: No SOB at rest, mild SOB with walking, speaks normally in sentences, can lie down, no retractions, pulse < 100.    - MODERATE: SOB at rest, SOB with minimal exertion and prefers to sit, cannot lie down flat, speaks in phrases, mild retractions, audible wheezing, pulse 100-120.    - SEVERE: Very SOB at rest, speaks in single words, struggling to breathe, sitting hunched forward, retractions, pulse > 120      SOme wheezing 6. FEVER: "Do you have a fever?" If Yes, ask: "What is your temperature, how was it measured, and when did it start?"     No 7. CARDIAC HISTORY: "Do you have any history of heart disease?" (e.g., heart attack, congestive heart failure)       8. LUNG HISTORY: "Do you have any history of lung disease?"   (e.g., pulmonary embolus, asthma, emphysema)      9. PE RISK FACTORS: "Do you have a history of blood clots?" (or: recent major surgery, recent prolonged travel, bedridden)      10. OTHER SYMPTOMS: "Do you have any other symptoms?" (e.g., runny nose, wheezing, chest pain)       Body aches, nose stuffy, congestion, headache, sore throat, sinus issues  Protocols used: Cough - Acute Productive-A-AH

## 2023-07-02 NOTE — Telephone Encounter (Signed)
Patient called, left VM to return the call to the office to speak to the NT.    Summary: head congestion+ nose & chest congestion   Patient called and states she has got worse from last week and she states she is having congestion in her head, nose, and chest. Also body aches. Patient is wanting to be tested, patient states she took a home covid test and it was negative. No appt's until tomorrow, patient states she can not do the appt offered for tomorrrow and needs some relief.  Patients callback # [phone]

## 2023-07-10 ENCOUNTER — Ambulatory Visit: Payer: 59 | Attending: Internal Medicine | Admitting: Internal Medicine

## 2023-07-10 ENCOUNTER — Encounter: Payer: Self-pay | Admitting: Internal Medicine

## 2023-07-10 VITALS — BP 110/70 | HR 83 | Ht 63.0 in | Wt 243.0 lb

## 2023-07-10 DIAGNOSIS — J988 Other specified respiratory disorders: Secondary | ICD-10-CM

## 2023-07-10 DIAGNOSIS — R0789 Other chest pain: Secondary | ICD-10-CM | POA: Diagnosis not present

## 2023-07-10 DIAGNOSIS — I7121 Aneurysm of the ascending aorta, without rupture: Secondary | ICD-10-CM

## 2023-07-10 DIAGNOSIS — I10 Essential (primary) hypertension: Secondary | ICD-10-CM

## 2023-07-10 NOTE — Progress Notes (Unsigned)
  Cardiology Office Note:  .   Date:  07/10/2023  ID:  Jill Davidson, DOB 1971/06/26, MRN [id number] PCP: Alba Cory, MD  [facility] Providers Cardiologist:  None { Click to update primary MD,subspecialty MD or APP then REFRESH:1}    History of Present Illness: .   Jill Davidson is a 52 y.o. 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  I last saw her in January, at which time she was feeling fairly well without any further palpitations.  Her only complaint was of intermittent leg cramps at night.  Labs at that visit showed slightly low potassium; she was encouraged to increase her dietary potassium intake.  Otherwise, no further testing or intervention was pursued.  Sick with cough 2-3 weeks ago.  Was having cramps/spasms under left breast.  Happened 3 times, came on randomly.  Still coughing but getting better.  Feels ok today, yesterday felt terrible.  + shortness of breath, especially at work.  No fevers.  Home COVID test was negative.  Saw someone else at PCP's office and given multiple meds including abx.  Started 1 week.  Getting somewhat better.  Didn't have CXR done.  No orthopnea or edema.  Put on 10 pounds in last month.  ROS: See HPI  Studies Reviewed: Marland Kitchen   EKG Interpretation Date/Time:  Wednesday July 10 2023 15:32:25 EDT Ventricular Rate:  83 PR Interval:  144 QRS Duration:  68 QT Interval:  364 QTC Calculation: 427 R Axis:   60  Text Interpretation: Normal sinus rhythm Nonspecific T wave abnormality Abnormal ECG When compared with ECG of 07-Jun-2022 No significant change was found Confirmed by Makinsey Pepitone, Cristal Deer [phone]) on 07/10/2023 3:57:57 PM    *** Risk  Assessment/Calculations:   {Does this patient have ATRIAL FIBRILLATION?[phone]}         Physical Exam:   VS:  BP 110/70 (BP Location: Left Arm, Patient Position: Sitting, Cuff Size: Large)   Pulse 83   Ht 5\' 3"  (1.6 m)   Wt 243 lb (110.2 kg)   LMP 03/21/2018 (Exact Date)   SpO2 98%   BMI 43.05 kg/m    Wt Readings from Last 3 Encounters:  07/10/23 243 lb (110.2 kg)  07/02/23 240 lb (108.9 kg)  02/12/23 234 lb (106.1 kg)    General:  NAD. Neck: No JVD or HJR. Lungs: Clear to auscultation bilaterally without wheezes or crackles. Heart: Regular rate and rhythm without murmurs, rubs, or gallops. Abdomen: Soft, nontender, nondistended. Extremities: No lower extremity edema.  ASSESSMENT AND PLAN: .    ***    {Are you ordering a CV Procedure (e.g. stress test, cath, DCCV, TEE, etc)?   Press F2        :[id number]}  Dispo: ***  Signed, Yvonne Kendall, MD

## 2023-07-10 NOTE — Patient Instructions (Signed)

## 2023-07-11 ENCOUNTER — Encounter: Payer: Self-pay | Admitting: Internal Medicine

## 2023-07-11 DIAGNOSIS — R0789 Other chest pain: Secondary | ICD-10-CM | POA: Insufficient documentation

## 2023-07-11 DIAGNOSIS — J988 Other specified respiratory disorders: Secondary | ICD-10-CM | POA: Insufficient documentation

## 2023-08-13 NOTE — Progress Notes (Deleted)
Name: Jill Davidson   MRN: [id number]    DOB: 13-Oct-1970   Date:08/13/2023       Progress Note  Subjective  Chief Complaint  No chief complaint on file.   HPI  Patient presents for annual CPE.  Diet: *** Exercise: ***  Last Eye Exam: *** Last Dental Exam: ***  Flowsheet Row Office Visit from 07/02/2023 in [facility]  AUDIT-C Score 3       Depression: Phq 9 is  {Desc; negative/positive:13464}    07/02/2023    1:46 PM 02/12/2023    1:52 PM 11/07/2022    2:47 PM 06/25/2022   10:14 AM 05/09/2022   10:15 AM  Depression screen PHQ 2/9  Decreased Interest 0 0 0 3 0  Down, Depressed, Hopeless 0 0 0 0 0  PHQ - 2 Score 0 0 0 3 0  Altered sleeping   0 3 3  Tired, decreased energy   0 1 3  Change in appetite   0 3 3  Feeling bad or failure about yourself    0 0 0  Trouble concentrating   0 3 0  Moving slowly or fidgety/restless   0 0 0  Suicidal thoughts   0 0 0  PHQ-9 Score   0 13 9   Hypertension: BP Readings from Last 3 Encounters:  07/10/23 110/70  07/02/23 120/76  02/12/23 124/76   Obesity: Wt Readings from Last 3 Encounters:  07/10/23 243 lb (110.2 kg)  07/02/23 240 lb (108.9 kg)  02/12/23 234 lb (106.1 kg)   BMI Readings from Last 3 Encounters:  07/10/23 43.05 kg/m  07/02/23 42.51 kg/m  02/12/23 41.45 kg/m     Vaccines:   HPV:  Tdap: 09/10/19 Shingrix:  Pneumonia: 09/11/19 Flu:  COVID-19:02/05/20, 01/08/2020   Hep C Screening:  STD testing and prevention (HIV/chl/gon/syphilis):  Intimate partner violence: {Desc; negative/positive:13464} screen  Sexual History : Menstrual History/LMP/Abnormal Bleeding:  Discussed importance of follow up if any post-menopausal bleeding: {Response; yes/no/na:63}  Incontinence Symptoms: {Desc; negative/positive:13464} for symptoms   Breast cancer:  - Last Mammogram: 07/12/2022 - BRCA gene screening:   Osteoporosis Prevention : Discussed high calcium and vitamin D supplementation,  weight bearing exercises Bone density :not applicable   Cervical cancer screening:   Skin cancer: Discussed monitoring for atypical lesions  Colorectal cancer: 11/26/2022   Lung cancer:  Low Dose CT Chest recommended if Age 64-80 years, 20 pack-year currently smoking OR have quit w/in 15years. Patient {DOES NOT does:27190::"does not"} qualify for screen   ECG: ***  Advanced Care Planning: A voluntary discussion about advance care planning including the explanation and discussion of advance directives.  Discussed health care proxy and Living will, and the patient was able to identify a health care proxy as ***.  Patient {DOES_DOES NUU:72536} have a living will and power of attorney of health care   Lipids: Lab Results  Component Value Date   CHOL 134 01/30/2022   CHOL 159 09/05/2020   CHOL 146 06/03/2017   Lab Results  Component Value Date   HDL <10 (L) 01/30/2022   HDL 39 (L) 09/05/2020   HDL 44 (L) 06/03/2017   Lab Results  Component Value Date   LDLCALC NOT CALCULATED 01/30/2022   LDLCALC 101 (H) 09/05/2020   LDLCALC 86 06/03/2017   Lab Results  Component Value Date   TRIG 226 (H) 01/30/2022   TRIG 94 09/05/2020   TRIG 71 06/03/2017   Lab Results  Component Value Date  CHOLHDL NOT CALCULATED 01/30/2022   CHOLHDL 4.1 09/05/2020   CHOLHDL 3.3 06/03/2017   No results found for: "LDLDIRECT"  Glucose: Glucose  Date Value Ref Range Status  08/02/2017 86 70 - 140 mg/dl Final    Comment:    Glucose reference range is for nonfasting patients. Fasting glucose reference range is 70- 100.   Glucose, Bld  Date Value Ref Range Status  09/20/2022 86 70 - 99 mg/dL Final    Comment:    Glucose reference range applies only to samples taken after fasting for at least 8 hours.  03/12/2022 96 65 - 139 mg/dL Final    Comment:    .        Non-fasting reference interval .   01/29/2022 105 (H) 70 - 99 mg/dL Final    Comment:    Glucose reference range applies only to  samples taken after fasting for at least 8 hours.   Glucose-Capillary  Date Value Ref Range Status  01/28/2022 183 (H) 70 - 99 mg/dL Final    Comment:    Glucose reference range applies only to samples taken after fasting for at least 8 hours.    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) 06/05/2018   Vasomotor symptoms due to menopause 05/06/2018   S/P laparoscopic assisted vaginal hysterectomy (LAVH) bilateral salpingectomy right oophorectomy 03/24/2018   Essential hypertension 07/25/2016   Status post endometrial ablation 07/23/2016   IDA (iron deficiency anemia) 01/25/2015    Past Surgical History:  Procedure Laterality Date   ABDOMINAL HYSTERECTOMY     CESAREAN SECTION     X2   COLONOSCOPY WITH PROPOFOL N/A 11/26/2022   Procedure: COLONOSCOPY WITH PROPOFOL;  Surgeon: Toney Reil, MD;  Location: [facility] ENDOSCOPY;  Service: Gastroenterology;  Laterality: N/A;   DILATION AND CURETTAGE OF UTERUS     DILITATION & CURRETTAGE/HYSTROSCOPY WITH NOVASURE ABLATION N/A 07/23/2016   Procedure: DILATATION & CURETTAGE/HYSTEROSCOPY WITH NOVASURE ABLATION;  Surgeon: Herold Harms, MD;  Location: [facility] ORS;  Service: Gynecology;  Laterality: N/A;   LAPAROSCOPIC VAGINAL HYSTERECTOMY WITH SALPINGECTOMY Bilateral 03/24/2018   Procedure: LAPAROSCOPIC ASSISTED VAGINAL HYSTERECTOMY WITH BILATERAL  SALPINGECTOMY;  Surgeon: Herold Harms, MD;  Location: [facility] ORS;  Service: Gynecology;  Laterality: Bilateral;    Family History  Problem Relation Age of Onset   Heart failure Mother    Hypertension Mother    Thyroid disease Mother    Lung cancer Mother    Asthma Mother    Peripheral Artery Disease Mother        stents in legs   Diabetes Father    Hypertension Father    Hypertension Brother    Heart failure Maternal Aunt    Breast cancer Maternal Aunt        great   Crohn's disease Maternal Aunt    Diabetes Maternal Grandmother    Cerebral aneurysm Maternal Grandmother    Ovarian  cancer Neg Hx     Social History   Socioeconomic History   Marital status: Single    Spouse name: Not on file   Number of children: Not on file   Years of education: Not on file   Highest education level: Associate degree: occupational, Scientist, product/process development, or vocational program  Occupational History   Not on file  Tobacco Use   Smoking status: Every Day    Current packs/day: 0.50    Average packs/day: 0.5 packs/day for 31.1 years (15.6 ttl pk-yrs)    Types: Cigarettes    Start date: 07/03/1992   Smokeless tobacco: Never  Vaping Use   Vaping status: Never Used  Substance and Sexual Activity   Alcohol use: Yes    Alcohol/week: 2.0 standard drinks of alcohol    Types: 2 Cans of beer per week    Comment: occasional 1 drink every few weeks   Drug use: Not Currently    Types: Marijuana    Comment: None in over a year   Sexual activity: Yes    Birth control/protection: None    Comment: Ablation  Other Topics Concern   Not on file  Social History Narrative   She is a Producer, television/film/video, 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, 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    Mother moved in with her   Social Determinants of Health   Financial Resource Strain: Low Risk  (07/02/2023)   Overall Financial Resource Strain (CARDIA)    Difficulty of Paying Living Expenses: Not hard at all  Food  Insecurity: Patient Declined (07/02/2023)   Hunger Vital Sign    Worried About Running Out of Food in the Last Year: Patient declined    Ran Out of Food in the Last Year: Patient declined  Transportation Needs: No Transportation Needs (07/02/2023)   PRAPARE - Administrator, Civil Service (Medical): No    Lack of Transportation (Non-Medical): No  Physical Activity: Sufficiently Active (07/02/2023)   Exercise Vital Sign    Days of Exercise per Week: 5 days    Minutes of Exercise per Session: 60 min  Stress: No Stress Concern Present (07/02/2023)   [employer] of Occupational Health - Occupational Stress Questionnaire    Feeling of Stress : Not at all  Social Connections: Moderately Isolated (07/02/2023)   Social Connection and Isolation Panel [NHANES]    Frequency of Communication with Friends and Family: More than three times a week    Frequency of Social Gatherings with Friends and Family: Three times a week    Attends Religious Services: More than 4 times per year    Active Member of Clubs or Organizations: No    Attends Banker Meetings: Not on file    Marital Status: Never married  Intimate Partner Violence: Not At Risk (05/09/2022)   Humiliation, Afraid, Rape, and Kick questionnaire    Fear of Current or Ex-Partner: No    Emotionally Abused: No    Physically Abused: No    Sexually Abused: No     Current Outpatient Medications:    albuterol (VENTOLIN HFA) 108 (90 Base) MCG/ACT inhaler, Inhale 2 puffs into the lungs every 6 (six) hours as needed for wheezing or shortness of breath., Disp: 8 g, Rfl: 0   aspirin EC 81 MG tablet, Take 81 mg by mouth daily. Swallow whole., Disp: , Rfl:    azithromycin (ZITHROMAX) 250 MG tablet, Take 2 tabs (500 mg) PO qd x 1, then  take 1 tab (250 mg) PO qd for day 2-5, Disp: 6 each, Rfl: 0   loratadine (CLARITIN) 10 MG tablet, Take 1 tablet (10 mg total) by mouth daily., Disp: 30 tablet, Rfl: 11    losartan-hydrochlorothiazide (HYZAAR) 100-12.5 MG tablet, Take 1 tablet by mouth daily., Disp: 90 tablet, Rfl: 3   montelukast (SINGULAIR) 10 MG tablet, Take 1 tablet (10 mg total) by mouth at bedtime., Disp: 30 tablet, Rfl: 3  Allergies  Allergen Reactions   Hepatitis B Virus Vaccines     Dysarthria      ROS  ***  Objective  There were no vitals filed for this visit.  There is no height or weight on file to calculate BMI.  Physical Exam ***  No results found for this or any previous visit (from the past 2160 hour(s)).   Fall Risk:    07/02/2023    1:46 PM 02/12/2023    1:52 PM 11/07/2022    2:40 PM 06/25/2022   10:08 AM 05/09/2022   10:15 AM  Fall Risk   Falls in the past year? 0 0 0 1 1  Number falls in past yr: 0 0  1 1  Injury with Fall? 0 0  1 0  Risk for fall due to : No Fall Risks No Fall Risks No Fall Risks No Fall Risks Impaired balance/gait;Impaired mobility  Follow up Falls prevention discussed Falls prevention discussed Falls prevention discussed Falls prevention discussed Falls prevention discussed   ***  Functional Status Survey:   ***  Assessment & Plan  There are no diagnoses linked to this encounter.  -USPSTF grade A and B recommendations reviewed with patient; age-appropriate recommendations, preventive care, screening tests, etc discussed and encouraged; healthy living encouraged; see AVS for patient education given to patient -Discussed importance of 150 minutes of physical activity weekly, eat two servings of fish weekly, eat one serving of tree nuts ( cashews, pistachios, pecans, almonds.Marland Kitchen) every other day, eat 6 servings of fruit/vegetables daily and drink plenty of water and avoid sweet beverages.   -Reviewed Health Maintenance: {yes [id number]}

## 2023-08-14 ENCOUNTER — Encounter: Payer: 59 | Admitting: Family Medicine

## 2023-08-14 ENCOUNTER — Encounter: Payer: 59 | Admitting: Nurse Practitioner

## 2023-08-14 DIAGNOSIS — E785 Hyperlipidemia, unspecified: Secondary | ICD-10-CM | POA: Insufficient documentation

## 2023-08-19 IMAGING — CT CT ANGIO HEAD-NECK (W OR W/O PERF)
1 of 11 series · 5 of 33 positions shown · IV contrast (APPLIED)
Comparison: Brain MRI dated 1 day prior, CT head dated 2 days prior

CLINICAL DATA: Difficulty speaking.

EXAM:
CT ANGIOGRAPHY HEAD AND NECK
TECHNIQUE: Multidetector CT imaging of the head and neck was performed using
the standard protocol during bolus administration of intravenous
contrast. Multiplanar CT image reconstructions and MIPs were
obtained to evaluate the vascular anatomy. Carotid stenosis
measurements (when applicable) are obtained utilizing NASCET
criteria, using the distal internal carotid diameter as the
denominator.

[Series 12: ax thin · axial · 0.46mm/px · z∈[-282,-62]mm · 5 of 330 slices shown]
[im 55/330  soft-tissue]
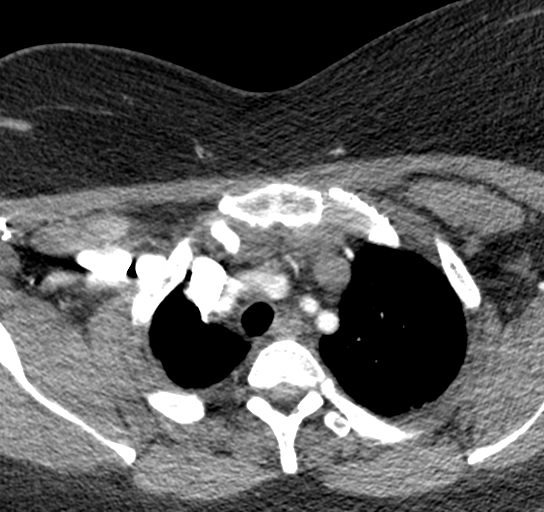
[im 110/330  bone]
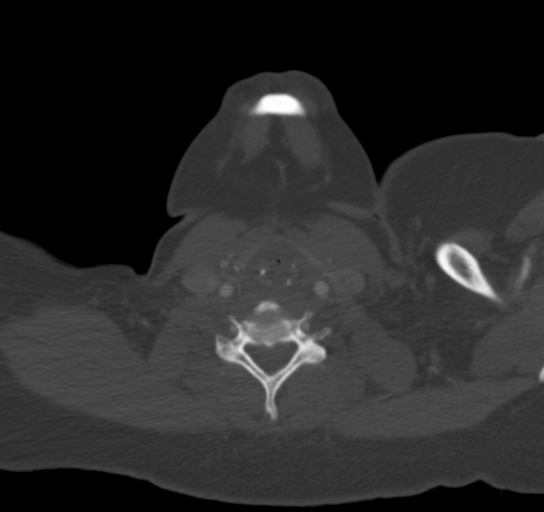
[im 165/330  soft-tissue]
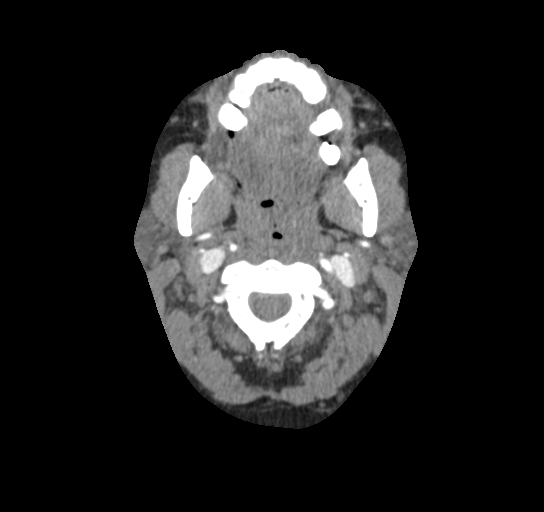
[im 220/330  bone]
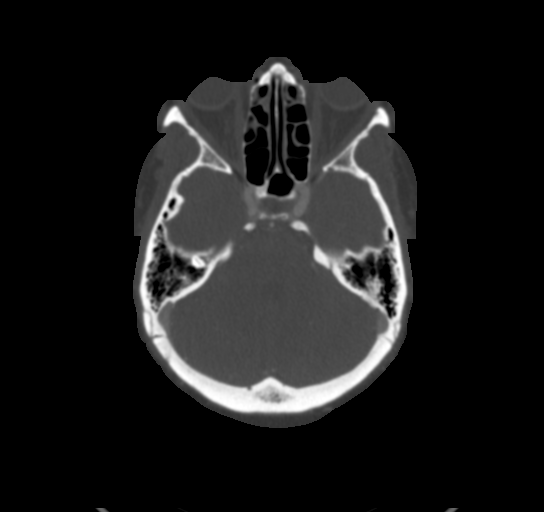
[im 275/330  soft-tissue]
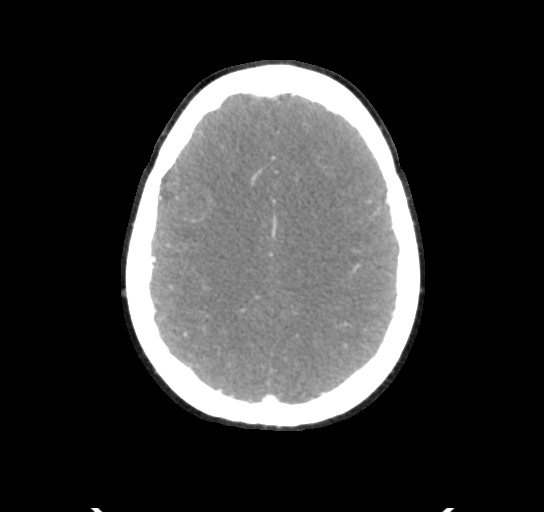

[5 of 33 positions shown; findings below may reference images not displayed]

RADIATION DOSE REDUCTION: This exam was performed according to the
departmental dose-optimization program which includes automated
exposure control, adjustment of the mA and/or kV according to
patient size and/or use of iterative reconstruction technique.

CONTRAST:  75mL OMNIPAQUE IOHEXOL 350 MG/ML SOLN
FINDINGS: CT HEAD FINDINGS

Brain: There is no acute intracranial hemorrhage, extra-axial fluid
collection, or acute infarct.

Parenchymal volume is normal. The ventricles are normal in size.
Gray-white differentiation is preserved.

There is no mass lesion. There is no mass effect or midline shift.
Low-lying cerebellar tonsils are again seen.

Vascular: See below.

Skull: Normal. Negative for fracture or focal lesion.

Sinuses: There is mild mucosal thickening in the paranasal sinuses.

Orbits: The globes and orbits are unremarkable.

Review of the MIP images confirms the above findings

CTA NECK FINDINGS

Aortic arch: The imaged aortic arch is normal. The origins of the
major branch vessels are patent. The subclavian arteries are patent
to the level imaged.

Right carotid system: The right common, internal, and external
carotid arteries are patent, without hemodynamically significant
stenosis or occlusion. There is no dissection or aneurysm.

Left carotid system: The left common, internal, and external carotid
arteries are patent, without hemodynamically significant stenosis or
occlusion. There is no dissection or aneurysm.

Vertebral arteries: The vertebral arteries are patent, without
hemodynamically significant stenosis or occlusion. There is no
dissection or aneurysm.

Skeleton: There is no acute osseous abnormality or suspicious
osseous lesion. There is no visible canal hematoma.

Other neck: There is a 1.1 cm right thyroid nodule, for which no
specific imaging follow-up is required. The soft tissues are
otherwise unremarkable.

Upper chest: A right pleural effusion is partially imaged. The main
pulmonary artery is enlarged measuring up to 3.1 cm.

Review of the MIP images confirms the above findings

CTA HEAD FINDINGS

Anterior circulation: The intracranial ICAs are patent.

The bilateral MCAs are patent.

The right A1 segment is hypoplastic/absent, a developmental variant.
Otherwise, the ACAs are patent. The anterior communicating artery is
normal.

There is no aneurysm or AVM.

Posterior circulation: The bilateral V4 segments are patent. PICA is
identified bilaterally. The basilar artery is patent.

The bilateral PCAs are patent. The posterior communicating arteries
are not identified.

There is no aneurysm or AVM.

Venous sinuses: Patent.

Anatomic variants: None.

Review of the MIP images confirms the above findings
IMPRESSION: 1. Stable noncontrast head CT with no acute intracranial pathology.
2. Normal vasculature of the head and neck.
3. Partially imaged right pleural effusion.
4. Mildly enlarged main pulmonary artery which can be seen with
pulmonary hypertension.

## 2023-08-19 NOTE — Progress Notes (Unsigned)
Name: Jill Davidson   MRN: [id number]    DOB: 07-10-1971   Date:08/19/2023       Progress Note  Subjective  Chief Complaint  No chief complaint on file.   HPI  Patient presents for annual CPE.  Diet: *** Exercise: ***  Last Eye Exam: *** Last Dental Exam: ***  Flowsheet Row Office Visit from 07/02/2023 in [facility]  AUDIT-C Score 3       Depression: Phq 9 is  {Desc; negative/positive:13464}    07/02/2023    1:46 PM 02/12/2023    1:52 PM 11/07/2022    2:47 PM 06/25/2022   10:14 AM 05/09/2022   10:15 AM  Depression screen PHQ 2/9  Decreased Interest 0 0 0 3 0  Down, Depressed, Hopeless 0 0 0 0 0  PHQ - 2 Score 0 0 0 3 0  Altered sleeping   0 3 3  Tired, decreased energy   0 1 3  Change in appetite   0 3 3  Feeling bad or failure about yourself    0 0 0  Trouble concentrating   0 3 0  Moving slowly or fidgety/restless   0 0 0  Suicidal thoughts   0 0 0  PHQ-9 Score   0 13 9   Hypertension: BP Readings from Last 3 Encounters:  07/10/23 110/70  07/02/23 120/76  02/12/23 124/76   Obesity: Wt Readings from Last 3 Encounters:  07/10/23 243 lb (110.2 kg)  07/02/23 240 lb (108.9 kg)  02/12/23 234 lb (106.1 kg)   BMI Readings from Last 3 Encounters:  07/10/23 43.05 kg/m  07/02/23 42.51 kg/m  02/12/23 41.45 kg/m     Vaccines:   HPV:  Tdap:  Shingrix:  Pneumonia:  Flu:  COVID-19:   Hep C Screening: Complete STD testing and prevention (HIV/chl/gon/syphilis): HIV Complete Intimate partner violence: {Desc; negative/positive:13464} screen  Sexual History : Menstrual History/LMP/Abnormal Bleeding:  Discussed importance of follow up if any post-menopausal bleeding: {Response; yes/no/na:63}  Incontinence Symptoms: {Desc; negative/positive:13464} for symptoms   Breast cancer:  - Last Mammogram: 07/12/2022, due/ordered - BRCA gene screening:   Osteoporosis Prevention : Discussed high calcium and vitamin D supplementation,  weight bearing exercises Bone density :not applicable   Cervical cancer screening: N/A  Skin cancer: Discussed monitoring for atypical lesions  Colorectal cancer: 11/26/2022   Lung cancer:  Low Dose CT Chest recommended if Age 21-80 years, 20 pack-year currently smoking OR have quit w/in 15years. Patient {DOES NOT does:27190::"does not"} qualify for screen   ECG: 07/10/23  Advanced Care Planning: A voluntary discussion about advance care planning including the explanation and discussion of advance directives.  Discussed health care proxy and Living will, and the patient was able to identify a health care proxy as ***.  Patient {DOES_DOES VHQ:46962} have a living will and power of attorney of health care   Lipids: Lab Results  Component Value Date   CHOL 134 01/30/2022   CHOL 159 09/05/2020   CHOL 146 06/03/2017   Lab Results  Component Value Date   HDL <10 (L) 01/30/2022   HDL 39 (L) 09/05/2020   HDL 44 (L) 06/03/2017   Lab Results  Component Value Date   LDLCALC NOT CALCULATED 01/30/2022   LDLCALC 101 (H) 09/05/2020   LDLCALC 86 06/03/2017   Lab Results  Component Value Date   TRIG 226 (H) 01/30/2022   TRIG 94 09/05/2020   TRIG 71 06/03/2017   Lab Results  Component Value  Date   CHOLHDL NOT CALCULATED 01/30/2022   CHOLHDL 4.1 09/05/2020   CHOLHDL 3.3 06/03/2017   No results found for: "LDLDIRECT"  Glucose: Glucose  Date Value Ref Range Status  08/02/2017 86 70 - 140 mg/dl Final    Comment:    Glucose reference range is for nonfasting patients. Fasting glucose reference range is 70- 100.   Glucose, Bld  Date Value Ref Range Status  09/20/2022 86 70 - 99 mg/dL Final    Comment:    Glucose reference range applies only to samples taken after fasting for at least 8 hours.  03/12/2022 96 65 - 139 mg/dL Final    Comment:    .        Non-fasting reference interval .   01/29/2022 105 (H) 70 - 99 mg/dL Final    Comment:    Glucose reference range applies only  to samples taken after fasting for at least 8 hours.   Glucose-Capillary  Date Value Ref Range Status  01/28/2022 183 (H) 70 - 99 mg/dL Final    Comment:    Glucose reference range applies only to samples taken after fasting for at least 8 hours.    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) 06/05/2018   Vasomotor symptoms due to menopause 05/06/2018   S/P laparoscopic assisted vaginal hysterectomy (LAVH) bilateral salpingectomy right oophorectomy 03/24/2018   Essential hypertension 07/25/2016   Status post endometrial ablation 07/23/2016   IDA (iron deficiency anemia) 01/25/2015    Past Surgical History:  Procedure Laterality Date   ABDOMINAL HYSTERECTOMY     CESAREAN SECTION     X2   COLONOSCOPY WITH PROPOFOL N/A 11/26/2022   Procedure: COLONOSCOPY WITH PROPOFOL;  Surgeon: Toney Reil, MD;  Location: [facility] ENDOSCOPY;  Service: Gastroenterology;  Laterality: N/A;   DILATION AND CURETTAGE OF UTERUS     DILITATION & CURRETTAGE/HYSTROSCOPY WITH NOVASURE ABLATION N/A 07/23/2016   Procedure: DILATATION & CURETTAGE/HYSTEROSCOPY WITH NOVASURE ABLATION;  Surgeon: Herold Harms, MD;  Location: [facility] ORS;  Service: Gynecology;  Laterality: N/A;   LAPAROSCOPIC VAGINAL HYSTERECTOMY WITH SALPINGECTOMY Bilateral 03/24/2018   Procedure: LAPAROSCOPIC ASSISTED VAGINAL  HYSTERECTOMY WITH BILATERAL SALPINGECTOMY;  Surgeon: Herold Harms, MD;  Location: [facility] ORS;  Service: Gynecology;  Laterality: Bilateral;    Family History  Problem Relation Age of Onset   Heart failure Mother    Hypertension Mother    Thyroid disease Mother    Lung cancer Mother    Asthma Mother    Peripheral Artery Disease Mother        stents in legs   Diabetes Father    Hypertension Father    Hypertension Brother    Heart failure Maternal Aunt    Breast cancer Maternal Aunt        great   Crohn's disease Maternal Aunt    Diabetes Maternal Grandmother    Cerebral  aneurysm Maternal Grandmother    Ovarian cancer Neg Hx     Social History   Socioeconomic History   Marital status: Single    Spouse name: Not on file   Number of children: Not on file   Years of education: Not on file   Highest education level: Associate degree: occupational, Scientist, product/process development, or vocational program  Occupational History   Not on file  Tobacco Use   Smoking status: Every Day    Current packs/day: 0.50    Average packs/day: 0.5 packs/day for 31.1 years (15.6 ttl pk-yrs)    Types: Cigarettes    Start date: 07/03/1992   Smokeless tobacco: Never  Vaping Use   Vaping status: Never Used  Substance and Sexual Activity   Alcohol use: Yes    Alcohol/week: 2.0 standard drinks of alcohol    Types: 2 Cans of beer per week    Comment: occasional 1 drink every few weeks   Drug use: Not Currently    Types: Marijuana    Comment: None in over a year   Sexual activity: Yes    Birth control/protection: None    Comment: Ablation  Other Topics Concern   Not on file  Social History Narrative   She is a Producer, television/film/video, 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, 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    Mother moved in with her   Social Determinants of Health   Financial Resource Strain: Low Risk  (07/02/2023)   Overall Financial Resource Strain (CARDIA)    Difficulty of Paying Living Expenses:  Not hard at all  Food Insecurity: Patient Declined (07/02/2023)   Hunger Vital Sign    Worried About Running Out of Food in the Last Year: Patient declined    Ran Out of Food in the Last Year: Patient declined  Transportation Needs: No Transportation Needs (07/02/2023)   PRAPARE - Administrator, Civil Service (Medical): No    Lack of Transportation (Non-Medical): No  Physical Activity: Sufficiently Active (07/02/2023)   Exercise Vital Sign    Days of Exercise per Week: 5 days    Minutes of Exercise per Session: 60 min  Stress: No Stress Concern Present (07/02/2023)   [employer] of Occupational Health - Occupational Stress Questionnaire    Feeling of Stress : Not at all  Social Connections: Moderately Isolated (07/02/2023)   Social Connection and Isolation Panel [NHANES]    Frequency of Communication with Friends and Family: More than three times a week    Frequency of Social Gatherings with Friends and Family: Three times a week    Attends Religious Services: More than 4 times per year    Active Member of Clubs or Organizations: No    Attends Banker Meetings: Not on file    Marital Status: Never married  Intimate Partner Violence: Not At Risk (05/09/2022)   Humiliation, Afraid, Rape, and Kick questionnaire    Fear of Current or Ex-Partner: No    Emotionally Abused: No    Physically Abused: No    Sexually Abused: No     Current Outpatient Medications:    albuterol (VENTOLIN HFA) 108 (90 Base) MCG/ACT inhaler, Inhale 2 puffs into the lungs every 6 (six) hours as needed for wheezing or shortness of breath., Disp: 8 g, Rfl: 0   aspirin EC 81 MG tablet, Take 81 mg by mouth daily. Swallow whole., Disp: , Rfl:    azithromycin (ZITHROMAX) 250 MG tablet, Take 2 tabs (  500 mg) PO qd x 1, then take 1 tab (250 mg) PO qd for day 2-5, Disp: 6 each, Rfl: 0   loratadine (CLARITIN) 10 MG tablet, Take 1 tablet (10 mg total) by mouth daily., Disp: 30 tablet, Rfl: 11    losartan-hydrochlorothiazide (HYZAAR) 100-12.5 MG tablet, Take 1 tablet by mouth daily., Disp: 90 tablet, Rfl: 3   montelukast (SINGULAIR) 10 MG tablet, Take 1 tablet (10 mg total) by mouth at bedtime., Disp: 30 tablet, Rfl: 3  Allergies  Allergen Reactions   Hepatitis B Virus Vaccines     Dysarthria      ROS  ***  Objective  There were no vitals filed for this visit.  There is no height or weight on file to calculate BMI.  Physical Exam ***  No results found for this or any previous visit (from the past 2160 hour(s)).   Fall Risk:    07/02/2023    1:46 PM 02/12/2023    1:52 PM 11/07/2022    2:40 PM 06/25/2022   10:08 AM 05/09/2022   10:15 AM  Fall Risk   Falls in the past year? 0 0 0 1 1  Number falls in past yr: 0 0  1 1  Injury with Fall? 0 0  1 0  Risk for fall due to : No Fall Risks No Fall Risks No Fall Risks No Fall Risks Impaired balance/gait;Impaired mobility  Follow up Falls prevention discussed Falls prevention discussed Falls prevention discussed Falls prevention discussed Falls prevention discussed   ***  Functional Status Survey:   ***  Assessment & Plan  There are no diagnoses linked to this encounter.  -USPSTF grade A and B recommendations reviewed with patient; age-appropriate recommendations, preventive care, screening tests, etc discussed and encouraged; healthy living encouraged; see AVS for patient education given to patient -Discussed importance of 150 minutes of physical activity weekly, eat two servings of fish weekly, eat one serving of tree nuts ( cashews, pistachios, pecans, almonds.Marland Kitchen) every other day, eat 6 servings of fruit/vegetables daily and drink plenty of water and avoid sweet beverages.   -Reviewed Health Maintenance: {yes YN:[id number]}

## 2023-08-20 ENCOUNTER — Encounter: Payer: 59 | Admitting: Family Medicine

## 2023-08-20 DIAGNOSIS — Z1231 Encounter for screening mammogram for malignant neoplasm of breast: Secondary | ICD-10-CM

## 2023-08-22 NOTE — Progress Notes (Signed)
Name: Jill Davidson   MRN: [id number]    DOB: Mar 02, 1971   Date:08/30/2023       Progress Note  Subjective  Chief Complaint  Chief Complaint  Patient presents with   Annual Exam    HPI  Patient presents for annual CPE.  Diet: she is eating once or twice daily, lack of appetite, but when she eats trying to eat healthy  Exercise:  continue regular physical activity  Last Eye Exam: due for a visit  Last Dental Exam: completed  Flowsheet Row Office Visit from 08/30/2023 in [facility]  AUDIT-C Score 3       Depression: Phq 9 is  negative    08/30/2023    2:10 PM 07/02/2023    1:46 PM 02/12/2023    1:52 PM 11/07/2022    2:47 PM 06/25/2022   10:14 AM  Depression screen PHQ 2/9  Decreased Interest 0 0 0 0 3  Down, Depressed, Hopeless 0 0 0 0 0  PHQ - 2 Score 0 0 0 0 3  Altered sleeping    0 3  Tired, decreased energy    0 1  Change in appetite    0 3  Feeling bad or failure about yourself     0 0  Trouble concentrating    0 3  Moving slowly or fidgety/restless    0 0  Suicidal thoughts    0 0  PHQ-9 Score    0 13   Hypertension: BP Readings from Last 3 Encounters:  08/30/23 122/78  07/10/23 110/70  07/02/23 120/76   Obesity: Wt Readings from Last 3 Encounters:  08/30/23 241 lb 8 oz (109.5 kg)  07/10/23 243 lb (110.2 kg)  07/02/23 240 lb (108.9 kg)   BMI Readings from Last 3 Encounters:  08/30/23 42.78 kg/m  07/10/23 43.05 kg/m  07/02/23 42.51 kg/m     Vaccines:  RSV: not applicable HPV: not applicable Tdap: completed Shingrix: encouraged to get at the pharmacy Pneumonia: completed Flu: completed COVID-19: declined  Hep C Screening: completed STD testing and prevention (HIV/chl/gon/syphilis): N/A Intimate partner violence: negative screen  Sexual History : not sexually active for about one year  Menstrual History/LMP/Abnormal Bleeding: s/p hysterectomy for DUB Discussed importance of follow up if any post-menopausal  bleeding: not applicable  Incontinence Symptoms: positive for symptoms urgency and stress incontinence symptoms over the past few months  Breast cancer:  - Last Mammogram: ordered today  - BRCA gene screening: N/A  Osteoporosis Prevention : Discussed high calcium and vitamin D supplementation, weight bearing exercises Bone density :not applicable   Cervical cancer screening: not applicable due to hysterectomy  Skin cancer: Discussed monitoring for atypical lesions  Colorectal cancer:   repeat in 2031 Lung cancer:  Low Dose CT Chest recommended if Age 88-80 years, 20 pack-year currently smoking OR have quit w/in 15years. Patient does qualify for screen  - advised follow up with Dr. Marcos Eke ECG:06/2022   Advanced Care Planning: A voluntary discussion about advance care planning including the explanation and discussion of advance directives.  Discussed health care proxy and Living will, and the patient was able to identify a health care proxy as mother .  Patient does not have a living will and power of attorney of health care   Patient Active Problem List   Diagnosis Date Noted   Dyslipidemia 08/14/2023   Respiratory tract infection 07/11/2023   Atypical chest pain 07/11/2023   Screening for colon cancer 11/26/2022  Polyp of ascending colon 11/26/2022   Rectal polyp 11/26/2022   Leg cramp 09/21/2022   Aneurysm of ascending aorta without rupture (HCC) 04/10/2022   Palpitations 04/10/2022   Abnormal CT scan of lung 04/10/2022   Dysarthria 01/29/2022   Adverse drug reaction 01/29/2022   CAP (community acquired pneumonia) 01/28/2022   Tobacco user 07/24/2018   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) 06/05/2018   Vasomotor symptoms due to menopause 05/06/2018   S/P laparoscopic assisted vaginal hysterectomy (LAVH) bilateral salpingectomy right oophorectomy 03/24/2018   Essential hypertension 07/25/2016    Status post endometrial ablation 07/23/2016   IDA (iron deficiency anemia) 01/25/2015    Past Surgical History:  Procedure Laterality Date   ABDOMINAL HYSTERECTOMY     CESAREAN SECTION     X2   COLONOSCOPY WITH PROPOFOL N/A 11/26/2022   Procedure: COLONOSCOPY WITH PROPOFOL;  Surgeon: Toney Reil, MD;  Location: [facility] ENDOSCOPY;  Service: Gastroenterology;  Laterality: N/A;   DILATION AND CURETTAGE OF UTERUS     DILITATION & CURRETTAGE/HYSTROSCOPY WITH NOVASURE ABLATION N/A 07/23/2016   Procedure: DILATATION & CURETTAGE/HYSTEROSCOPY WITH NOVASURE ABLATION;  Surgeon: Herold Harms, MD;  Location: [facility] ORS;  Service: Gynecology;  Laterality: N/A;   LAPAROSCOPIC VAGINAL HYSTERECTOMY WITH SALPINGECTOMY Bilateral 03/24/2018   Procedure: LAPAROSCOPIC ASSISTED VAGINAL HYSTERECTOMY WITH BILATERAL SALPINGECTOMY;  Surgeon: Herold Harms, MD;  Location: [facility] ORS;  Service: Gynecology;  Laterality: Bilateral;    Family History  Problem Relation Age of Onset   Heart failure Mother    Hypertension Mother    Thyroid disease Mother    Lung cancer Mother    Asthma Mother    Peripheral Artery Disease Mother        stents in legs   Diabetes Father    Hypertension Father    Hypertension Brother    Heart failure Maternal Aunt    Breast cancer Maternal Aunt        great   Crohn's disease Maternal Aunt    Heart disease Maternal Aunt    Diabetes Maternal Grandmother    Cerebral aneurysm Maternal Grandmother    ADD / ADHD Son    Ovarian cancer Neg Hx     Social History   Socioeconomic History   Marital status: Single    Spouse name: Not on file   Number of children: Not on file   Years of education: Not on file   Highest education level: Associate degree: occupational, Scientist, product/process development, or vocational program  Occupational History   Not on file  Tobacco Use   Smoking status: Every Day    Current packs/day: 0.50    Average packs/day: 0.5 packs/day for 31.2 years (15.6 ttl  pk-yrs)    Types: Cigarettes    Start date: 07/03/1992   Smokeless tobacco: Never  Vaping Use   Vaping status: Never Used  Substance and Sexual Activity   Alcohol use: Yes    Alcohol/week: 2.0 standard drinks of alcohol    Comment: occasional 1 drink every few weeks   Drug use: Not Currently    Types: Marijuana    Comment: None in over a year   Sexual activity: Not Currently    Birth control/protection: Condom, None    Comment: Ablation  Other Topics Concern   Not on file  Social History Narrative   She is a Producer, television/film/video, 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, 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    Mother moved in with her   Social Drivers of Health   Financial Resource Strain: Low Risk  (08/30/2023)   Overall Financial Resource Strain (CARDIA)    Difficulty of Paying Living Expenses: Not very hard  Food Insecurity: Food Insecurity Present (08/30/2023)   Hunger Vital Sign    Worried About Running Out of Food in the Last Year: Sometimes true    Ran Out of Food in the Last Year: Sometimes true  Transportation Needs: No Transportation Needs (08/30/2023)   PRAPARE - Administrator, Civil Service (Medical): No    Lack of Transportation (Non-Medical): No  Physical Activity: Sufficiently Active (08/30/2023)   Exercise Vital Sign    Days of Exercise per Week: 7 days    Minutes of Exercise per Session: 120 min  Stress: No Stress Concern Present (08/30/2023)   [employer] of Occupational Health - Occupational Stress Questionnaire    Feeling of Stress : Not at all  Social Connections: Moderately Isolated (08/30/2023)   Social Connection and Isolation Panel [NHANES]    Frequency of Communication with Friends and Family: More than three times a week    Frequency of Social Gatherings with Friends and Family: Three times a week    Attends Religious Services: More than 4 times per year    Active Member of Clubs or Organizations: No    Attends Tax inspector Meetings: Not on file    Marital Status: Never married  Intimate Partner Violence: Not At Risk (08/30/2023)   Humiliation, Afraid, Rape, and Kick questionnaire    Fear of Current or Ex-Partner: No    Emotionally Abused: No    Physically Abused: No    Sexually Abused: No     Current Outpatient Medications:    albuterol (VENTOLIN HFA) 108 (90 Base) MCG/ACT inhaler, Inhale 2 puffs into the lungs every 6 (six) hours as needed for wheezing or shortness of breath., Disp: 8 g, Rfl: 0   aspirin EC 81 MG tablet, Take 81 mg by mouth daily. Swallow whole., Disp: , Rfl:    azithromycin (ZITHROMAX) 250 MG tablet, Take 2 tabs (500 mg) PO qd x 1, then take 1 tab (250 mg) PO qd for day 2-5, Disp: 6 each, Rfl: 0   loratadine (CLARITIN) 10 MG tablet, Take 1 tablet (10 mg total) by mouth daily., Disp: 30 tablet, Rfl: 11   losartan-hydrochlorothiazide (HYZAAR) 100-12.5 MG tablet, Take 1 tablet by mouth daily., Disp: 90 tablet, Rfl: 3   montelukast (SINGULAIR) 10 MG tablet, Take 1 tablet (10 mg total) by mouth at bedtime., Disp: 30 tablet, Rfl: 3  Allergies  Allergen Reactions   Hepatitis B Virus Vaccines     Dysarthria      ROS  Constitutional: Negative for fever or weight change. Feels tired and snores  Respiratory: positive for cough and shortness of breath.   Cardiovascular: Negative for chest pain or palpitations.  Gastrointestinal: Negative for abdominal pain, no bowel changes.  Musculoskeletal: Negative for gait problem or joint swelling.  Skin: Negative for rash.  Neurological: Negative for dizziness or headache.  No other specific complaints in a complete review of systems (except as listed in HPI above).   Objective  Vitals:   08/30/23 1345  BP: 122/78  Pulse: 92  Resp: 16  Temp: 98.2 F (36.8 C)  TempSrc: Oral  SpO2: 98%  Weight: 241 lb 8 oz (109.5 kg)  Height: 5\' 3"  (1.6 m)    Body mass index  is 42.78 kg/m.  Physical Exam  Constitutional: Patient appears  well-developed and well-nourished. Obese No distress.  HENT: Head: Normocephalic and atraumatic. Ears: B TMs ok, no erythema or effusion; Nose: Nose normal. Mouth/Throat: Oropharynx is clear and moist. No oropharyngeal exudate.  Eyes: Conjunctivae and EOM are normal. Pupils are equal, round, and reactive to light. No scleral icterus.  Neck: Normal range of motion. Neck supple. No JVD present. No thyromegaly present.  Cardiovascular: Normal rate, regular rhythm and normal heart sounds.  No murmur heard. No BLE edema. Pulmonary/Chest: Effort normal and breath sounds normal. No respiratory distress. Abdominal: Soft. Bowel sounds are normal, no distension. There is no tenderness. no masses Breast: no lumps or masses, no nipple discharge or rashes FEMALE GENITALIA:  Not done  RECTAL: not done  Musculoskeletal: Normal range of motion, no joint effusions. No gross deformities Neurological: he is alert and oriented to person, place, and time. No cranial nerve deficit. Coordination, balance, strength, speech and gait are normal.  Skin: Skin is warm and dry. No rash noted. No erythema.  Psychiatric: Patient has a normal mood and affect. behavior is normal. Judgment and thought content normal.     Assessment & Plan  1. Well adult exam (Primary)  - CBC with Differential/Platelet - COMPLETE METABOLIC PANEL WITH GFR - Lipid panel - Hemoglobin A1c  2. Need for influenza vaccination  - Flu vaccine trivalent PF, 6mos and older(Flulaval,Afluria,Fluarix,Fluzone)  3. Encounter for screening mammogram for malignant neoplasm of breast  - MM 3D SCREENING MAMMOGRAM BILATERAL BREAST; Future  4. Immunization due  - Pneumococcal conjugate vaccine 20-valent  5. Snoring  - Ambulatory referral to Sleep Studies  6. Urinary frequency  - Urine Culture  7. Other fatigue  - CBC with Differential/Platelet - COMPLETE METABOLIC PANEL WITH GFR - VITAMIN D 25 Hydroxy (Vit-D Deficiency, Fractures) - B12  and Folate Panel - Ambulatory referral to Sleep Studies -TSH  8. Screening examination for STI  - RPR - HIV Antibody (routine testing w rflx)  9. Diabetes mellitus screening  A1C  10. Lipid screening  - Lipid panel  11. Thyroid nodule   TSH   -USPSTF grade A and B recommendations reviewed with patient; age-appropriate recommendations, preventive care, screening tests, etc discussed and encouraged; healthy living encouraged; see AVS for patient education given to patient -Discussed importance of 150 minutes of physical activity weekly, eat two servings of fish weekly, eat one serving of tree nuts ( cashews, pistachios, pecans, almonds.Marland Kitchen) every other day, eat 6 servings of fruit/vegetables daily and drink plenty of water and avoid sweet beverages.   -Reviewed Health Maintenance: Yes.

## 2023-08-30 ENCOUNTER — Other Ambulatory Visit: Payer: Self-pay | Admitting: Internal Medicine

## 2023-08-30 ENCOUNTER — Ambulatory Visit (INDEPENDENT_AMBULATORY_CARE_PROVIDER_SITE_OTHER): Payer: 59 | Admitting: Family Medicine

## 2023-08-30 VITALS — BP 122/78 | HR 92 | Temp 98.2°F | Resp 16 | Ht 63.0 in | Wt 241.5 lb

## 2023-08-30 DIAGNOSIS — Z1231 Encounter for screening mammogram for malignant neoplasm of breast: Secondary | ICD-10-CM

## 2023-08-30 DIAGNOSIS — Z113 Encounter for screening for infections with a predominantly sexual mode of transmission: Secondary | ICD-10-CM

## 2023-08-30 DIAGNOSIS — Z0001 Encounter for general adult medical examination with abnormal findings: Secondary | ICD-10-CM

## 2023-08-30 DIAGNOSIS — R5383 Other fatigue: Secondary | ICD-10-CM | POA: Diagnosis not present

## 2023-08-30 DIAGNOSIS — Z0000 Encounter for general adult medical examination without abnormal findings: Secondary | ICD-10-CM | POA: Diagnosis not present

## 2023-08-30 DIAGNOSIS — Z13220 Encounter for screening for lipoid disorders: Secondary | ICD-10-CM

## 2023-08-30 DIAGNOSIS — E041 Nontoxic single thyroid nodule: Secondary | ICD-10-CM | POA: Diagnosis not present

## 2023-08-30 DIAGNOSIS — Z23 Encounter for immunization: Secondary | ICD-10-CM

## 2023-08-30 DIAGNOSIS — R0683 Snoring: Secondary | ICD-10-CM | POA: Diagnosis not present

## 2023-08-30 DIAGNOSIS — R350 Frequency of micturition: Secondary | ICD-10-CM | POA: Diagnosis not present

## 2023-08-30 DIAGNOSIS — Z131 Encounter for screening for diabetes mellitus: Secondary | ICD-10-CM

## 2023-09-01 LAB — RPR: RPR Ser Ql: NONREACTIVE

## 2023-09-01 LAB — LIPID PANEL
Cholesterol: 181 mg/dL (ref <200)
HDL: 37 mg/dL — ABNORMAL LOW (ref >=50)
LDL Cholesterol (Calc): 116 mg/dL — ABNORMAL HIGH
Non-HDL Cholesterol (Calc): 144 mg/dL — ABNORMAL HIGH (ref <130)
Total CHOL/HDL Ratio: 4.9 (calc) (ref <5.0)
Triglycerides: 166 mg/dL — ABNORMAL HIGH (ref <150)

## 2023-09-01 LAB — COMPLETE METABOLIC PANEL WITH GFR
AG Ratio: 1.5 (calc) (ref 1.0–2.5)
ALT: 11 U/L (ref 6–29)
AST: 14 U/L (ref 10–35)
Albumin: 4.3 g/dL (ref 3.6–5.1)
Alkaline phosphatase (APISO): 66 U/L (ref 37–153)
BUN: 13 mg/dL (ref 7–25)
CO2: 29 mmol/L (ref 20–32)
Calcium: 10.5 mg/dL — ABNORMAL HIGH (ref 8.6–10.4)
Chloride: 102 mmol/L (ref 98–110)
Creat: 0.83 mg/dL (ref 0.50–1.03)
Globulin: 2.9 g/dL (ref 1.9–3.7)
Glucose, Bld: 88 mg/dL (ref 65–99)
Potassium: 3.8 mmol/L (ref 3.5–5.3)
Sodium: 140 mmol/L (ref 135–146)
Total Bilirubin: 0.4 mg/dL (ref 0.2–1.2)
Total Protein: 7.2 g/dL (ref 6.1–8.1)
eGFR: 86 mL/min/{1.73_m2} (ref >=60)

## 2023-09-01 LAB — CBC WITH DIFFERENTIAL/PLATELET
Absolute Lymphocytes: 5992 {cells}/uL — ABNORMAL HIGH (ref 850–3900)
Absolute Monocytes: 812 {cells}/uL (ref 200–950)
Basophils Absolute: 70 {cells}/uL (ref 0–200)
Basophils Relative: 0.5 %
Eosinophils Absolute: 126 {cells}/uL (ref 15–500)
Eosinophils Relative: 0.9 %
HCT: 44.9 % (ref 35.0–45.0)
Hemoglobin: 15.1 g/dL (ref 11.7–15.5)
MCH: 29.5 pg (ref 27.0–33.0)
MCHC: 33.6 g/dL (ref 32.0–36.0)
MCV: 87.7 fL (ref 80.0–100.0)
MPV: 10.1 fL (ref 7.5–12.5)
Monocytes Relative: 5.8 %
Neutro Abs: 7000 {cells}/uL (ref 1500–7800)
Neutrophils Relative %: 50 %
Platelets: 418 10*3/uL — ABNORMAL HIGH (ref 140–400)
RBC: 5.12 10*6/uL — ABNORMAL HIGH (ref 3.80–5.10)
RDW: 12.6 % (ref 11.0–15.0)
Total Lymphocyte: 42.8 %
WBC: 14 10*3/uL — ABNORMAL HIGH (ref 3.8–10.8)

## 2023-09-01 LAB — URINE CULTURE
MICRO NUMBER:: 15850152
SPECIMEN QUALITY:: ADEQUATE

## 2023-09-01 LAB — VITAMIN D 25 HYDROXY (VIT D DEFICIENCY, FRACTURES): Vit D, 25-Hydroxy: 17 ng/mL — ABNORMAL LOW (ref 30–100)

## 2023-09-01 LAB — HEMOGLOBIN A1C
Hgb A1c MFr Bld: 6.1 %{Hb} — ABNORMAL HIGH (ref <5.7)
Mean Plasma Glucose: 128 mg/dL
eAG (mmol/L): 7.1 mmol/L

## 2023-09-01 LAB — TSH: TSH: 1.16 m[IU]/L

## 2023-09-01 LAB — B12 AND FOLATE PANEL
Folate: 5.8 ng/mL
Vitamin B-12: 455 pg/mL (ref 200–1100)

## 2023-09-01 LAB — HIV ANTIBODY (ROUTINE TESTING W REFLEX): HIV 1&2 Ab, 4th Generation: NONREACTIVE

## 2023-09-02 ENCOUNTER — Other Ambulatory Visit: Payer: Self-pay | Admitting: Family Medicine

## 2023-09-02 DIAGNOSIS — N3090 Cystitis, unspecified without hematuria: Secondary | ICD-10-CM

## 2023-09-02 MED ORDER — SULFAMETHOXAZOLE-TRIMETHOPRIM 400-80 MG PO TABS
1.0000 | ORAL_TABLET | Freq: Two times a day (BID) | ORAL | 0 refills | Status: DC
Start: 2023-09-02 — End: 2023-10-04

## 2023-09-26 ENCOUNTER — Ambulatory Visit: Payer: 59 | Admitting: Family Medicine

## 2023-09-27 ENCOUNTER — Ambulatory Visit: Payer: 59 | Admitting: Family Medicine

## 2023-09-30 ENCOUNTER — Ambulatory Visit: Payer: 59 | Attending: Otolaryngology

## 2023-09-30 DIAGNOSIS — G4733 Obstructive sleep apnea (adult) (pediatric): Secondary | ICD-10-CM | POA: Insufficient documentation

## 2023-09-30 DIAGNOSIS — G4761 Periodic limb movement disorder: Secondary | ICD-10-CM | POA: Insufficient documentation

## 2023-10-04 ENCOUNTER — Ambulatory Visit (INDEPENDENT_AMBULATORY_CARE_PROVIDER_SITE_OTHER): Payer: 59 | Admitting: Family Medicine

## 2023-10-04 ENCOUNTER — Other Ambulatory Visit: Payer: Self-pay | Admitting: Family Medicine

## 2023-10-04 ENCOUNTER — Encounter: Payer: Self-pay | Admitting: Family Medicine

## 2023-10-04 VITALS — BP 124/76 | HR 85 | Temp 97.6°F | Resp 16 | Ht 63.0 in | Wt 241.7 lb

## 2023-10-04 DIAGNOSIS — D72829 Elevated white blood cell count, unspecified: Secondary | ICD-10-CM | POA: Insufficient documentation

## 2023-10-04 DIAGNOSIS — E041 Nontoxic single thyroid nodule: Secondary | ICD-10-CM | POA: Insufficient documentation

## 2023-10-04 DIAGNOSIS — I7121 Aneurysm of the ascending aorta, without rupture: Secondary | ICD-10-CM

## 2023-10-04 DIAGNOSIS — R7303 Prediabetes: Secondary | ICD-10-CM | POA: Diagnosis not present

## 2023-10-04 DIAGNOSIS — E559 Vitamin D deficiency, unspecified: Secondary | ICD-10-CM | POA: Insufficient documentation

## 2023-10-04 DIAGNOSIS — E320 Persistent hyperplasia of thymus: Secondary | ICD-10-CM | POA: Insufficient documentation

## 2023-10-04 DIAGNOSIS — G4733 Obstructive sleep apnea (adult) (pediatric): Secondary | ICD-10-CM | POA: Diagnosis not present

## 2023-10-04 DIAGNOSIS — J302 Other seasonal allergic rhinitis: Secondary | ICD-10-CM | POA: Insufficient documentation

## 2023-10-04 DIAGNOSIS — I10 Essential (primary) hypertension: Secondary | ICD-10-CM | POA: Diagnosis not present

## 2023-10-04 MED ORDER — LOSARTAN POTASSIUM-HCTZ 100-12.5 MG PO TABS: 1.0000 | ORAL_TABLET | Freq: Every day | ORAL | 1 refills | Status: DC

## 2023-10-04 MED ORDER — VITAMIN D3 50 MCG (2000 UT) PO CAPS: 2000.0000 [IU] | ORAL_CAPSULE | Freq: Every day | ORAL | 1 refills | Status: AC

## 2023-10-04 MED ORDER — METFORMIN HCL ER 750 MG PO TB24: 750.0000 mg | ORAL_TABLET | Freq: Every day | ORAL | 1 refills | Status: DC

## 2023-10-04 NOTE — Patient Instructions (Signed)
Read and look at videos about [facility] for weight loss

## 2023-10-04 NOTE — Progress Notes (Signed)
Name: Jill Davidson   MRN: [id number]    DOB: July 24, 1971   Date:10/04/2023       Progress Note  Subjective  Chief Complaint  Chief Complaint  Patient presents with   Medical Management of Chronic Issues   HPI   Patient was seen for a CPE end of 2024, previous to that one visit 10/2022, last regular  office visit was in 2023  She had CAP in May 2023 and during her admission to [facility] , during her stay she was found to have dysarthria and unsteady gait but MRI was negative for stroke, hse was advised to follow up with neurologist but symptoms resolved  During her post hospital stay she was seen by Dr . Cathie Hoops for paratracheal lymphadenopathy found on CT and leukocytosis . Since leukocytosis was present since 2019 , labs were ordered and advised to quit smoking. Due to persistent changed on CT chest she was evaluated by Dr. Marcos Eke ( pulmonologist) , also found to have aneurysm of ascending aorta and sent to see vascular surgeon who advised her to take baby aspirin , get bp under control , stop smoking and will continue to monitor size of aneurysm.Due to her frequent episodes of palpitation and intermittent chest pain she was seen by cardiologist, Dr. Okey Dupre. 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 Cardiac evaluation benign, also has thyroid nodule  Pre-diabetes: discussed diet, exercise, she prefers trying medication, does not want GLP-1 agonist even though it would be best for weight loss. We will start her on Metformin  Morbid obesity: BMI over 40, she will try metformin and read about [facility]  Dyslipidemia/aneursym ascending aorta without rupture: up to date with visits with vascular surgeon  HTN: BP  is at goal, continue medications. No recent episodes of palpitation  Leukocytosis: seen by Dr. Cathie Hoops in 2023   OSA; had study done on Monday, reviewed results and will send CPAP supplies , AHI was 66.6 events per hour, pulse ox was 79 % , CPAP was titrated to 14 cmH2O SpO2 went up to 92%. She has been advised to lose weight, stop smoking, avoid alcohol intake not to drive when sleepy    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) 06/05/2018   Vasomotor symptoms due to menopause 05/06/2018   S/P laparoscopic assisted vaginal hysterectomy (LAVH) bilateral salpingectomy right oophorectomy 03/24/2018   Essential hypertension 07/25/2016   Status post endometrial ablation 07/23/2016   IDA (iron deficiency anemia) 01/25/2015    Past Surgical History:  Procedure Laterality Date   ABDOMINAL HYSTERECTOMY     CESAREAN SECTION     X2  COLONOSCOPY WITH PROPOFOL N/A 11/26/2022   Procedure: COLONOSCOPY WITH PROPOFOL;  Surgeon: Toney Reil, MD;  Location: [facility] ENDOSCOPY;  Service: Gastroenterology;  Laterality: N/A;   DILATION AND CURETTAGE OF UTERUS     DILITATION & CURRETTAGE/HYSTROSCOPY WITH NOVASURE ABLATION N/A 07/23/2016   Procedure: DILATATION & CURETTAGE/HYSTEROSCOPY WITH NOVASURE ABLATION;  Surgeon: Herold Harms, MD;  Location: [facility] ORS;  Service: Gynecology;   Laterality: N/A;   LAPAROSCOPIC VAGINAL HYSTERECTOMY WITH SALPINGECTOMY Bilateral 03/24/2018   Procedure: LAPAROSCOPIC ASSISTED VAGINAL HYSTERECTOMY WITH BILATERAL SALPINGECTOMY;  Surgeon: Herold Harms, MD;  Location: [facility] ORS;  Service: Gynecology;  Laterality: Bilateral;    Family History  Problem Relation Age of Onset   Heart failure Mother    Hypertension Mother    Thyroid disease Mother    Lung cancer Mother    Asthma Mother    Peripheral Artery Disease Mother        stents in legs   Diabetes Father    Hypertension Father    Hypertension Brother    Heart failure Maternal Aunt    Breast cancer Maternal Aunt        great   Crohn's disease Maternal Aunt    Heart disease Maternal Aunt    Diabetes Maternal Grandmother    Cerebral aneurysm Maternal Grandmother    ADD / ADHD Son    Ovarian cancer Neg Hx     Social History   Tobacco Use   Smoking status: Every Day    Current packs/day: 0.25    Average packs/day: 0.5 packs/day for 31.3 years (15.6 ttl pk-yrs)    Types: Cigarettes    Start date: 07/03/1992   Smokeless tobacco: Never  Substance Use Topics   Alcohol use: Yes    Alcohol/week: 2.0 standard drinks of alcohol    Comment: occasional 1 drink every few weeks     Current Outpatient Medications:    albuterol (VENTOLIN HFA) 108 (90 Base) MCG/ACT inhaler, Inhale 2 puffs into the lungs every 6 (six) hours as needed for wheezing or shortness of breath., Disp: 8 g, Rfl: 0   aspirin EC 81 MG tablet, Take 81 mg by mouth daily. Swallow whole., Disp: , Rfl:    loratadine (CLARITIN) 10 MG tablet, Take 1 tablet (10 mg total) by mouth daily., Disp: 30 tablet, Rfl: 11   losartan-hydrochlorothiazide (HYZAAR) 100-12.5 MG tablet, Take 1 tablet by mouth once daily, Disp: 90 tablet, Rfl: 0   montelukast (SINGULAIR) 10 MG tablet, Take 1 tablet (10 mg total) by mouth at bedtime., Disp: 30 tablet, Rfl: 3   sulfamethoxazole-trimethoprim (BACTRIM) 400-80 MG tablet, Take 1 tablet  by mouth 2 (two) times daily., Disp: 6 tablet, Rfl: 0  Allergies  Allergen Reactions   Hepatitis B Virus Vaccines     Dysarthria     I personally reviewed active problem list, medication list, allergies, family history with the patient/caregiver today.   ROS  Ten systems reviewed and is negative except as mentioned in HPI    Objective  Vitals:   10/04/23 1505  BP: 124/76  Pulse: 85  Resp: 16  Temp: 97.6 F (36.4 C)  TempSrc: Oral  SpO2: 99%  Weight: 241 lb 11.2 oz (109.6 kg)  Height: 5\' 3"  (1.6 m)    Body mass index is 42.82 kg/m.  Physical Exam  Constitutional: Patient appears well-developed and well-nourished. Obese  No distress.  HEENT: head atraumatic, normocephalic, pupils equal and reactive to light, neck supple, throat within normal limits Cardiovascular: Normal rate, regular  rhythm and normal heart sounds.  No murmur heard. No BLE edema. Pulmonary/Chest: Effort normal and breath sounds normal. No respiratory distress. Abdominal: Soft.  There is no tenderness. Psychiatric: Patient has a normal mood and affect. behavior is normal. Judgment and thought content normal.    PHQ2/9:    10/04/2023    3:01 PM 08/30/2023    2:10 PM 07/02/2023    1:46 PM 02/12/2023    1:52 PM 11/07/2022    2:47 PM  Depression screen PHQ 2/9  Decreased Interest 0 0 0 0 0  Down, Depressed, Hopeless 0 0 0 0 0  PHQ - 2 Score 0 0 0 0 0  Altered sleeping 0    0  Tired, decreased energy 0    0  Change in appetite 0    0  Feeling bad or failure about yourself  0    0  Trouble concentrating 0    0  Moving slowly or fidgety/restless 0    0  Suicidal thoughts 0    0  PHQ-9 Score 0    0  Difficult doing work/chores Not difficult at all        phq 9 is negative  Fall Risk:    10/04/2023    3:01 PM 07/02/2023    1:46 PM 02/12/2023    1:52 PM 11/07/2022    2:40 PM 06/25/2022   10:08 AM  Fall Risk   Falls in the past year? 0 0 0 0 1  Number falls in past yr: 0 0 0  1  Injury with  Fall? 0 0 0  1  Risk for fall due to : No Fall Risks No Fall Risks No Fall Risks No Fall Risks No Fall Risks  Follow up Falls prevention discussed;Education provided;Falls evaluation completed Falls prevention discussed Falls prevention discussed Falls prevention discussed Falls prevention discussed     Assessment & Plan  1. Aneurysm of ascending aorta without rupture (HCC) (Primary)  Keep visits with vascular surgeon   2. Large thymus (HCC)  - Ambulatory referral to Endocrinology - MR CHEST MEDIASTINUM LIMITED; Future  3. Morbid obesity (HCC)  Discussed with the patient the risk posed by an increased BMI. Discussed importance of portion control, calorie counting and at least 150 minutes of physical activity weekly. Avoid sweet beverages and drink more water. Eat at least 6 servings of fruit and vegetables daily    4. OSA (obstructive sleep apnea)  Start CPAP  5. Hypertension, benign  - losartan-hydrochlorothiazide (HYZAAR) 100-12.5 MG tablet; Take 1 tablet by mouth daily.  Dispense: 90 tablet; Refill: 1  6. Seasonal allergies  stable  7. Thyroid nodule  - Ambulatory referral to Endocrinology - MR CHEST MEDIASTINUM LIMITED; Future  8. Vitamin D deficiency  - Cholecalciferol (VITAMIN D3) 50 MCG (2000 UT) capsule; Take 1 capsule (2,000 Units total) by mouth daily.  Dispense: 100 capsule; Refill: 1  9. Pre-diabetes  - metFORMIN (GLUCOPHAGE-XR) 750 MG 24 hr tablet; Take 1 tablet (750 mg total) by mouth daily with breakfast.  Dispense: 90 tablet; Refill: 1  10. Leukocytosis, unspecified type  Seen by Dr. Cathie Hoops in the past , we will hold off on referral for now and focus on seeing Endo at this time  11. Hypercalcemia  - PTH, intact and calcium

## 2023-10-05 LAB — PTH, INTACT AND CALCIUM
Calcium: 9.7 mg/dL (ref 8.6–10.4)
PTH: 24 pg/mL (ref 16–77)

## 2023-10-07 ENCOUNTER — Encounter: Payer: Self-pay | Admitting: Family Medicine

## 2023-10-15 ENCOUNTER — Other Ambulatory Visit: Payer: Self-pay | Admitting: Family Medicine

## 2023-10-15 DIAGNOSIS — J9859 Other diseases of mediastinum, not elsewhere classified: Secondary | ICD-10-CM

## 2023-10-16 ENCOUNTER — Other Ambulatory Visit: Payer: Self-pay

## 2023-10-16 ENCOUNTER — Other Ambulatory Visit: Payer: Self-pay | Admitting: Family Medicine

## 2023-10-16 DIAGNOSIS — J9859 Other diseases of mediastinum, not elsewhere classified: Secondary | ICD-10-CM

## 2023-10-17 ENCOUNTER — Telehealth (INDEPENDENT_AMBULATORY_CARE_PROVIDER_SITE_OTHER): Payer: 59 | Admitting: Family Medicine

## 2023-10-17 ENCOUNTER — Encounter: Payer: Self-pay | Admitting: Family Medicine

## 2023-10-17 VITALS — HR 74

## 2023-10-17 DIAGNOSIS — B349 Viral infection, unspecified: Secondary | ICD-10-CM | POA: Diagnosis not present

## 2023-10-17 NOTE — Progress Notes (Signed)
Name: Jill Davidson   MRN: [id number]    DOB: May 14, 1971   Date:10/18/2023       Progress Note  Subjective  Chief Complaint  Chief Complaint  Patient presents with   Sore Throat   Generalized Body Aches    No smell/taste, COVID test on Tuesday-Negative, sx started Monday evening   Headache   Fatigue   Cough    I connected with  [name]  on 10/18/23 at 11:00 AM EST by a video enabled telemedicine application and verified that I am speaking with the correct person using two identifiers.  I discussed the limitations of evaluation and management by telemedicine and the availability of in person appointments. The patient expressed understanding and agreed to proceed with a virtual visit  Staff also discussed with the patient that there may be a patient responsible charge related to this service. Patient Location: at home  Provider Location: [facility] Additional Individuals present: alone  Discussed the use of AI scribe software for clinical note transcription with the patient, who gave verbal consent to proceed.  History of Present Illness   The patient presents with symptoms of a viral illness, including body aches, headache, loss of taste and smell, and fatigue.  Symptoms began on Monday evening and persisted into Tuesday morning, including body aches, headache, loss of appetite, loss of smell and taste, sore throat, and cold chills. She has not checked for a fever but experiences significant fatigue, sleeping a lot, and feeling exhausted after minimal activity. She has a cough but no shortness of breath.  She took a home COVID-19 test, which was negative, but is concerned about potential exposure to COVID-19 due to contact with a dialysis patient whose son tested positive. She has not worked since Monday due to her symptoms.  She received the first two doses of the COVID-19 vaccine but has not received any boosters. She also received the flu vaccine but is unsure if she could still  contract the flu despite vaccination.  She lives with her granddaughter and son, who are present at night.         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   OSA (obstructive sleep apnea) 10/04/2023   Seasonal allergies 10/04/2023   Thyroid nodule 10/04/2023   Vitamin D deficiency 10/04/2023   Pre-diabetes 10/04/2023   Leukocytosis 10/04/2023   Hypercalcemia 10/04/2023   Dyslipidemia 08/14/2023   Atypical chest pain 07/11/2023   Polyp of ascending colon 11/26/2022   Rectal polyp 11/26/2022   Leg cramp 09/21/2022   Aneurysm of ascending aorta without rupture (HCC) 04/10/2022   Palpitations 04/10/2022   Abnormal CT scan of lung 04/10/2022   Tobacco user 07/24/2018   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 Swallow whole., Disp: , Rfl:    Cholecalciferol (VITAMIN D3) 50 MCG (2000 UT) capsule, Take 1 capsule (2,000 Units total) by mouth daily., Disp: 100 capsule, Rfl: 1   loratadine (CLARITIN) 10 MG tablet, Take 1 tablet (10 mg total) by mouth daily., Disp: 30 tablet, Rfl: 11   losartan-hydrochlorothiazide (HYZAAR) 100-12.5 MG tablet, Take 1 tablet by mouth daily., Disp: 90 tablet, Rfl: 1   metFORMIN (GLUCOPHAGE-XR) 750 MG 24 hr tablet, Take 1 tablet (750 mg total)  by mouth daily with breakfast., Disp: 90 tablet, Rfl: 1  Allergies  Allergen Reactions   Hepatitis B Virus Vaccines     Dysarthria     I personally reviewed active problem list, medication list, allergies with the patient/caregiver today.  ROS  Ten systems reviewed and is negative except as mentioned in HPI    Objective  Virtual encounter, vitals not obtained.  There is no height or weight on file to calculate BMI.  Nursing Note and Vital Signs reviewed.  Physical Exam  Awake, alert and oriented  Assessment & Plan      Viral Illness/Suspected COVID-19 Despite a negative home test, symptoms of fatigue, loss of taste and smell, chills, and a sore throat are suggestive of COVID-19. Recent exposure to a confirmed COVID-19 case further supports this suspicion. -Perform in-office COVID-19 swab for confirmation. -Check  pulse  oxygen saturation. -Advise to stay hydrated, consider over-the-counter cold and cough medicines, and take zinc and turmeric for immune support. -Advise to quarantine for five days and wear a mask around others in the household. -Provide work excuse for one week.  General Health Maintenance Received first two doses of COVID-19 vaccine. -Continue to follow COVID-19 vaccination guidelines as recommended.         -Red flags and when to present for emergency care or RTC including fever >101.95F, chest pain, shortness of breath, new/worsening/un-resolving symptoms,  reviewed with patient at time of visit. Follow up and care instructions discussed and provided in AVS. - I discussed the assessment and treatment plan with the patient. The patient was provided an opportunity to ask questions and all were answered. The patient agreed with the plan and demonstrated an understanding of the instructions.  I provided 15  minutes of non-face-to-face time during this encounter.  Ruel Favors, MD

## 2023-10-19 LAB — NOVEL CORONAVIRUS, NAA: SARS-CoV-2, NAA: NOT DETECTED

## 2023-10-21 ENCOUNTER — Encounter: Payer: Self-pay | Admitting: Family Medicine

## 2023-11-14 DIAGNOSIS — G4733 Obstructive sleep apnea (adult) (pediatric): Secondary | ICD-10-CM | POA: Diagnosis not present

## 2023-11-18 ENCOUNTER — Encounter: Payer: Self-pay | Admitting: Family Medicine

## 2023-11-18 ENCOUNTER — Ambulatory Visit: Payer: Self-pay | Admitting: Family Medicine

## 2023-11-18 ENCOUNTER — Ambulatory Visit (INDEPENDENT_AMBULATORY_CARE_PROVIDER_SITE_OTHER): Admitting: Family Medicine

## 2023-11-18 VITALS — BP 104/72 | HR 108 | Temp 97.8°F | Resp 16 | Ht 63.0 in | Wt 231.6 lb

## 2023-11-18 DIAGNOSIS — R059 Cough, unspecified: Secondary | ICD-10-CM

## 2023-11-18 DIAGNOSIS — Z720 Tobacco use: Secondary | ICD-10-CM

## 2023-11-18 DIAGNOSIS — J069 Acute upper respiratory infection, unspecified: Secondary | ICD-10-CM

## 2023-11-18 LAB — POC COVID19/FLU A&B COMBO
Covid Antigen, POC: NEGATIVE
Influenza A Antigen, POC: NEGATIVE
Influenza B Antigen, POC: NEGATIVE

## 2023-11-18 MED ORDER — AIRSUPRA 90-80 MCG/ACT IN AERO: 2.0000 | INHALATION_SPRAY | Freq: Four times a day (QID) | RESPIRATORY_TRACT | 0 refills | Status: AC | PRN

## 2023-11-18 MED ORDER — BENZONATATE 100 MG PO CAPS
100.0000 mg | ORAL_CAPSULE | Freq: Three times a day (TID) | ORAL | 0 refills | Status: DC | PRN
Start: 2023-11-18 — End: 2024-01-06

## 2023-11-18 NOTE — Telephone Encounter (Signed)
 Chief Complaint: chest pain Symptoms: CP, back pain, cough, green/yellow mucus, head and nose congestion, diarrhea, loss of appetite, fatigue Frequency: since Friday Pertinent Negatives: Patient denies fever, vomiting, chest pain NOT with coughing Disposition: [] ED /[] Urgent Care (no appt availability in office) / [x] Appointment(In office/virtual)/ []  [facility]/ [] Home Care/ [] Refused Recommended Disposition /[] [facility]/ []  Follow-up with PCP Additional Notes: Pt reports chest pain and upper R sided back pain only with coughing. Pt reports coming down with a virus on Friday. States her whole house is sick and she was the last to catch it. Endorses cough with green/yellow mucus, head and nose congestion, diarrhea, fatigue, loss of appetite. Denies chest pain not with coughing. States CP dissipates once coughing ends. Per protocol pt scheduled today in office at 1040. RN advised pt if she becomes SOB or develops CP not with coughing she needs to call 911 to which she verbalized understanding.    Copied from CRM [id number]. Topic: Clinical - Red Word Triage >> Nov 18, 2023  9:54 AM Jill Davidson wrote: [facility] t hat prompted transfer to Nurse Triage: chest pain /upper back pain mostly right side back Reason for Disposition  [1] Chest pain(s) lasting a few seconds from coughing AND [2] persists > 3 days  Answer Assessment - Initial Assessment Questions 1. LOCATION: "Where does it hurt?"       Chest pain and upper R back pain 2. RADIATION: "Does the pain go anywhere else?" (e.g., into neck, jaw, arms, back)     Upper back, R side 3. ONSET: "When did the chest pain begin?" (Minutes, hours or days)      Virus since Friday 4. PATTERN: "Does the pain come and go, or has it been constant since it started?"  "Does it get worse with exertion?"      With coughing 5. DURATION: "How long does it last" (e.g., seconds, minutes, hours)     Dissipates after coughing  6. SEVERITY: "How bad  is the pain?"  (e.g., Scale 1-10; mild, moderate, or severe)    - MILD (1-3): doesn't interfere with normal activities     - MODERATE (4-7): interferes with normal activities or awakens from sleep    - SEVERE (8-10): excruciating pain, unable to do any normal activities       8/10 with coughing (states it was a 10 over the weekend but got better) 7. CARDIAC RISK FACTORS: "Do you have any history of heart problems or risk factors for heart disease?" (e.g., angina, prior heart attack; diabetes, high blood pressure, high cholesterol, smoker, or strong family history of heart disease)     HTN, aneurysm of ascending aorta  8. PULMONARY RISK FACTORS: "Do you have any history of lung disease?"  (e.g., blood clots in lung, asthma, emphysema, birth control pills)     OSA 9. CAUSE: "What do you think is causing the chest pain?"     Virus, whole family sick 10. OTHER SYMPTOMS: "Do you have any other symptoms?" (e.g., dizziness, nausea, vomiting, sweating, fever, difficulty breathing, cough)       CP "up under the breast" and upper back pain only with coughing, "when my back is hurting the slightest movement makes it cramp or something", severe cough, green/yellow mucus, no fever, some diarrhea "not real bad", taking Delsym cough syrup is working but not getting rid of cough completely, been wearing CPAP mask (sleep apnea), head and nose stopped up, no appetite, no energy  Protocols used: Chest Pain-A-AH

## 2023-11-18 NOTE — Progress Notes (Signed)
 Name: Jill Davidson   MRN: [id number]    DOB: [birth year]   Date:11/18/2023       Progress Note  Subjective  Chief Complaint  Chief Complaint  Patient presents with   Chest Pain    Underneath breast area, mainly when coughing only, since Friday   Cough   Nasal Congestion   Back Pain    upper R sided back pain only with coughing. Pt reports coming down with a virus on Friday. States her whole house is sick and she was the last to catch it. Endorses cough with green/yellow mucus, head and nose congestion, diarrhea, fatigue, loss of appetite.     Discussed the use of AI scribe software for clinical note transcription with the patient, who gave verbal consent to proceed.  History of Present Illness   Jill Davidson is a 53 year old female who presents with cough, fatigue, and congestion.  She has been experiencing symptoms since Friday, beginning with fatigue and a cough. Initially, she was able to work but felt exhausted by the end of the day, with her cough worsening. Over the weekend, her symptoms progressed to include rhinorrhea, congestion, and persistent fatigue. She also experiences cold chills but is unsure about having a fever. No sore throat, but she mentions xerostomia and throat irritation, which she attributes to CPAP use. Her appetite is poor, having only eaten an orange on Saturday morning.  Her family, including her son, grandchildren, and grandbaby, were sick the previous week, although none sought medical attention. Her symptoms began after her family members were ill, suggesting a possible viral transmission within the household.  She has a history of smoking but has significantly reduced her cigarette intake since becoming ill, smoking only three cigarettes since Friday. She describes her cough as productive and notes that smoking exacerbates it, causing chest pain when coughing. She has been using [place] Desitin, a 12-hour cough suppressant, which provides some relief but does  not completely alleviate her symptoms. She has a history of using an albuterol inhaler but has not used it recently. She reports feeling a 'rattle' in her chest when breathing but denies wheezing.        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   OSA (obstructive sleep apnea) 10/04/2023   Seasonal allergies 10/04/2023   Thyroid nodule 10/04/2023   Vitamin D deficiency 10/04/2023   Pre-diabetes 10/04/2023   Leukocytosis 10/04/2023   Hypercalcemia 10/04/2023   Dyslipidemia 08/14/2023   Atypical chest pain 07/11/2023   Polyp of ascending colon 11/26/2022   Rectal polyp 11/26/2022   Leg cramp 09/21/2022   Aneurysm of ascending aorta without rupture (HCC) 04/10/2022   Palpitations 04/10/2022   Abnormal CT scan of lung 04/10/2022   Tobacco user 07/24/2018   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 Swallow whole., Disp: , Rfl:    Cholecalciferol (VITAMIN D3) 50 MCG (2000 UT) capsule, Take 1 capsule (2,000 Units total) by mouth daily., Disp: 100 capsule, Rfl: 1   loratadine (CLARITIN) 10 MG tablet, Take 1 tablet (10 mg total) by mouth daily., Disp: 30 tablet, Rfl: 11   losartan-hydrochlorothiazide (HYZAAR) 100-12.5 MG tablet, Take 1 tablet by mouth daily., Disp: 90 tablet,  Rfl: 1   metFORMIN (GLUCOPHAGE-XR) 750 MG 24 hr tablet, Take 1 tablet (750 mg total) by mouth daily with breakfast., Disp: 90 tablet, Rfl: 1  Allergies  Allergen Reactions   Hepatitis B Virus Vaccines     Dysarthria     ROS  Ten systems reviewed and is negative except as mentioned in HPI    Objective  Vitals:   11/18/23 1048  BP: 104/72  Pulse: (!) 108  Resp: 16  Temp: 97.8 F (36.6 C)  TempSrc: Oral  SpO2: 96%  Weight: 231 lb 9.6 oz (105.1 kg)  Height: 5\' 3"  (1.6 m)    Body mass index is 41.03 kg/m.  Physical Exam  Constitutional: Patient appears well-developed and well-nourished. Obese  No distress.  HEENT: head atraumatic, normocephalic, pupils equal and reactive to light, ears normal TM, neck supple, throat within normal limits Cardiovascular: Normal rate, regular rhythm and normal heart sounds.  No murmur heard. No BLE edema. Pulmonary/Chest: Effort normal and breath sounds normal. No respiratory distress. Abdominal: Soft.  There is no tenderness. Psychiatric: Patient has a normal mood and affect. behavior is normal. Judgment and thought content normal.    Assessment and Plan    Upper Respiratory Infection Likely viral etiology given negative flu and COVID tests. Symptoms include cough, fatigue, congestion, and chills. No improvement with Desitin. -Start Airsupra inhaler for inflammation. -Start Tessalon Perles for daytime cough. -Consider Mucinex DM for congestion. -If cough worsens at night, consider cough syrup. -Encourage rest, fluids, and symptomatic care. -If symptoms worsen or do not improve in a few days, consider possibility of bacterial infection and reassess.  Tobacco Use Decreased smoking to 3 cigarettes since onset of illness. Regular smoker's cough exacerbated by current illness. -Encourage continued reduction in smoking.  Work Excuse Patient feeling unwell and wishes to avoid spreading illness. -Provide work excuse for two days. If unable  to return to work on Wednesday, reassess.

## 2023-11-19 ENCOUNTER — Telehealth: Payer: Self-pay | Admitting: Family Medicine

## 2023-11-19 NOTE — Telephone Encounter (Signed)
 Spoke to patient advised to do the voucher or lok up on google for a coupon, states she will. Will come back tomorrow for follow up on sickness.

## 2023-11-19 NOTE — Telephone Encounter (Signed)
 Copied from CRM [id number]. Topic: Clinical - Prescription Issue >> Nov 19, 2023  9:57 AM Dondra Prader A wrote: Reason for CRM: Patient states that she went to the pharmacy to get her prescription: Albuterol-Budesonide (AIRSUPRA) 90-80 MCG/ACT AERO and it is not covered by her insurance. Patient is wanting to see if something else could be called in for her. She is also wanting to see if her note for work could be extended to go back to work on Monday, she is still not feeling good.   Best contact number: [phone]

## 2023-11-20 ENCOUNTER — Encounter: Payer: Self-pay | Admitting: Hematology and Oncology

## 2023-11-20 ENCOUNTER — Ambulatory Visit
Admission: RE | Admit: 2023-11-20 | Discharge: 2023-11-20 | Disposition: A | Discharge status: Home / Self Care | Attending: Family Medicine | Admitting: Family Medicine

## 2023-11-20 ENCOUNTER — Ambulatory Visit
Admission: RE | Admit: 2023-11-20 | Discharge: 2023-11-20 | Disposition: A | Discharge status: Home / Self Care | Source: Ambulatory Visit | Attending: Family Medicine | Admitting: Family Medicine

## 2023-11-20 ENCOUNTER — Encounter: Payer: Self-pay | Admitting: Family Medicine

## 2023-11-20 ENCOUNTER — Ambulatory Visit (INDEPENDENT_AMBULATORY_CARE_PROVIDER_SITE_OTHER): Admitting: Family Medicine

## 2023-11-20 VITALS — BP 98/64 | HR 87 | Temp 98.2°F | Resp 16 | Ht 63.0 in | Wt 231.6 lb

## 2023-11-20 DIAGNOSIS — I959 Hypotension, unspecified: Secondary | ICD-10-CM | POA: Diagnosis not present

## 2023-11-20 DIAGNOSIS — R059 Cough, unspecified: Secondary | ICD-10-CM

## 2023-11-20 DIAGNOSIS — R630 Anorexia: Secondary | ICD-10-CM

## 2023-11-20 NOTE — Progress Notes (Signed)
 Name: Jill Davidson   MRN: [id number]    DOB: 08/10/1971   Date:11/20/2023       Progress Note  Subjective  Chief Complaint  Chief Complaint  Patient presents with   Follow-up    Sx not improved, started vomiting yesterday    HPI   URI: she came in today because she is still not feeling well. She was seen on March 3 rd and had negative flu and COVID test, she was given Airupra but unable to fill it ( we will give her a voucher today) tessalon perles but she continues to feel tired, lack of appetite and vomited yesterday. She states today able to keep some food down and has been drinking water. BP is low today, she has a history of HTN and takes losartan hydrochlorothiazide. She denies dizziness . Still has a headache. 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   OSA (obstructive sleep apnea) 10/04/2023   Seasonal allergies 10/04/2023   Thyroid nodule 10/04/2023   Vitamin D deficiency 10/04/2023   Pre-diabetes 10/04/2023   Leukocytosis 10/04/2023   Hypercalcemia 10/04/2023   Dyslipidemia 08/14/2023   Atypical chest pain 07/11/2023   Polyp of ascending colon 11/26/2022   Rectal polyp 11/26/2022   Leg cramp 09/21/2022   Aneurysm of ascending aorta without rupture (HCC) 04/10/2022   Palpitations 04/10/2022   Abnormal CT scan of lung 04/10/2022   Tobacco user 07/24/2018   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) 06/05/2018   Vasomotor symptoms due to menopause 05/06/2018   Hypertension, benign 07/25/2016   History of iron deficiency anemia 01/25/2015    Social History   Tobacco Use   Smoking status: Every Day    Current packs/day: 0.25    Average packs/day: 0.5 packs/day for 31.4 years (15.6 ttl pk-yrs)    Types: Cigarettes    Start date: 07/03/1992   Smokeless tobacco: Never  Substance Use Topics   Alcohol use: Yes     Alcohol/week: 2.0 standard drinks of alcohol    Comment: occasional 1 drink every few weeks     Current Outpatient Medications:    Albuterol-Budesonide (AIRSUPRA) 90-80 MCG/ACT AERO, Inhale 2 puffs into the lungs 4 (four) times daily as needed., Disp: 107 g, Rfl: 0   aspirin EC 81 MG tablet, Take 81 mg by mouth daily. Swallow whole., Disp: , Rfl:    benzonatate (TESSALON) 100 MG capsule, Take 1-2 capsules (100-200 mg total) by mouth 3 (three) times daily as needed for cough., Disp: 40 capsule, Rfl: 0   Cholecalciferol (VITAMIN D3) 50 MCG (2000 UT) capsule, Take 1 capsule (2,000 Units total) by mouth daily., Disp: 100 capsule, Rfl: 1   loratadine (CLARITIN) 10 MG tablet, Take 1 tablet (10 mg total) by mouth daily., Disp: 30 tablet, Rfl: 11   losartan-hydrochlorothiazide (HYZAAR) 100-12.5 MG tablet, Take 1 tablet by mouth daily., Disp: 90 tablet, Rfl: 1   metFORMIN (GLUCOPHAGE-XR) 750 MG 24 hr tablet, Take 1 tablet (750 mg total) by mouth daily with breakfast., Disp: 90 tablet, Rfl: 1  Allergies  Allergen Reactions   Hepatitis B Virus Vaccines     Dysarthria     ROS  Ten systems reviewed and is negative except as mentioned in HPI    Objective  Vitals:   11/20/23 1508  BP: 98/64  Pulse: 87  Resp: 16  Temp: 98.2 F (36.8 C)  TempSrc: Oral  SpO2: 96%  Weight: 231 lb 9.6 oz (105.1 kg)  Height: 5\' 3"  (1.6 m)    Body mass index is 41.03 kg/m.    Physical Exam  Constitutional: Patient appears well-developed and well-nourished. Obese  No distress.  HEENT: head atraumatic, normocephalic, pupils equal and reactive to light, ears normal TM, neck supple, throat within normal limits Cardiovascular: Normal rate, regular rhythm and normal heart sounds.  No murmur heard. No BLE edema. Pulmonary/Chest: Effort normal and breath sounds normal. No respiratory distress. Abdominal: Soft.  There is no tenderness. Psychiatric: Patient has a normal mood and affect. behavior is normal.  Judgment and thought content normal.   Recent Results (from the past 2160 hours)  Urine Culture     Status: Abnormal   Collection Time: 08/30/23  2:38 PM   Specimen: Urine  Result Value Ref Range   MICRO NUMBER: [id number]    SPECIMEN QUALITY: Adequate    Sample Source URINE    STATUS: FINAL    ISOLATE 1: Klebsiella pneumoniae (A)     Comment: 50,000-100,000 CFU/mL of Klebsiella pneumoniae      Susceptibility   Klebsiella pneumoniae - URINE CULTURE, REFLEX    AMOX/CLAVULANIC <=2 Sensitive     AMPICILLIN >=32 Resistant     AMPICILLIN/SULBACTAM 4 Sensitive     CEFAZOLIN* <=4 Not Reportable      * For infections other than uncomplicated UTI caused by E. coli, K. pneumoniae or P. mirabilis: Cefazolin is resistant if MIC > or = 8 mcg/mL. (Distinguishing susceptible versus intermediate for isolates with MIC < or = 4 mcg/mL requires additional testing.) For uncomplicated UTI caused by E. coli, K. pneumoniae or P. mirabilis: Cefazolin is susceptible if MIC <32 mcg/mL and predicts susceptible to the oral agents cefaclor, cefdinir, cefpodoxime, cefprozil, cefuroxime, cephalexin and loracarbef.     CEFTAZIDIME <=1 Sensitive     CEFEPIME <=1 Sensitive     CEFTRIAXONE <=1 Sensitive     CIPROFLOXACIN <=0.25 Sensitive     LEVOFLOXACIN <=0.12 Sensitive     GENTAMICIN <=1 Sensitive     IMIPENEM <=0.25 Sensitive     NITROFURANTOIN 64 Intermediate     PIP/TAZO <=4 Sensitive     TOBRAMYCIN <=1 Sensitive     TRIMETH/SULFA* <=20 Sensitive      * For infections other than uncomplicated UTI caused by E. coli, K. pneumoniae or P. mirabilis: Cefazolin is resistant if MIC > or = 8 mcg/mL. (Distinguishing susceptible versus intermediate for isolates with MIC < or = 4 mcg/mL requires additional testing.) For uncomplicated UTI caused by E. coli, K. pneumoniae or P. mirabilis: Cefazolin is susceptible if MIC <32 mcg/mL and predicts susceptible to the oral agents cefaclor, cefdinir, cefpodoxime,  cefprozil, cefuroxime, cephalexin and loracarbef. Legend: S = Susceptible  I = Intermediate R = Resistant  NS = Not susceptible SDD = Susceptible Dose Dependent * = Not Tested  NR = Not Reported **NN = See Therapy Comments   CBC with Differential/Platelet     Status: Abnormal   Collection Time: 08/30/23  2:38 PM  Result Value Ref Range   WBC 14.0 (H) 3.8 - 10.8 Thousand/uL   RBC 5.12 (H) 3.80 - 5.10 Million/uL   Hemoglobin 15.1 11.7 - 15.5 g/dL   HCT 54.0 98.1 - 19.1 %   MCV 87.7 80.0 - 100.0 fL   MCH 29.5 27.0 - 33.0 pg   MCHC 33.6 32.0 - 36.0 g/dL    Comment: For adults,  a slight decrease in the calculated MCHC value (in the range of 30 to 32 g/dL) is most likely not clinically significant; however, it should be interpreted with caution in correlation with other red cell parameters and the patient's clinical condition.    RDW 12.6 11.0 - 15.0 %   Platelets 418 (H) 140 - 400 Thousand/uL   MPV 10.1 7.5 - 12.5 fL   Neutro Abs 7,000 1,500 - 7,800 cells/uL   Absolute Lymphocytes 5,992 (H) 850 - 3,900 cells/uL   Absolute Monocytes 812 200 - 950 cells/uL   Eosinophils Absolute 126 15 - 500 cells/uL   Basophils Absolute 70 0 - 200 cells/uL   Neutrophils Relative % 50 %   Total Lymphocyte 42.8 %   Monocytes Relative 5.8 %   Eosinophils Relative 0.9 %   Basophils Relative 0.5 %  COMPLETE METABOLIC PANEL WITH GFR     Status: Abnormal   Collection Time: 08/30/23  2:38 PM  Result Value Ref Range   Glucose, Bld 88 65 - 99 mg/dL    Comment: .            Fasting reference interval .    BUN 13 7 - 25 mg/dL   Creat 9.14 7.82 - 9.56 mg/dL   eGFR 86 > OR = 60 OZ/HYQ/6.57Q4   BUN/Creatinine Ratio SEE NOTE: 6 - 22 (calc)    Comment:    Not Reported: BUN and Creatinine are within    reference range. .    Sodium 140 135 - 146 mmol/L   Potassium 3.8 3.5 - 5.3 mmol/L   Chloride 102 98 - 110 mmol/L   CO2 29 20 - 32 mmol/L   Calcium 10.5 (H) 8.6 - 10.4 mg/dL   Total Protein 7.2 6.1  - 8.1 g/dL   Albumin 4.3 3.6 - 5.1 g/dL   Globulin 2.9 1.9 - 3.7 g/dL (calc)   AG Ratio 1.5 1.0 - 2.5 (calc)   Total Bilirubin 0.4 0.2 - 1.2 mg/dL   Alkaline phosphatase (APISO) 66 37 - 153 U/L   AST 14 10 - 35 U/L   ALT 11 6 - 29 U/L  Lipid panel     Status: Abnormal   Collection Time: 08/30/23  2:38 PM  Result Value Ref Range   Cholesterol 181 <200 mg/dL   HDL 37 (L) > OR = 50 mg/dL   Triglycerides 696 (H) <150 mg/dL   LDL Cholesterol (Calc) 116 (H) mg/dL (calc)    Comment: Reference range: <100 . Desirable range <100 mg/dL for primary prevention;   <70 mg/dL for patients with CHD or diabetic patients  with > or = 2 CHD risk factors. Marland Kitchen LDL-C is now calculated using the Martin-Hopkins  calculation, which is a validated novel method providing  better accuracy than the Friedewald equation in the  estimation of LDL-C.  Horald Pollen et al. Lenox Ahr. 2952;841(32): 2061-2068  (http://education.[facility].com/faq/FAQ164)    Total CHOL/HDL Ratio 4.9 <5.0 (calc)   Non-HDL Cholesterol (Calc) 144 (H) <130 mg/dL (calc)    Comment: For patients with diabetes plus 1 major ASCVD risk  factor, treating to a non-HDL-C goal of <100 mg/dL  (LDL-C of <44 mg/dL) is considered a therapeutic  option.   Hemoglobin A1c     Status: Abnormal   Collection Time: 08/30/23  2:38 PM  Result Value Ref Range   Hgb A1c MFr Bld 6.1 (H) <5.7 % of total Hgb    Comment: For someone without known diabetes, a hemoglobin  A1c value between  5.7% and 6.4% is consistent with prediabetes and should be confirmed with a  follow-up test. . For someone with known diabetes, a value <7% indicates that their diabetes is well controlled. A1c targets should be individualized based on duration of diabetes, age, comorbid conditions, and other considerations. . This assay result is consistent with an increased risk of diabetes. . Currently, no consensus exists regarding use of hemoglobin A1c for diagnosis of diabetes  for children. .    Mean Plasma Glucose 128 mg/dL   eAG (mmol/L) 7.1 mmol/L  RPR     Status: None   Collection Time: 08/30/23  2:38 PM  Result Value Ref Range   RPR Ser Ql NON-REACTIVE NON-REACTIVE    Comment: . No laboratory evidence of syphilis. If recent exposure is suspected, submit a new sample in 2-4 weeks. Marland Kitchen   HIV Antibody (routine testing w rflx)     Status: None   Collection Time: 08/30/23  2:38 PM  Result Value Ref Range   HIV 1&2 Ab, 4th Generation NON-REACTIVE NON-REACTIVE    Comment: HIV-1 antigen and HIV-1/HIV-2 antibodies were not detected. There is no laboratory evidence of HIV infection. Marland Kitchen PLEASE NOTE: This information has been disclosed to you from records whose confidentiality may be protected by state law.  If your state requires such protection, then the state law prohibits you from making any further disclosure of the information without the specific written consent of the person to whom it pertains, or as otherwise permitted by law. A general authorization for the release of medical or other information is NOT sufficient for this purpose. . For additional information please refer to http://education.[facility].com/faq/FAQ106 (This link is being provided for informational/ educational purposes only.) . Marland Kitchen The performance of this assay has not been clinically validated in patients less than 84 years old. Marland Kitchen   VITAMIN D 25 Hydroxy (Vit-D Deficiency, Fractures)     Status: Abnormal   Collection Time: 08/30/23  2:38 PM  Result Value Ref Range   Vit D, 25-Hydroxy 17 (L) 30 - 100 ng/mL    Comment: Vitamin D Status         25-OH Vitamin D: . Deficiency:                    <20 ng/mL Insufficiency:             20 - 29 ng/mL Optimal:                 > or = 30 ng/mL . For 25-OH Vitamin D testing on patients on  D2-supplementation and patients for whom quantitation  of D2 and D3 fractions is required, the QuestAssureD(TM) 25-OH VIT D, (D2,D3),  LC/MS/MS is recommended: order  code 96295 (patients >37yrs). . See Note 1 . Note 1 . For additional information, please refer to  http://education.[facility].com/faq/FAQ199  (This link is being provided for informational/ educational purposes only.)   B12 and Folate Panel     Status: None   Collection Time: 08/30/23  2:38 PM  Result Value Ref Range   Vitamin B-12 455 200 - 1,100 pg/mL   Folate 5.8 ng/mL    Comment:                            Reference Range                            Low:           <  3.4                            Borderline:    3.4-5.4                            Normal:        >5.4 .   TSH     Status: None   Collection Time: 08/30/23  2:38 PM  Result Value Ref Range   TSH 1.16 mIU/L    Comment:           Reference Range .           > or = 20 Years  0.40-4.50 .                Pregnancy Ranges           First trimester    0.26-2.66           Second trimester   0.55-2.73           Third trimester    0.43-2.91   PTH, intact and calcium     Status: None   Collection Time: 10/04/23  3:45 PM  Result Value Ref Range   PTH 24 16 - 77 pg/mL    Comment: . Interpretive Guide    Intact PTH           Calcium ------------------    ----------           ------- Normal Parathyroid    Normal               Normal Hypoparathyroidism    Low or Low Normal    Low Hyperparathyroidism    Primary            Normal or High       High    Secondary          High                 Normal or Low    Tertiary           High                 High Non-Parathyroid    Hypercalcemia      Low or Low Normal    High .    Calcium 9.7 8.6 - 10.4 mg/dL  Novel Coronavirus, NAA ([facility])     Status: None   Collection Time: 10/17/23  1:59 PM   Specimen: Nasopharyngeal(NP) swabs in vial transport medium   Nasopharynge  Previous  Result Value Ref Range   SARS-CoV-2, NAA Not Detected Not Detected    Comment: This nucleic acid amplification test was developed and its  performance characteristics determined by [employer]. Nucleic acid amplification tests include RT-PCR and TMA. This test has not been FDA cleared or approved. This test has been authorized by FDA under an Emergency Use Authorization (EUA). This test is only authorized for the duration of time the declaration that circumstances exist justifying the authorization of the emergency use of in vitro diagnostic tests for detection of SARS-CoV-2 virus and/or diagnosis of COVID-19 infection under section 564(b)(1) of the Act, 21 U.S.C. 098JXB-1(Y) (1), unless the authorization is terminated or revoked sooner. When diagnostic testing is negative, the possibility of a false negative result should be considered in the context of a patient's recent exposures and the presence of clinical signs  and symptoms consistent with COVID-19. An individual without symptoms of COVID-19 and who is not shedding SARS-CoV-2 virus wo uld expect to have a negative (not detected) result in this assay.   POC Covid19/Flu A&B Antigen     Status: None   Collection Time: 11/18/23 11:02 AM  Result Value Ref Range   Influenza A Antigen, POC Negative Negative   Influenza B Antigen, POC Negative Negative   Covid Antigen, POC Negative Negative     Assessment & Plan  1. Cough, unspecified type (Primary)  - CBC with Differential/Platelet - COMPLETE METABOLIC PANEL WITH GFR - DG Chest 2 View; Future Extend time off , go back on Monday   2. Lack of appetite  - CBC with Differential/Platelet - COMPLETE METABOLIC PANEL WITH GFR - DG Chest 2 View; Future  3. Hypotension, unspecified hypotension type  It may be secondary to dehydration versus sepsis, versus bp medication. Advised to cut bp medication in half and return tomorrow for bp check with CMA,we will also check labs  - CBC with Differential/Platelet - COMPLETE METABOLIC PANEL WITH GFR - DG Chest 2 View; Future

## 2023-11-21 ENCOUNTER — Other Ambulatory Visit: Payer: Self-pay | Admitting: Family Medicine

## 2023-11-21 ENCOUNTER — Encounter: Payer: Self-pay | Admitting: Family Medicine

## 2023-11-21 DIAGNOSIS — E876 Hypokalemia: Secondary | ICD-10-CM

## 2023-11-21 LAB — COMPLETE METABOLIC PANEL WITH GFR
AG Ratio: 1.4 (calc) (ref 1.0–2.5)
ALT: 12 U/L (ref 6–29)
AST: 15 U/L (ref 10–35)
Albumin: 4 g/dL (ref 3.6–5.1)
Alkaline phosphatase (APISO): 48 U/L (ref 37–153)
BUN: 13 mg/dL (ref 7–25)
CO2: 26 mmol/L (ref 20–32)
Calcium: 9.3 mg/dL (ref 8.6–10.4)
Chloride: 100 mmol/L (ref 98–110)
Creat: 0.73 mg/dL (ref 0.50–1.03)
Globulin: 2.8 g/dL (ref 1.9–3.7)
Glucose, Bld: 104 mg/dL — ABNORMAL HIGH (ref 65–99)
Potassium: 3.2 mmol/L — ABNORMAL LOW (ref 3.5–5.3)
Sodium: 139 mmol/L (ref 135–146)
Total Bilirubin: 0.4 mg/dL (ref 0.2–1.2)
Total Protein: 6.8 g/dL (ref 6.1–8.1)
eGFR: 100 mL/min/{1.73_m2} (ref >=60)

## 2023-11-21 LAB — CBC WITH DIFFERENTIAL/PLATELET
Absolute Lymphocytes: 5664 {cells}/uL — ABNORMAL HIGH (ref 850–3900)
Absolute Monocytes: 552 {cells}/uL (ref 200–950)
Basophils Absolute: 24 {cells}/uL (ref 0–200)
Basophils Relative: 0.2 %
Eosinophils Absolute: 48 {cells}/uL (ref 15–500)
Eosinophils Relative: 0.4 %
HCT: 42.2 % (ref 35.0–45.0)
Hemoglobin: 14.5 g/dL (ref 11.7–15.5)
MCH: 29.8 pg (ref 27.0–33.0)
MCHC: 34.4 g/dL (ref 32.0–36.0)
MCV: 86.8 fL (ref 80.0–100.0)
MPV: 10.5 fL (ref 7.5–12.5)
Monocytes Relative: 4.6 %
Neutro Abs: 5712 {cells}/uL (ref 1500–7800)
Neutrophils Relative %: 47.6 %
Platelets: 327 10*3/uL (ref 140–400)
RBC: 4.86 10*6/uL (ref 3.80–5.10)
RDW: 12.5 % (ref 11.0–15.0)
Total Lymphocyte: 47.2 %
WBC: 12 10*3/uL — ABNORMAL HIGH (ref 3.8–10.8)

## 2023-11-21 MED ORDER — POTASSIUM CHLORIDE CRYS ER 20 MEQ PO TBCR: 20.0000 meq | EXTENDED_RELEASE_TABLET | Freq: Every day | ORAL | 0 refills | Status: DC

## 2023-12-05 DIAGNOSIS — E041 Nontoxic single thyroid nodule: Secondary | ICD-10-CM | POA: Diagnosis not present

## 2023-12-11 DIAGNOSIS — E042 Nontoxic multinodular goiter: Secondary | ICD-10-CM | POA: Diagnosis not present

## 2023-12-12 DIAGNOSIS — G4733 Obstructive sleep apnea (adult) (pediatric): Secondary | ICD-10-CM | POA: Diagnosis not present

## 2023-12-18 ENCOUNTER — Other Ambulatory Visit: Payer: Self-pay | Admitting: Family Medicine

## 2023-12-18 DIAGNOSIS — Z1231 Encounter for screening mammogram for malignant neoplasm of breast: Secondary | ICD-10-CM

## 2023-12-20 ENCOUNTER — Telehealth: Payer: Self-pay

## 2023-12-20 NOTE — Telephone Encounter (Signed)
 Copied from CRM [id number]. Topic: Clinical - Request for Lab/Test Order >> Dec 20, 2023 10:03 AM Ivette P wrote: Reason for CRM: Pt was advised to call in and schedule an appt for a Breast Exam but was given number to [facility]. Pt is requesting to speak to Dr. Carlynn Purl Nurse about the exam.    Pt is requesting callback [phone]

## 2023-12-20 NOTE — Telephone Encounter (Signed)
 Spoke to patient and she states she called the number I provided for [facility] [phone]. She was told by who she spoke to they only schedule for [facility]. Its for the MRI order Dr.Sowles put in. Pt state she is not going out of [place] to get this done. If she can get referred somewhere else here in [place]. She will not call [facility] again due to what she was told previously. Please advice

## 2023-12-24 ENCOUNTER — Encounter: Payer: Self-pay | Admitting: Hematology and Oncology

## 2024-01-01 ENCOUNTER — Ambulatory Visit

## 2024-01-06 ENCOUNTER — Encounter: Payer: Self-pay | Admitting: Hematology and Oncology

## 2024-01-06 ENCOUNTER — Ambulatory Visit (INDEPENDENT_AMBULATORY_CARE_PROVIDER_SITE_OTHER): Payer: Self-pay | Admitting: Family Medicine

## 2024-01-06 ENCOUNTER — Other Ambulatory Visit: Payer: Self-pay | Admitting: Family Medicine

## 2024-01-06 ENCOUNTER — Telehealth: Payer: Self-pay | Admitting: Family Medicine

## 2024-01-06 ENCOUNTER — Encounter: Payer: Self-pay | Admitting: Family Medicine

## 2024-01-06 VITALS — BP 122/84 | HR 86 | Temp 98.3°F | Resp 18 | Ht 63.0 in | Wt 234.0 lb

## 2024-01-06 DIAGNOSIS — I10 Essential (primary) hypertension: Secondary | ICD-10-CM

## 2024-01-06 DIAGNOSIS — J301 Allergic rhinitis due to pollen: Secondary | ICD-10-CM | POA: Diagnosis not present

## 2024-01-06 DIAGNOSIS — G4733 Obstructive sleep apnea (adult) (pediatric): Secondary | ICD-10-CM

## 2024-01-06 DIAGNOSIS — J4520 Mild intermittent asthma, uncomplicated: Secondary | ICD-10-CM | POA: Diagnosis not present

## 2024-01-06 DIAGNOSIS — R7303 Prediabetes: Secondary | ICD-10-CM | POA: Diagnosis not present

## 2024-01-06 DIAGNOSIS — E320 Persistent hyperplasia of thymus: Secondary | ICD-10-CM | POA: Diagnosis not present

## 2024-01-06 MED ORDER — LOSARTAN POTASSIUM-HCTZ 50-12.5 MG PO TABS: 1.0000 | ORAL_TABLET | Freq: Every day | ORAL | 0 refills | Status: DC

## 2024-01-06 MED ORDER — FLUTICASONE PROPIONATE 50 MCG/ACT NA SUSP: 2.0000 | Freq: Every day | NASAL | 2 refills | Status: AC

## 2024-01-06 MED ORDER — WEGOVY 0.25 MG/0.5ML ~~LOC~~ SOAJ: 0.2500 mg | SUBCUTANEOUS | 0 refills | Status: DC

## 2024-01-06 MED ORDER — MONTELUKAST SODIUM 10 MG PO TABS: 10.0000 mg | ORAL_TABLET | Freq: Every day | ORAL | 1 refills | Status: DC

## 2024-01-06 NOTE — Telephone Encounter (Signed)
 PA started

## 2024-01-06 NOTE — Telephone Encounter (Signed)
 Semaglutide -Weight Management (WEGOVY ) 0.25 MG/0.5ML SOAJ   Key: [id number]

## 2024-01-06 NOTE — Progress Notes (Signed)
 Name: Jill Davidson   MRN: [id number]    DOB: Jan 24, 1971   Date:01/06/2024       Progress Note  Subjective  Chief Complaint  Chief Complaint  Patient presents with   Medical Management of Chronic Issues   Hypertension   Discussed the use of AI scribe software for clinical note transcription with the patient, who gave verbal consent to proceed.  History of Present Illness Jill Davidson is a 53 year old female with prediabetes and hypertension who presents for a follow-up visit to discuss CPAP use and A1c monitoring.  She has been using a CPAP machine for almost two months, experiencing dry mouth, likely due to sleeping with her mouth open. Despite this, she feels better and less tired upon waking, indicating improvement in her condition. She uses the CPAP for an average of nearly seven hours per night.  Her A1c was last recorded at 6.1, placing her in the prediabetes range. 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 Recent blood pressure readings were 117/79 and 112/59, which are lower than usual. She was previously advised to reduce her medication dosage due to low blood pressure readings.  Over the weekend, she experienced chest pain described as discomfort when taking a deep breath, located sub sternally and also under the left breast. She was exposed to pollen during this time and has a history of allergies. She also noted coughing up green-yellow mucus in the mornings, which she associates with her allergies. No current chest pain or palpitations, but she reports chest congestion and shortness of breath when exposed to pollen.  She has been trying to lose weight by changing her diet and increasing physical activity but feels 'stuck' despite these efforts. She  reports some weight loss, particularly around her waist, as her uniform pants fit more comfortably.  She is taking vitamin D  and was advised to continue with over-the-counter supplements. Her potassium levels were previously low.    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   OSA (obstructive sleep apnea) 10/04/2023   Seasonal allergies 10/04/2023   Thyroid  nodule 10/04/2023   Vitamin D  deficiency 10/04/2023   Pre-diabetes 10/04/2023   Leukocytosis 10/04/2023   Hypercalcemia 10/04/2023   Dyslipidemia 08/14/2023   Atypical chest pain 07/11/2023   Polyp of ascending colon 11/26/2022   Rectal polyp 11/26/2022   Leg cramp 09/21/2022   Aneurysm of ascending aorta without rupture (HCC) 04/10/2022   Palpitations 04/10/2022   Abnormal CT scan of lung 04/10/2022   Tobacco user 07/24/2018   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) 06/05/2018   Vasomotor symptoms due to menopause 05/06/2018   Hypertension, benign 07/25/2016   History of iron  deficiency anemia 01/25/2015    Past Surgical History:  Procedure Laterality Date   ABDOMINAL HYSTERECTOMY     CESAREAN SECTION     X2   COLONOSCOPY WITH PROPOFOL  N/A 11/26/2022   Procedure: COLONOSCOPY WITH PROPOFOL ;  Surgeon: Selena Daily, MD;  Location: [facility] ENDOSCOPY;  Service: Gastroenterology;  Laterality: N/A;   DILATION AND CURETTAGE OF UTERUS     DILITATION & CURRETTAGE/HYSTROSCOPY WITH NOVASURE ABLATION N/A 07/23/2016   Procedure: DILATATION & CURETTAGE/HYSTEROSCOPY WITH NOVASURE ABLATION;  Surgeon: Colan Dash, MD;  Location: [facility] ORS;  Service: Gynecology;  Laterality: N/A;   LAPAROSCOPIC VAGINAL HYSTERECTOMY WITH SALPINGECTOMY Bilateral 03/24/2018   Procedure: LAPAROSCOPIC ASSISTED VAGINAL HYSTERECTOMY WITH BILATERAL SALPINGECTOMY;  Surgeon: Colan Dash, MD;  Location: [facility] ORS;  Service:  Gynecology;  Laterality: Bilateral;    Family History  Problem Relation Age of Onset   Heart failure Mother    Hypertension Mother    Thyroid  disease Mother    Lung cancer Mother    Asthma Mother    Peripheral Artery Disease Mother        stents in legs   Diabetes Father    Hypertension Father    Hypertension Brother    Heart failure Maternal Aunt    Breast cancer Maternal Aunt        great   Crohn's disease Maternal Aunt    Heart disease Maternal Aunt    Diabetes Maternal Grandmother    Cerebral aneurysm Maternal Grandmother    ADD / ADHD Son    Ovarian cancer Neg Hx     Social History   Tobacco Use   Smoking status: Every Day    Current packs/day: 0.25    Average packs/day: 0.5 packs/day for 31.5 years (15.6 ttl pk-yrs)    Types: Cigarettes    Start date: 07/03/1992   Smokeless tobacco: Never  Substance Use Topics   Alcohol use: Yes    Alcohol/week: 2.0 standard drinks of alcohol    Comment: occasional 1 drink every few weeks     Current Outpatient Medications:    Albuterol -Budesonide (AIRSUPRA ) 90-80 MCG/ACT AERO, Inhale 2 puffs into the lungs 4 (four) times daily as needed., Disp: 107 g, Rfl: 0   aspirin  EC 81 MG tablet, Take 81 mg by mouth daily. Swallow whole., Disp: , Rfl:    Cholecalciferol (VITAMIN D3) 50 MCG (2000 UT) capsule, Take 1 capsule (2,000 Units total) by mouth daily., Disp: 100 capsule, Rfl: 1   loratadine  (CLARITIN ) 10 MG tablet, Take 1 tablet (10 mg total) by mouth daily., Disp: 30 tablet, Rfl: 11   losartan -hydrochlorothiazide  (HYZAAR) 100-12.5 MG tablet, Take 1 tablet by mouth daily., Disp: 90 tablet, Rfl: 1   metFORMIN  (GLUCOPHAGE -XR) 750 MG 24 hr tablet, Take 1 tablet (750 mg total) by mouth daily with breakfast., Disp: 90 tablet, Rfl: 1   potassium chloride  SA (KLOR-CON  M) 20 MEQ tablet, Take 1 tablet (20 mEq total) by mouth daily., Disp: 3 tablet, Rfl: 0  Allergies  Allergen Reactions   Hepatitis B Virus Vaccines     Dysarthria      I personally reviewed active problem list, medication list, allergies, family history with the patient/caregiver today.   ROS  Ten systems reviewed and is negative except as mentioned in HPI    Objective Physical Exam CONSTITUTIONAL: Patient appears well-developed and well-nourished. No distress. HEENT: Head atraumatic, normocephalic, neck supple. CARDIOVASCULAR: Normal rate, regular rhythm and normal heart sounds. No murmur heard. No BLE edema. PULMONARY: Effort normal and breath sounds normal. Lungs clear to auscultation. No respiratory distress. ABDOMINAL: There is no tenderness or distention. MUSCULOSKELETAL: Normal gait. Without gross motor or sensory deficit. PSYCHIATRIC: Patient has a normal mood and affect. Behavior is normal. Judgment and thought content normal.  Vitals:   01/06/24 1443  BP: 122/84  Pulse: 86  Resp: 18  Temp: 98.3 F (36.8 C)  SpO2: 98%  Weight: 234 lb (106.1 kg)  Height: 5\' 3"  (1.6 m)    Body mass index is 41.45  kg/m.  Recent Results (from the past 2160 hours)  Novel Coronavirus, NAA ([facility])     Status: None   Collection Time: 10/17/23  1:59 PM   Specimen: Nasopharyngeal(NP) swabs in vial transport medium   Nasopharynge  Previous  Result Value Ref Range   SARS-CoV-2, NAA Not Detected Not Detected    Comment: This nucleic acid amplification test was developed and its performance characteristics determined by [employer]. Nucleic acid amplification tests include RT-PCR and TMA. This test has not been FDA cleared or approved. This test has been authorized by FDA under an Emergency Use Authorization (EUA). This test is only authorized for the duration of time the declaration that circumstances exist justifying the authorization of the emergency use of in vitro diagnostic tests for detection of SARS-CoV-2 virus and/or diagnosis of COVID-19 infection under section 564(b)(1) of the Act, 21 U.S.C. 829FAO-1(H) (1), unless the  authorization is terminated or revoked sooner. When diagnostic testing is negative, the possibility of a false negative result should be considered in the context of a patient's recent exposures and the presence of clinical signs and symptoms consistent with COVID-19. An individual without symptoms of COVID-19 and who is not shedding SARS-CoV-2 virus wo uld expect to have a negative (not detected) result in this assay.   POC Covid19/Flu A&B Antigen     Status: None   Collection Time: 11/18/23 11:02 AM  Result Value Ref Range   Influenza A Antigen, POC Negative Negative   Influenza B Antigen, POC Negative Negative   Covid Antigen, POC Negative Negative  CBC with Differential/Platelet     Status: Abnormal   Collection Time: 11/20/23  3:43 PM  Result Value Ref Range   WBC 12.0 (H) 3.8 - 10.8 Thousand/uL   RBC 4.86 3.80 - 5.10 Million/uL   Hemoglobin 14.5 11.7 - 15.5 g/dL   HCT 08.6 57.8 - 46.9 %   MCV 86.8 80.0 - 100.0 fL   MCH 29.8 27.0 - 33.0 pg   MCHC 34.4 32.0 - 36.0 g/dL    Comment: For adults, a slight decrease in the calculated MCHC value (in the range of 30 to 32 g/dL) is most likely not clinically significant; however, it should be interpreted with caution in correlation with other red cell parameters and the patient's clinical condition.    RDW 12.5 11.0 - 15.0 %   Platelets 327 140 - 400 Thousand/uL   MPV 10.5 7.5 - 12.5 fL   Neutro Abs 5,712 1,500 - 7,800 cells/uL   Absolute Lymphocytes 5,664 (H) 850 - 3,900 cells/uL   Absolute Monocytes 552 200 - 950 cells/uL   Eosinophils Absolute 48 15 - 500 cells/uL   Basophils Absolute 24 0 - 200 cells/uL   Neutrophils Relative % 47.6 %   Total Lymphocyte 47.2 %   Monocytes Relative 4.6 %   Eosinophils Relative 0.4 %   Basophils Relative 0.2 %  COMPLETE METABOLIC PANEL WITH GFR     Status: Abnormal   Collection Time: 11/20/23  3:43 PM  Result Value Ref Range   Glucose, Bld 104 (H) 65 - 99 mg/dL    Comment: .             Fasting reference interval . For someone without known diabetes, a glucose value between 100 and 125 mg/dL is consistent with prediabetes and should be confirmed with a follow-up test. .    BUN 13 7 - 25 mg/dL   Creat 6.29 5.28 - 4.13 mg/dL   eGFR 244 >  OR = 60 mL/min/1.19m2   BUN/Creatinine Ratio SEE NOTE: 6 - 22 (calc)    Comment:    Not Reported: BUN and Creatinine are within    reference range. .    Sodium 139 135 - 146 mmol/L   Potassium 3.2 (L) 3.5 - 5.3 mmol/L   Chloride 100 98 - 110 mmol/L   CO2 26 20 - 32 mmol/L   Calcium 9.3 8.6 - 10.4 mg/dL   Total Protein 6.8 6.1 - 8.1 g/dL   Albumin 4.0 3.6 - 5.1 g/dL   Globulin 2.8 1.9 - 3.7 g/dL (calc)   AG Ratio 1.4 1.0 - 2.5 (calc)   Total Bilirubin 0.4 0.2 - 1.2 mg/dL   Alkaline phosphatase (APISO) 48 37 - 153 U/L   AST 15 10 - 35 U/L   ALT 12 6 - 29 U/L    Diabetic Foot Exam:     PHQ2/9:    10/04/2023    3:01 PM 08/30/2023    2:10 PM 07/02/2023    1:46 PM 02/12/2023    1:52 PM 11/07/2022    2:47 PM  Depression screen PHQ 2/9  Decreased Interest 0 0 0 0 0  Down, Depressed, Hopeless 0 0 0 0 0  PHQ - 2 Score 0 0 0 0 0  Altered sleeping 0    0  Tired, decreased energy 0    0  Change in appetite 0    0  Feeling bad or failure about yourself  0    0  Trouble concentrating 0    0  Moving slowly or fidgety/restless 0    0  Suicidal thoughts 0    0  PHQ-9 Score 0    0  Difficult doing work/chores Not difficult at all        phq 9 is negative  Fall Risk:    01/06/2024    2:46 PM 10/04/2023    3:01 PM 07/02/2023    1:46 PM 02/12/2023    1:52 PM 11/07/2022    2:40 PM  Fall Risk   Falls in the past year? 0 0 0 0 0  Number falls in past yr: 0 0 0 0   Injury with Fall? 0 0 0 0   Risk for fall due to :  No Fall Risks No Fall Risks No Fall Risks No Fall Risks  Follow up Falls evaluation completed Falls prevention discussed;Education provided;Falls evaluation completed Falls prevention discussed Falls prevention  discussed Falls prevention discussed     Assessment and Plan Assessment & Plan Morbid obesity/BMI over 40 Difficulty losing weight despite lifestyle changes. Interested in Wegovy , a GLP-1 agonist. Insurance requires 5% weight loss for coverage. - Initiate Wegovy  at 0.25 mg weekly, increase to 0.5 mg after one month if tolerated. - Encourage continued lifestyle modifications including diet and exercise. - Monitor weight loss progress; insurance requires 5% weight loss for continued coverage. - Schedule follow-up in three months to assess weight loss progress.  Hypertension Blood pressure well-controlled with losartan  and HCTZ. Mild chest discomfort likely allergy-related. - Continue losartan  with HCTZ 50/12.5 mg daily. Instead of half of losartan  hydrochlorothiazide  100/12.5 mg  - Monitor blood pressure regularly. - Hold prescription for new medication until current supply is finished.  Prediabetes A1c at 6.1%. Managed with metformin . 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 - Continue metformin .  Large Thymus - she will have imaging done tomorrow  Allergic Rhinitis/RAD  Chest congestion and discomfort likely due to pollen exposure. Previous benefit from Singulair . -  Prescribe Singulair  (montelukast ) for allergy management. - Recommend fluticasone  nasal spray for nasal symptoms. - Advise over-the-counter loratadine  for additional allergy relief. - Encourage use of nasal spray and Singulair  during allergy season.  Obstructive Sleep Apnea Improved energy and reduced daytime sleepiness with CPAP. Experiencing dry mouth. - Continue CPAP therapy. - Manage dry mouth by keeping water by the bedside.

## 2024-01-07 ENCOUNTER — Ambulatory Visit
Admission: RE | Admit: 2024-01-07 | Discharge: 2024-01-07 | Disposition: A | Discharge status: Home / Self Care | Source: Ambulatory Visit | Attending: Family Medicine | Admitting: Family Medicine

## 2024-01-07 ENCOUNTER — Encounter: Payer: Self-pay | Admitting: Hematology and Oncology

## 2024-01-07 DIAGNOSIS — J9859 Other diseases of mediastinum, not elsewhere classified: Secondary | ICD-10-CM

## 2024-01-07 MED ORDER — GADOPICLENOL 0.5 MMOL/ML IV SOLN
10.0000 mL | Freq: Once | INTRAVENOUS | Status: AC | PRN
  Administered 2024-01-07: 10 mL via INTRAVENOUS

## 2024-01-09 ENCOUNTER — Encounter: Payer: Self-pay | Admitting: Hematology and Oncology

## 2024-01-12 DIAGNOSIS — G4733 Obstructive sleep apnea (adult) (pediatric): Secondary | ICD-10-CM | POA: Diagnosis not present

## 2024-01-15 ENCOUNTER — Inpatient Hospital Stay: Attending: Oncology | Admitting: Oncology

## 2024-01-15 ENCOUNTER — Inpatient Hospital Stay

## 2024-01-15 ENCOUNTER — Telehealth: Payer: Self-pay

## 2024-01-15 ENCOUNTER — Encounter: Payer: Self-pay | Admitting: Oncology

## 2024-01-15 VITALS — BP 136/79 | HR 64 | Temp 96.2°F | Resp 18 | Wt 234.1 lb

## 2024-01-15 DIAGNOSIS — Z803 Family history of malignant neoplasm of breast: Secondary | ICD-10-CM | POA: Diagnosis not present

## 2024-01-15 DIAGNOSIS — Z801 Family history of malignant neoplasm of trachea, bronchus and lung: Secondary | ICD-10-CM | POA: Diagnosis not present

## 2024-01-15 DIAGNOSIS — Z7984 Long term (current) use of oral hypoglycemic drugs: Secondary | ICD-10-CM | POA: Insufficient documentation

## 2024-01-15 DIAGNOSIS — D72820 Lymphocytosis (symptomatic): Secondary | ICD-10-CM

## 2024-01-15 DIAGNOSIS — R7303 Prediabetes: Secondary | ICD-10-CM | POA: Insufficient documentation

## 2024-01-15 DIAGNOSIS — D72829 Elevated white blood cell count, unspecified: Secondary | ICD-10-CM | POA: Diagnosis present

## 2024-01-15 DIAGNOSIS — Z79899 Other long term (current) drug therapy: Secondary | ICD-10-CM | POA: Diagnosis not present

## 2024-01-15 DIAGNOSIS — F17210 Nicotine dependence, cigarettes, uncomplicated: Secondary | ICD-10-CM | POA: Insufficient documentation

## 2024-01-15 DIAGNOSIS — Z720 Tobacco use: Secondary | ICD-10-CM

## 2024-01-15 LAB — CBC WITH DIFFERENTIAL/PLATELET
Abs Immature Granulocytes: 0.07 10*3/uL (ref 0.00–0.07)
Basophils Absolute: 0.1 10*3/uL (ref 0.0–0.1)
Basophils Relative: 0 %
Eosinophils Absolute: 0.1 10*3/uL (ref 0.0–0.5)
Eosinophils Relative: 1 %
HCT: 37.3 % (ref 36.0–46.0)
Hemoglobin: 12.7 g/dL (ref 12.0–15.0)
Immature Granulocytes: 1 %
Lymphocytes Relative: 40 %
Lymphs Abs: 5.4 10*3/uL — ABNORMAL HIGH (ref 0.7–4.0)
MCH: 29.7 pg (ref 26.0–34.0)
MCHC: 34 g/dL (ref 30.0–36.0)
MCV: 87.1 fL (ref 80.0–100.0)
Monocytes Absolute: 0.7 10*3/uL (ref 0.1–1.0)
Monocytes Relative: 5 %
Neutro Abs: 7.4 10*3/uL (ref 1.7–7.7)
Neutrophils Relative %: 53 %
Platelets: 418 10*3/uL — ABNORMAL HIGH (ref 150–400)
RBC: 4.28 MIL/uL (ref 3.87–5.11)
RDW: 13.4 % (ref 11.5–15.5)
WBC: 13.7 10*3/uL — ABNORMAL HIGH (ref 4.0–10.5)
nRBC: 0 % (ref 0.0–0.2)

## 2024-01-15 LAB — TECHNOLOGIST SMEAR REVIEW
Plt Morphology: NORMAL
RBC MORPHOLOGY: NORMAL
WBC MORPHOLOGY: NORMAL

## 2024-01-15 LAB — LACTATE DEHYDROGENASE: LDH: 114 U/L (ref 98–192)

## 2024-01-15 LAB — HIV ANTIBODY (ROUTINE TESTING W REFLEX): HIV Screen 4th Generation wRfx: NONREACTIVE

## 2024-01-15 LAB — HEPATITIS PANEL, ACUTE
HCV Ab: NONREACTIVE
Hep A IgM: NONREACTIVE
Hep B C IgM: NONREACTIVE
Hepatitis B Surface Ag: NONREACTIVE

## 2024-01-15 NOTE — Assessment & Plan Note (Signed)
 Labs reviewed and discussed with patient that Leukocytosis, differential diagnosis is broad, acute or chronic infection and inflammation, smoking, autoimmune disease, or underlying bone marrow disorders.   For the work up of patient's leukocytosis, I recommend checking CBC;CMP, LDH, smear review, peripheral flowcytometry, hepatitis, HIV, monoclonal gammopathy workup.

## 2024-01-15 NOTE — Assessment & Plan Note (Signed)
 Recommend smoke cessation.

## 2024-01-15 NOTE — Telephone Encounter (Signed)
 Copied from CRM [id number]. Topic: Clinical - Prescription Issue >> Jan 15, 2024  3:30 PM Star [place] wrote: Reason for CRM: Semaglutide -Weight Management (WEGOVY ) 0.25 MG/0.5ML SOAJ- still waiting for PA  - [phone]

## 2024-01-15 NOTE — Progress Notes (Signed)
 Hematology/Oncology Consult note [phone] [phone]        REFERRING PROVIDER: Sowles, Krichna, MD   CHIEF COMPLAINTS/REASON FOR VISIT:  Evaluation of leukocytosis   ASSESSMENT & PLAN:   Leukocytosis Labs reviewed and discussed with patient that Leukocytosis, differential diagnosis is broad, acute or chronic infection and inflammation, smoking, autoimmune disease, or underlying bone marrow disorders.   For the work up of patient's leukocytosis, I recommend checking CBC;CMP, LDH, smear review, peripheral flowcytometry, hepatitis, HIV, monoclonal gammopathy workup.    Tobacco user Recommend smoke cessation   Orders Placed This Encounter  Procedures   CBC with Differential/Platelet    Standing Status:   Future    Number of Occurrences:   1    Expected Date:   01/15/2024    Expiration Date:   01/14/2025   Protein electrophoresis, serum    Standing Status:   Future    Number of Occurrences:   1    Expected Date:   01/15/2024    Expiration Date:   01/14/2025   Flow cytometry panel-leukemia/lymphoma work-up    Standing Status:   Future    Number of Occurrences:   1    Expected Date:   01/15/2024    Expiration Date:   01/14/2025   Lactate dehydrogenase    Standing Status:   Future    Number of Occurrences:   1    Expected Date:   01/15/2024    Expiration Date:   01/14/2025   HIV Antibody (routine testing w rflx)    Standing Status:   Future    Number of Occurrences:   1    Expected Date:   01/15/2024    Expiration Date:   01/14/2025   Hepatitis panel, acute    Standing Status:   Future    Number of Occurrences:   1    Expected Date:   01/15/2024    Expiration Date:   01/14/2025   Technologist smear review    Standing Status:   Future    Number of Occurrences:   1    Expected Date:   01/15/2024    Expiration Date:   01/14/2025    Clinical information::   leukocytosis    All questions were answered. The patient knows to call the clinic with any  problems, questions or concerns.  [name], MD, PhD [facility] Hematology Oncology 01/15/2024   HISTORY OF PRESENTING ILLNESS:   Jill Davidson is a  53 y.o.  female with PMH listed below was seen in consultation at the request of  Sowles, Krichna, MD  for evaluation of leukocytosis  She has had an elevated white blood cell count since at least 2019, primarily due to increased lymphocytes. No unintentional weight loss, chronic joint pain, excessive sweating at night, or fever. She maintains good baseline health but notes a decreased appetite, often skipping dinner.  She is currently on metformin  for borderline diabetes. She has not yet started on Wegovy  for weight management.  She smokes less than a pack of cigarettes every two days and is attempting to quit.   MEDICAL HISTORY:  Past Medical History:  Diagnosis Date   Allergy    Not sure of date   AR (allergic rhinitis)    Bacterial vaginosis    Complication of anesthesia 2008   PT STATES THAT DURING C-SECTION SHE WAS ADMINISTERED A GAS THAT CAUSED HER HEART TO BEAT IRREGULAR- PT HAS HAD NO PROBLEMS WITH THIS SINCE 2008   Dysrhythmia  DUE TO A GAS THAT WAS ADMINISTERED DURING A 2008 C-SECTION-NO PROBLEMS SINCE   Fibroid    GERD (gastroesophageal reflux disease)    OCC   Hypertension    IDA (iron  deficiency anemia) 01/25/2015   UTI (lower urinary tract infection)     SURGICAL HISTORY: Past Surgical History:  Procedure Laterality Date   ABDOMINAL HYSTERECTOMY     CESAREAN SECTION     X2   COLONOSCOPY WITH PROPOFOL  N/A 11/26/2022   Procedure: COLONOSCOPY WITH PROPOFOL ;  Surgeon: Selena Daily, MD;  Location: [facility];  Service: Gastroenterology;  Laterality: N/A;   DILATION AND CURETTAGE OF UTERUS     DILITATION & CURRETTAGE/HYSTROSCOPY WITH NOVASURE ABLATION N/A 07/23/2016   Procedure: DILATATION & CURETTAGE/HYSTEROSCOPY WITH NOVASURE ABLATION;  Surgeon: Colan Dash, MD;  Location: [facility] ORS;  Service:  Gynecology;  Laterality: N/A;   LAPAROSCOPIC VAGINAL HYSTERECTOMY WITH SALPINGECTOMY Bilateral 03/24/2018   Procedure: LAPAROSCOPIC ASSISTED VAGINAL HYSTERECTOMY WITH BILATERAL SALPINGECTOMY;  Surgeon: Colan Dash, MD;  Location: [facility] ORS;  Service: Gynecology;  Laterality: Bilateral;    SOCIAL HISTORY: Social History   Socioeconomic History   Marital status: Single    Spouse name: Not on file   Number of children: Not on file   Years of education: Not on file   Highest education level: Associate degree: occupational, Scientist, product/process development, or vocational program  Occupational History   Not on file  Tobacco Use   Smoking status: Every Day    Current packs/day: 0.25    Average packs/day: 0.5 packs/day for 31.5 years (15.6 ttl pk-yrs)    Types: Cigarettes    Start date: 07/03/1992   Smokeless tobacco: Never  Vaping Use   Vaping status: Never Used  Substance and Sexual Activity   Alcohol use: Yes    Alcohol/week: 2.0 standard drinks of alcohol    Comment: occasional 1 drink every few weeks   Drug use: Not Currently    Types: Marijuana   Sexual activity: Not Currently    Birth control/protection: Surgical, Condom    Comment: Ablation  Other Topics Concern   Not on file  Social History Narrative   She is a Producer, television/film/video, 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   Overall Financial Resource Strain (CARDIA)    Difficulty of Paying Living Expenses: Not hard at all  Food Insecurity: No Food Insecurity (12/11/2023)   Received from [facility]   Hunger Vital Sign    Worried About Running Out of Food in the Last Year: Never true    Ran Out of Food in the Last Year: Never true  Transportation Needs: No Transportation Needs (12/11/2023)   Received from [facility] - Transportation    In the past 12 months, has lack of transportation kept you from medical appointments or from getting medications?: No    Lack of Transportation (Non-Medical): No  Physical Activity: Sufficiently Active (08/30/2023)   Exercise Vital Sign    Days of Exercise per Week: 7 days    Minutes of Exercise per Session: 120 min  Stress: No Stress Concern Present (08/30/2023)   [employer] of Occupational Health - Occupational Stress Questionnaire    Feeling of Stress : Not at all  Social Connections:  Moderately Isolated (08/30/2023)   Social Connection and Isolation Panel [NHANES]    Frequency of Communication with Friends and Family: More than three times a week    Frequency of Social Gatherings with Friends and Family: Three times a week    Attends Religious Services: More than 4 times per year    Active Member of Clubs or Organizations: No    Attends Banker Meetings: Not on file    Marital Status: Never married  Intimate Partner Violence: Not At Risk (08/30/2023)   Humiliation, Afraid, Rape, and Kick questionnaire    Fear of Current or Ex-Partner: No    Emotionally Abused: No    Physically Abused: No    Sexually Abused: No    FAMILY HISTORY: Family History  Problem Relation Age of Onset   Heart failure Mother    Hypertension Mother    Thyroid  disease Mother    Lung cancer Mother    Asthma Mother    Peripheral Artery Disease Mother        stents in legs   Diabetes Father    Hypertension Father    Hypertension Brother    Heart failure Maternal Aunt    Breast cancer Maternal Aunt        great   Crohn's disease Maternal Aunt    Heart disease Maternal Aunt    Diabetes Maternal Grandmother    Cerebral aneurysm Maternal Grandmother    ADD / ADHD Son    Ovarian cancer Neg Hx     ALLERGIES:  is allergic to hepatitis b virus vaccines.  MEDICATIONS:  Current Outpatient Medications  Medication Sig Dispense Refill    Albuterol -Budesonide (AIRSUPRA ) 90-80 MCG/ACT AERO Inhale 2 puffs into the lungs 4 (four) times daily as needed. 107 g 0   aspirin  EC 81 MG tablet Take 81 mg by mouth daily. Swallow whole.     Cholecalciferol (VITAMIN D3) 50 MCG (2000 UT) capsule Take 1 capsule (2,000 Units total) by mouth daily. 100 capsule 1   fluticasone  (FLONASE ) 50 MCG/ACT nasal spray Place 2 sprays into both nostrils daily. 16 g 2   loratadine  (CLARITIN ) 10 MG tablet Take 1 tablet (10 mg total) by mouth daily. 30 tablet 11   losartan -hydrochlorothiazide  (HYZAAR) 50-12.5 MG tablet Take 1 tablet by mouth daily. 90 tablet 0   metFORMIN  (GLUCOPHAGE -XR) 750 MG 24 hr tablet Take 1 tablet (750 mg total) by mouth daily with breakfast. 90 tablet 1   montelukast  (SINGULAIR ) 10 MG tablet Take 1 tablet (10 mg total) by mouth at bedtime. 90 tablet 1   Semaglutide -Weight Management (WEGOVY ) 0.25 MG/0.5ML SOAJ Inject 0.25 mg into the skin once a week. 2 mL 0   No current facility-administered medications for this visit.    Review of Systems  Constitutional:  Negative for chills, fatigue and fever.  HENT:   Negative for hearing loss and voice change.   Eyes:  Negative for eye problems.  Respiratory:  Negative for chest tightness and cough.   Cardiovascular:  Negative for chest pain.  Gastrointestinal:  Negative for abdominal distention, abdominal pain and blood in stool.  Endocrine: Negative for hot flashes.  Genitourinary:  Negative for difficulty urinating and frequency.   Musculoskeletal:  Negative for arthralgias.  Skin:  Negative for itching and rash.  Neurological:  Negative for extremity weakness.  Hematological:  Negative for adenopathy.  Psychiatric/Behavioral:  Negative for confusion.    PHYSICAL EXAMINATION: ECOG PERFORMANCE STATUS: 0 - Asymptomatic Vitals:   01/15/24 1439  BP:  136/79  Pulse: 64  Resp: 18  Temp: (!) 96.2 F (35.7 C)  SpO2: 96%   Filed Weights   01/15/24 1439  Weight: 234 lb 1.6 oz (106.2 kg)     Physical Exam Constitutional:      General: She is not in acute distress. HENT:     Head: Normocephalic and atraumatic.  Eyes:     General: No scleral icterus. Cardiovascular:     Rate and Rhythm: Normal rate and regular rhythm.     Heart sounds: Normal heart sounds.  Pulmonary:     Effort: Pulmonary effort is normal. No respiratory distress.     Breath sounds: No wheezing.  Abdominal:     General: Bowel sounds are normal. There is no distension.     Palpations: Abdomen is soft.  Musculoskeletal:        General: No deformity. Normal range of motion.     Cervical back: Normal range of motion and neck supple.  Skin:    General: Skin is warm and dry.     Findings: No erythema or rash.  Neurological:     Mental Status: She is alert and oriented to person, place, and time. Mental status is at baseline.  Psychiatric:        Mood and Affect: Mood normal.     LABORATORY DATA:  I have reviewed the data as listed    Latest Ref Rng & Units 01/15/2024    3:08 PM 11/20/2023    3:43 PM 08/30/2023    2:38 PM  CBC  WBC 4.0 - 10.5 K/uL 13.7  12.0  14.0   Hemoglobin 12.0 - 15.0 g/dL 16.1  09.6  04.5   Hematocrit 36.0 - 46.0 % 37.3  42.2  44.9   Platelets 150 - 400 K/uL 418  327  418       Latest Ref Rng & Units 11/20/2023    3:43 PM 10/04/2023    3:45 PM 08/30/2023    2:38 PM  CMP  Glucose 65 - 99 mg/dL 409   88   BUN 7 - 25 mg/dL 13   13   Creatinine 8.11 - 1.03 mg/dL 9.14   7.82   Sodium 956 - 146 mmol/L 139   140   Potassium 3.5 - 5.3 mmol/L 3.2   3.8   Chloride 98 - 110 mmol/L 100   102   CO2 20 - 32 mmol/L 26   29   Calcium 8.6 - 10.4 mg/dL 9.3  9.7  21.3   Total Protein 6.1 - 8.1 g/dL 6.8   7.2   Total Bilirubin 0.2 - 1.2 mg/dL 0.4   0.4   AST 10 - 35 U/L 15   14   ALT 6 - 29 U/L 12   11       RADIOGRAPHIC STUDIES: I have personally reviewed the radiological images as listed and agreed with the findings in the report. DG Chest 2 View Result Date:  11/20/2023 CLINICAL DATA:  Cough, fatigue, and vomiting for several days. EXAM: CHEST - 2 VIEW COMPARISON:  06/21/2022 FINDINGS: The heart size and mediastinal contours are within normal limits. Both lungs are clear. The visualized skeletal structures are unremarkable. IMPRESSION: No active cardiopulmonary disease. Electronically Signed   By: Marlyce Sine M.D.   On: 11/20/2023 19:03

## 2024-01-15 NOTE — Telephone Encounter (Signed)
 I see you started it on a previous encounter that you documented, any updates?

## 2024-01-16 ENCOUNTER — Ambulatory Visit
Admission: RE | Admit: 2024-01-16 | Discharge: 2024-01-16 | Disposition: A | Discharge status: Home / Self Care | Source: Ambulatory Visit | Attending: Vascular Surgery | Admitting: Vascular Surgery

## 2024-01-16 DIAGNOSIS — I7121 Aneurysm of the ascending aorta, without rupture: Secondary | ICD-10-CM | POA: Insufficient documentation

## 2024-01-16 LAB — PROTEIN ELECTROPHORESIS, SERUM
A/G Ratio: 1 (ref 0.7–1.7)
Albumin ELP: 3.4 g/dL (ref 2.9–4.4)
Alpha-1-Globulin: 0.3 g/dL (ref 0.0–0.4)
Alpha-2-Globulin: 0.9 g/dL (ref 0.4–1.0)
Beta Globulin: 1.2 g/dL (ref 0.7–1.3)
Gamma Globulin: 1 g/dL (ref 0.4–1.8)
Globulin, Total: 3.3 g/dL (ref 2.2–3.9)
Total Protein ELP: 6.7 g/dL (ref 6.0–8.5)

## 2024-01-16 NOTE — Telephone Encounter (Signed)
 When I pulled cover my meds looks like it was denied

## 2024-01-20 ENCOUNTER — Ambulatory Visit (INDEPENDENT_AMBULATORY_CARE_PROVIDER_SITE_OTHER): Admitting: Nurse Practitioner

## 2024-01-20 ENCOUNTER — Encounter (INDEPENDENT_AMBULATORY_CARE_PROVIDER_SITE_OTHER): Payer: Self-pay | Admitting: Nurse Practitioner

## 2024-01-20 VITALS — BP 130/86 | HR 67 | Resp 16 | Wt 231.6 lb

## 2024-01-20 DIAGNOSIS — I10 Essential (primary) hypertension: Secondary | ICD-10-CM

## 2024-01-20 DIAGNOSIS — I7121 Aneurysm of the ascending aorta, without rupture: Secondary | ICD-10-CM | POA: Diagnosis not present

## 2024-01-20 LAB — COMP PANEL: LEUKEMIA/LYMPHOMA

## 2024-01-21 ENCOUNTER — Encounter (INDEPENDENT_AMBULATORY_CARE_PROVIDER_SITE_OTHER): Payer: Self-pay | Admitting: Nurse Practitioner

## 2024-01-21 NOTE — Progress Notes (Unsigned)
 Subjective:    Patient ID: Jill Davidson, female    DOB: Jun 17, 1971, 53 y.o.   MRN: [id number] Chief Complaint  Patient presents with   Follow-up    Ct results    The patient presents to the office for evaluation of an ascending thoracic aortic aneurysm. The aneurysm was found incidentally by CT scan. Patient denies chest pain or unusual back pain, no other chest or abdominal complaints.  No history of an abrupt onset of a painful toe associated with blue discoloration.      No family history of TAA/AAA.    Patient denies amaurosis fugax or TIA symptoms.  There is no history of claudication or rest pain symptoms of the lower extremities.   The patient denies angina or shortness of breath.   CT scan dated 11/27/2022, shows an ascending TAA that measures 4.2 cm.  Past CT reported as 4.8 cm.   Current CT scan done on 01/16/2024, has not had a formal read as of yet, however it is independently reviewed by myself and Dr. Prescilla Brod, and I feel that the aneurysm is currently 4.2, without significant increase from last year.     Review of Systems  Respiratory:  Negative for chest tightness and shortness of breath.   Cardiovascular:  Negative for chest pain.  All other systems reviewed and are negative.      Objective:   Physical Exam Vitals reviewed.  HENT:     Head: Normocephalic.  Neck:     Vascular: No carotid bruit.  Cardiovascular:     Rate and Rhythm: Normal rate.     Pulses: Normal pulses.  Pulmonary:     Effort: Pulmonary effort is normal.  Skin:    General: Skin is warm and dry.  Neurological:     Mental Status: She is alert and oriented to person, place, and time.  Psychiatric:        Mood and Affect: Mood normal.        Behavior: Behavior normal.        Thought Content: Thought content normal.        Judgment: Judgment normal.     BP 130/86   Pulse 67   Resp 16   Wt 231 lb 9.6 oz (105.1 kg)   LMP 03/21/2018 (Exact Date)   BMI 41.03 kg/m   Past Medical  History:  Diagnosis Date   Allergy    Not sure of date   AR (allergic rhinitis)    Bacterial vaginosis    Complication of anesthesia 2008   PT STATES THAT DURING C-SECTION SHE WAS ADMINISTERED A GAS THAT CAUSED HER HEART TO BEAT IRREGULAR- PT HAS HAD NO PROBLEMS WITH THIS SINCE 2008   Dysrhythmia    DUE TO A GAS THAT WAS ADMINISTERED DURING A 2008 C-SECTION-NO PROBLEMS SINCE   Fibroid    GERD (gastroesophageal reflux disease)    OCC   Hypertension    IDA (iron  deficiency anemia) 01/25/2015   UTI (lower urinary tract infection)     Social History   Socioeconomic History   Marital status: Single    Spouse name: Not on file   Number of children: Not on file   Years of education: Not on file   Highest education level: Associate degree: occupational, Scientist, product/process development, or vocational program  Occupational History   Not on file  Tobacco Use   Smoking status: Every Day    Current packs/day: 0.25    Average packs/day: 0.5 packs/day for 31.6 years (15.6  ttl pk-yrs)    Types: Cigarettes    Start date: 07/03/1992   Smokeless tobacco: Never  Vaping Use   Vaping status: Never Used  Substance and Sexual Activity   Alcohol use: Yes    Alcohol/week: 2.0 standard drinks of alcohol    Comment: occasional 1 drink every few weeks   Drug use: Not Currently    Types: Marijuana   Sexual activity: Not Currently    Birth control/protection: Surgical, Condom    Comment: Ablation  Other Topics Concern   Not on file  Social History Narrative   She is a Producer, television/film/video, 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   Overall Financial Resource Strain (CARDIA)    Difficulty of Paying Living Expenses: Not hard at all  Food Insecurity: No Food Insecurity (12/11/2023)   Received from [facility]    Hunger Vital Sign    Worried About Running Out of Food in the Last Year: Never true    Ran Out of Food in the Last Year: Never true  Transportation Needs: No Transportation Needs (12/11/2023)   Received from [facility]    In the past 12 months, has lack of transportation kept you from medical appointments or from getting medications?: No    Lack of Transportation (Non-Medical): No  Physical Activity: Sufficiently Active (08/30/2023)   Exercise Vital Sign    Days of Exercise per Week: 7 days    Minutes of Exercise per Session: 120 min  Stress: No Stress Concern Present (08/30/2023)   [employer] of Occupational Health - Occupational Stress Questionnaire    Feeling of Stress : Not at all  Social Connections: Moderately Isolated (08/30/2023)   Social Connection and Isolation Panel [NHANES]    Frequency of Communication with Friends and Family: More than three times a week    Frequency of Social Gatherings with Friends and Family: Three times a week    Attends Religious Services: More than 4 times per year    Active Member of Clubs or Organizations: No    Attends Banker Meetings: Not on file    Marital Status: Never married  Intimate Partner Violence: Not At Risk (08/30/2023)   Humiliation, Afraid, Rape, and Kick questionnaire    Fear of Current or Ex-Partner: No    Emotionally Abused: No    Physically Abused: No    Sexually Abused: No    Past Surgical History:  Procedure Laterality Date   ABDOMINAL HYSTERECTOMY     CESAREAN SECTION     X2   COLONOSCOPY WITH PROPOFOL  N/A 11/26/2022   Procedure: COLONOSCOPY WITH PROPOFOL ;  Surgeon: Selena Daily, MD;  Location: [facility] END[facility];  Service: Gastroenterology;  Laterality: N/A;   DILATION AND CURETTAGE OF UTERUS     DILITATION & CURRETTAGE/HYSTROSCOPY WITH NOVASURE ABLATION N/A 07/23/2016   Procedure: DILATATION & CURETTAGE/HYSTEROSCOPY WITH NOVASURE ABLATION;   Surgeon: Colan Dash, MD;  Location: [facility] ORS;  Service: Gynecology;  Laterality: N/A;   LAPAROSCOPIC VAGINAL HYSTERECTOMY WITH SALPINGECTOMY Bilateral 03/24/2018   Procedure: LAPAROSCOPIC ASSISTED VAGINAL HYSTERECTOMY WITH BILATERAL SALPINGECTOMY;  Surgeon: Colan Dash, MD;  Location: [facility] ORS;  Service: Gynecology;  Laterality: Bilateral;    Family History  Problem Relation Age  of Onset   Heart failure Mother    Hypertension Mother    Thyroid  disease Mother    Lung cancer Mother    Asthma Mother    Peripheral Artery Disease Mother        stents in legs   Diabetes Father    Hypertension Father    Hypertension Brother    Heart failure Maternal Aunt    Breast cancer Maternal Aunt        great   Crohn's disease Maternal Aunt    Heart disease Maternal Aunt    Diabetes Maternal Grandmother    Cerebral aneurysm Maternal Grandmother    ADD / ADHD Son    Ovarian cancer Neg Hx     Allergies  Allergen Reactions   Hepatitis B Virus Vaccines     Dysarthria        Latest Ref Rng & Units 01/15/2024    3:08 PM 11/20/2023    3:43 PM 08/30/2023    2:38 PM  CBC  WBC 4.0 - 10.5 K/uL 13.7  12.0  14.0   Hemoglobin 12.0 - 15.0 g/dL 96.0  45.4  09.8   Hematocrit 36.0 - 46.0 % 37.3  42.2  44.9   Platelets 150 - 400 K/uL 418  327  418       CMP     Component Value Date/Time   NA 139 11/20/2023 1543   NA 137 08/02/2017 1206   K 3.2 (L) 11/20/2023 1543   K 3.6 08/02/2017 1206   CL 100 11/20/2023 1543   CO2 26 11/20/2023 1543   CO2 23 08/02/2017 1206   GLUCOSE 104 (H) 11/20/2023 1543   GLUCOSE 86 08/02/2017 1206   BUN 13 11/20/2023 1543   BUN 8.8 08/02/2017 1206   CREATININE 0.73 11/20/2023 1543   CREATININE 0.7 08/02/2017 1206   CALCIUM 9.3 11/20/2023 1543   CALCIUM 8.9 08/02/2017 1206   PROT 6.8 11/20/2023 1543   PROT 7.2 08/02/2017 1206   ALBUMIN 2.5 (L) 01/29/2022 0348   ALBUMIN 3.5 08/02/2017 1206   AST 15 11/20/2023 1543   AST 16 02/24/2018 1028    AST 16 08/02/2017 1206   ALT 12 11/20/2023 1543   ALT 11 02/24/2018 1028   ALT 13 08/02/2017 1206   ALKPHOS 40 01/29/2022 0348   ALKPHOS 52 08/02/2017 1206   BILITOT 0.4 11/20/2023 1543   BILITOT 0.3 02/24/2018 1028   BILITOT 0.53 08/02/2017 1206   EGFR 100 11/20/2023 1543   GFRNONAA >60 09/20/2022 1705   GFRNONAA 105 09/05/2020 1530     No results found.     Assessment & Plan:   1. Aneurysm of ascending aorta without rupture (HCC) (Primary) 1. Aneurysm of ascending aorta without rupture (HCC) Recommend:   No surgery or intervention is indicated at this time.   The patient has an asymptomatic thoracic aortic aneurysm that is less than 6.0 cm in maximal diameter.  I have discussed the natural history of thoracic aortic aneurysm and the small risk of rupture for aneurysm less than 6.5 cm in size.  However, as these small aneurysms tend to enlarge over time, continued surveillance with CT scan is mandatory.    I have also discussed optimizing medical management with hypertension and lipid control and the importance of abstinence from tobacco.  The patient is also encouraged to exercise a minimum of 30 minutes 4 times a week.    Should the patient develop new onset chest or back pain or signs of peripheral embolization  they are instructed to seek medical attention immediately and to alert the physician providing care that they have an aneurysm in the chest.    The patient voices their understanding.   The patient will return as ordered with a CT scan of the chest - CT CHEST WO CONTRAST; Future  2. Essential hypertension Continue antihypertensive medications as already ordered, these medications have been reviewed and there are no changes at this time.   Current Outpatient Medications on File Prior to Visit  Medication Sig Dispense Refill   Albuterol -Budesonide (AIRSUPRA ) 90-80 MCG/ACT AERO Inhale 2 puffs into the lungs 4 (four) times daily as needed. 107 g 0   aspirin  EC 81 MG  tablet Take 81 mg by mouth daily. Swallow whole.     Cholecalciferol (VITAMIN D3) 50 MCG (2000 UT) capsule Take 1 capsule (2,000 Units total) by mouth daily. 100 capsule 1   fluticasone  (FLONASE ) 50 MCG/ACT nasal spray Place 2 sprays into both nostrils daily. 16 g 2   loratadine  (CLARITIN ) 10 MG tablet Take 1 tablet (10 mg total) by mouth daily. 30 tablet 11   losartan -hydrochlorothiazide  (HYZAAR) 50-12.5 MG tablet Take 1 tablet by mouth daily. 90 tablet 0   metFORMIN  (GLUCOPHAGE -XR) 750 MG 24 hr tablet Take 1 tablet (750 mg total) by mouth daily with breakfast. 90 tablet 1   montelukast  (SINGULAIR ) 10 MG tablet Take 1 tablet (10 mg total) by mouth at bedtime. 90 tablet 1   Semaglutide -Weight Management (WEGOVY ) 0.25 MG/0.5ML SOAJ Inject 0.25 mg into the skin once a week. 2 mL 0   No current facility-administered medications on file prior to visit.    There are no Patient Instructions on file for this visit. No follow-ups on file.   Naheim Burgen E Doretha Goding, NP

## 2024-01-22 ENCOUNTER — Encounter (INDEPENDENT_AMBULATORY_CARE_PROVIDER_SITE_OTHER): Payer: Self-pay

## 2024-01-24 ENCOUNTER — Telehealth: Payer: Self-pay | Admitting: Pharmacy Technician

## 2024-01-24 ENCOUNTER — Other Ambulatory Visit: Payer: Self-pay | Admitting: Family Medicine

## 2024-01-24 ENCOUNTER — Other Ambulatory Visit (HOSPITAL_COMMUNITY): Payer: Self-pay

## 2024-01-24 ENCOUNTER — Encounter: Payer: Self-pay | Admitting: Hematology and Oncology

## 2024-01-24 NOTE — Telephone Encounter (Signed)
 Pharmacy Patient Advocate Encounter   Received notification from Onbase that prior authorization for Wegovy  0.25MG /0.5ML auto-injectors is required/requested.   Insurance verification completed.   The patient is insured through [facility] .   Per test claim: PA required; PA submitted to above mentioned insurance via CoverMyMeds Key/confirmation #/EOC [id number] Status is pending

## 2024-01-24 NOTE — Telephone Encounter (Signed)
 Pharmacy Patient Advocate Encounter  Received notification from [employer] that Prior Authorization for Wegovy  0.25MG /0.5ML auto-injectors has been APPROVED from 01/24/24 to 07/26/24. Ran test claim, Copay is $4.00. This test claim was processed through [facility]- copay amounts may vary at other pharmacies due to pharmacy/plan contracts, or as the patient moves through the different stages of their insurance plan.   PA #/Case ID/Reference #: [id number]

## 2024-01-27 DIAGNOSIS — E041 Nontoxic single thyroid nodule: Secondary | ICD-10-CM | POA: Diagnosis not present

## 2024-01-28 ENCOUNTER — Ambulatory Visit: Payer: Self-pay | Admitting: Family Medicine

## 2024-02-04 ENCOUNTER — Encounter (INDEPENDENT_AMBULATORY_CARE_PROVIDER_SITE_OTHER): Payer: Self-pay

## 2024-02-13 ENCOUNTER — Inpatient Hospital Stay: Attending: Oncology | Admitting: Oncology

## 2024-02-13 ENCOUNTER — Encounter: Payer: Self-pay | Admitting: Oncology

## 2024-02-13 VITALS — BP 128/68 | HR 72 | Temp 96.6°F | Resp 18 | Wt 235.8 lb

## 2024-02-13 DIAGNOSIS — D72820 Lymphocytosis (symptomatic): Secondary | ICD-10-CM | POA: Diagnosis not present

## 2024-02-13 DIAGNOSIS — Z720 Tobacco use: Secondary | ICD-10-CM | POA: Diagnosis not present

## 2024-02-13 DIAGNOSIS — F17210 Nicotine dependence, cigarettes, uncomplicated: Secondary | ICD-10-CM | POA: Diagnosis not present

## 2024-02-13 DIAGNOSIS — Z79899 Other long term (current) drug therapy: Secondary | ICD-10-CM | POA: Diagnosis not present

## 2024-02-13 DIAGNOSIS — D72828 Other elevated white blood cell count: Secondary | ICD-10-CM

## 2024-02-13 DIAGNOSIS — Z801 Family history of malignant neoplasm of trachea, bronchus and lung: Secondary | ICD-10-CM | POA: Diagnosis not present

## 2024-02-13 DIAGNOSIS — R7303 Prediabetes: Secondary | ICD-10-CM | POA: Diagnosis not present

## 2024-02-13 DIAGNOSIS — Z803 Family history of malignant neoplasm of breast: Secondary | ICD-10-CM | POA: Insufficient documentation

## 2024-02-13 DIAGNOSIS — D72829 Elevated white blood cell count, unspecified: Secondary | ICD-10-CM | POA: Insufficient documentation

## 2024-02-13 NOTE — Progress Notes (Signed)
 Hematology/Oncology Progress note [phone] [phone]           REFERRING PROVIDER: Sowles, Krichna, MD   CHIEF COMPLAINTS/REASON FOR VISIT:  Chronic leukocytosis   ASSESSMENT & PLAN:   Leukocytosis Lab results were reviewed and discussed with patient. Patient has chronic leukocytosis, predominantly lymphocytosis.  Normal LDH, negative peripheral flowcytometry, negative hepatitis, negative HIV, monoclonal gammopathy workup showed no M protein. Flow cytometry shows increase of both B and T lymphocytes, indicating chronic inflammation. Discussed with patient that leukocytosis is likely reactive, smoking possibly attributes to that. Recommend observation.   Tobacco user Recommend smoke cessation   Orders Placed This Encounter  Procedures   CMP (Cancer Center only)    Standing Status:   Future    Expected Date:   02/12/2025    Expiration Date:   02/12/2025   CBC with Differential (Cancer Center Only)    Standing Status:   Future    Expected Date:   02/12/2025    Expiration Date:   02/12/2025   Lactate dehydrogenase    Standing Status:   Future    Expected Date:   02/12/2025    Expiration Date:   02/12/2025  Patient prefers to be followed at our clinic. Follow-up in 1 year. All questions were answered. The patient knows to call the clinic with any problems, questions or concerns.  Jill Forbes, MD, PhD [facility] Hematology Oncology 02/13/2024   HISTORY OF PRESENTING ILLNESS:   Jill Davidson is a  53 y.o.  female with PMH listed below was seen in consultation at the request of  Arleen Lacer, MD  for evaluation of leukocytosis  She has had an elevated white blood cell count since at least 2019, primarily due to increased lymphocytes. No unintentional weight loss, chronic joint pain, excessive sweating at night, or fever. She maintains good baseline health but notes a decreased appetite, often skipping dinner.  She is currently on metformin  for  borderline diabetes. She has not yet started on Wegovy  for weight management.  She smokes less than a pack of cigarettes every two days and is attempting to quit.  INTERVAL HISTORY ANALUISA Davidson is a 53 y.o. female who has above history reviewed by me today presents for follow up visit for leukocytosis. Patient has no new complaints.   MEDICAL HISTORY:  Past Medical History:  Diagnosis Date   Allergy    Not sure of date   AR (allergic rhinitis)    Bacterial vaginosis    Complication of anesthesia 2008   PT STATES THAT DURING C-SECTION SHE WAS ADMINISTERED A GAS THAT CAUSED HER HEART TO BEAT IRREGULAR- PT HAS HAD NO PROBLEMS WITH THIS SINCE 2008   Dysrhythmia    DUE TO A GAS THAT WAS ADMINISTERED DURING A 2008 C-SECTION-NO PROBLEMS SINCE   Fibroid    GERD (gastroesophageal reflux disease)    OCC   Hypertension    IDA (iron  deficiency anemia) 01/25/2015   UTI (lower urinary tract infection)     SURGICAL HISTORY: Past Surgical History:  Procedure Laterality Date   ABDOMINAL HYSTERECTOMY     CESAREAN SECTION     X2   COLONOSCOPY WITH PROPOFOL  N/A 11/26/2022   Procedure: COLONOSCOPY WITH PROPOFOL ;  Surgeon: Selena Daily, MD;  Location: [facility] END[facility]COPY;  Service: Gastroenterology;  Laterality: N/A;   DILATION AND CURETTAGE OF UTERUS     DILITATION & CURRETTAGE/HYSTROSCOPY WITH NOVASURE ABLATION N/A 07/23/2016   Procedure: DILATATION & CURETTAGE/HYSTEROSCOPY WITH NOVASURE ABLATION;  Surgeon: Gaylyn Keas  A Defrancesco, MD;  Location: [facility] ORS;  Service: Gynecology;  Laterality: N/A;   LAPAROSCOPIC VAGINAL HYSTERECTOMY WITH SALPINGECTOMY Bilateral 03/24/2018   Procedure: LAPAROSCOPIC ASSISTED VAGINAL HYSTERECTOMY WITH BILATERAL SALPINGECTOMY;  Surgeon: Colan Dash, MD;  Location: [facility] ORS;  Service: Gynecology;  Laterality: Bilateral;    SOCIAL HISTORY: Social History   Socioeconomic History   Marital status: Single    Spouse name: Not on file   Number of children:  Not on file   Years of education: Not on file   Highest education level: Associate degree: occupational, Scientist, product/process development, or vocational program  Occupational History   Not on file  Tobacco Use   Smoking status: Every Day    Current packs/day: 0.25    Average packs/day: 0.5 packs/day for 31.6 years (15.6 ttl pk-yrs)    Types: Cigarettes    Start date: 07/03/1992   Smokeless tobacco: Never  Vaping Use   Vaping status: Never Used  Substance and Sexual Activity   Alcohol use: Yes    Alcohol/week: 2.0 standard drinks of alcohol    Comment: occasional 1 drink every few weeks   Drug use: Not Currently    Types: Marijuana   Sexual activity: Not Currently    Birth control/protection: Surgical, Condom    Comment: Ablation  Other Topics Concern   Not on file  Social History Narrative   She is a Producer, television/film/video, 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   Overall Financial Resource Strain (CARDIA)    Difficulty of Paying Living Expenses: Not hard at all  Food Insecurity: No Food Insecurity (12/11/2023)   [facility]   Hunger Vital Sign    Worried About Running Out of Food in the Last Year: Never true    Ran Out of Food in the Last Year: Never true  Transportation Needs: No Transportation Needs (12/11/2023)   Received from [facility] - Transportation    In the past 12 months, has lack of transportation kept you from medical appointments or from getting medications?: No    Lack of Transportation (Non-Medical): No  Physical Activity: Sufficiently Active (08/30/2023)   Exercise Vital Sign    Days of Exercise per Week: 7 days    Minutes of Exercise per Session: 120 min  Stress: No Stress Concern Present (08/30/2023)   [employer] of  Occupational Health - Occupational Stress Questionnaire    Feeling of Stress : Not at all  Social Connections: Moderately Isolated (08/30/2023)   Social Connection and Isolation Panel [NHANES]    Frequency of Communication with Friends and Family: More than three times a week    Frequency of Social Gatherings with Friends and Family: Three times a week    Attends Religious Services: More than 4 times per year    Active Member of Clubs or Organizations: No    Attends Banker Meetings: Not on file    Marital Status: Never married  Intimate Partner Violence: Not At Risk (08/30/2023)   Humiliation, Afraid, Rape, and Kick questionnaire    Fear of Current or Ex-Partner: No    Emotionally Abused: No    Physically Abused: No    Sexually Abused: No    FAMILY HISTORY: Family History  Problem Relation Age of Onset   Heart failure Mother    Hypertension Mother    Thyroid  disease Mother    Lung cancer Mother    Asthma Mother    Peripheral Artery Disease Mother        stents in legs   Diabetes Father    Hypertension Father    Hypertension Brother    Heart failure Maternal Aunt    Breast cancer Maternal Aunt        great   Crohn's disease Maternal Aunt    Heart disease Maternal Aunt    Diabetes Maternal Grandmother    Cerebral aneurysm Maternal Grandmother    ADD / ADHD Son    Ovarian cancer Neg Hx     ALLERGIES:  is allergic to hepatitis b virus vaccines.  MEDICATIONS:  Current Outpatient Medications  Medication Sig Dispense Refill   Albuterol -Budesonide (AIRSUPRA ) 90-80 MCG/ACT AERO Inhale 2 puffs into the lungs 4 (four) times daily as needed. 107 g 0   aspirin  EC 81 MG tablet Take 81 mg by mouth daily. Swallow whole.     Cholecalciferol (VITAMIN D3) 50 MCG (2000 UT) capsule Take 1 capsule (2,000 Units total) by mouth daily. 100 capsule 1   fluticasone  (FLONASE ) 50 MCG/ACT nasal spray Place 2 sprays into both nostrils daily. 16 g 2   loratadine  (CLARITIN ) 10 MG  tablet Take 1 tablet (10 mg total) by mouth daily. 30 tablet 11   losartan -hydrochlorothiazide  (HYZAAR) 50-12.5 MG tablet Take 1 tablet by mouth daily. 90 tablet 0   metFORMIN  (GLUCOPHAGE -XR) 750 MG 24 hr tablet Take 1 tablet (750 mg total) by mouth daily with breakfast. 90 tablet 1   montelukast  (SINGULAIR ) 10 MG tablet Take 1 tablet (10 mg total) by mouth at bedtime. 90 tablet 1   Semaglutide -Weight Management (WEGOVY ) 0.25 MG/0.5ML SOAJ Inject 0.25 mg into the skin once a week. 2 mL 0   No current facility-administered medications for this visit.    Review of Systems  Constitutional:  Negative for chills, fatigue and fever.  HENT:   Negative for hearing loss and voice change.   Eyes:  Negative for eye problems.  Respiratory:  Negative for chest tightness and cough.   Cardiovascular:  Negative for chest pain.  Gastrointestinal:  Negative for abdominal distention, abdominal pain and blood in stool.  Endocrine: Negative for hot flashes.  Genitourinary:  Negative for difficulty urinating and frequency.   Musculoskeletal:  Negative for arthralgias.  Skin:  Negative for itching and rash.  Neurological:  Negative for extremity weakness.  Hematological:  Negative for adenopathy.  Psychiatric/Behavioral:  Negative for confusion.    PHYSICAL EXAMINATION: ECOG PERFORMANCE STATUS: 0 - Asymptomatic Vitals:   02/13/24 1450  BP: 128/68  Pulse: 72  Resp: 18  Temp: (!) 96.6 F (35.9 C)  SpO2: 100%   Filed Weights   02/13/24 1450  Weight: 235 lb 12.8 oz (107 kg)    Physical Exam Constitutional:      General: She is not in acute distress. HENT:     Head: Normocephalic and atraumatic.  Eyes:     General: No scleral icterus. Cardiovascular:     Rate and Rhythm: Normal rate and regular rhythm.     Heart sounds: Normal heart sounds.  Pulmonary:     Effort: Pulmonary effort is normal. No respiratory distress.     Breath sounds: No wheezing.  Abdominal:     General: Bowel sounds are  normal. There is no distension.  Palpations: Abdomen is soft.  Musculoskeletal:        General: No deformity. Normal range of motion.     Cervical back: Normal range of motion and neck supple.  Skin:    General: Skin is warm and dry.     Findings: No erythema or rash.  Neurological:     Mental Status: She is alert and oriented to person, place, and time. Mental status is at baseline.  Psychiatric:        Mood and Affect: Mood normal.     LABORATORY DATA:  I have reviewed the data as listed    Latest Ref Rng & Units 01/15/2024    3:08 PM 11/20/2023    3:43 PM 08/30/2023    2:38 PM  CBC  WBC 4.0 - 10.5 K/uL 13.7  12.0  14.0   Hemoglobin 12.0 - 15.0 g/dL 04.5  40.9  81.1   Hematocrit 36.0 - 46.0 % 37.3  42.2  44.9   Platelets 150 - 400 K/uL 418  327  418       Latest Ref Rng & Units 11/20/2023    3:43 PM 10/04/2023    3:45 PM 08/30/2023    2:38 PM  CMP  Glucose 65 - 99 mg/dL 914   88   BUN 7 - 25 mg/dL 13   13   Creatinine 7.82 - 1.03 mg/dL 9.56   2.13   Sodium 086 - 146 mmol/L 139   140   Potassium 3.5 - 5.3 mmol/L 3.2   3.8   Chloride 98 - 110 mmol/L 100   102   CO2 20 - 32 mmol/L 26   29   Calcium 8.6 - 10.4 mg/dL 9.3  9.7  57.8   Total Protein 6.1 - 8.1 g/dL 6.8   7.2   Total Bilirubin 0.2 - 1.2 mg/dL 0.4   0.4   AST 10 - 35 U/L 15   14   ALT 6 - 29 U/L 12   11       RADIOGRAPHIC STUDIES: I have personally reviewed the radiological images as listed and agreed with the findings in the report. MR CHEST W WO CONTRAST Result Date: 01/27/2024 CLINICAL DATA:  Mediastinal mass EXAM: MR CHEST WITH AND WITHOUT CONTRAST TECHNIQUE: Multiplanar, multisequence MR imaging of the chest was performed before and after the administration of intravenous contrast. CONTRAST:  10 mL Vueway  IV COMPARISON:  CTA chest dated 11/27/2022 FINDINGS: Triangular anterior mediastinal soft tissue with mild rounded appearance (series 4/image 13), favoring residual thymus, without enhancement  following contrast administration. This appearance is stable dating back to 2023. Heart is normal in size.  No pericardial effusion. No suspicious mediastinal lymphadenopathy. Small left axillary nodes, likely reactive. Mild dependent atelectasis in the bilateral lower lobes. Visualized upper abdomen is grossly unremarkable. 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 Consider follow-up CT chest in 1 year. Electronically Signed   By: Zadie Herter M.D.   On: 01/27/2024 21:18   CT CHEST WO CONTRAST Result Date: 01/22/2024 CLINICAL DATA:  Aneurysm follow-up EXAM: CT CHEST WITHOUT CONTRAST TECHNIQUE: Multidetector CT imaging of the chest was performed following the standard protocol without IV contrast. RADIATION DOSE REDUCTION: This exam was performed according to the departmental dose-optimization program which includes automated exposure control, adjustment of the mA and/or kV according to patient size and/or use of iterative reconstruction technique. COMPARISON:  CT angiogram chest 11/27/2022. FINDINGS: Cardiovascular: Ascending aortic  aneurysm measures 4.1 x 3.5 cm, minimally increased in size from prior. Heart size is upper limits of normal. There is no pericardial effusion. Mediastinum/Nodes: There is a hypodense right thyroid  nodule measuring 11 mm, similar to prior. There are no enlarged mediastinal or hilar lymph nodes. Visualized esophagus is within normal limits. Again noted is likely residual thymic tissue in the anterior mediastinum. Lungs/Pleura: Subpleural nodular densities measuring up to 6 mm on image 4/83 appear unchanged from the prior study. No new pulmonary nodules. No focal lung infiltrate, pleural effusion, or pneumothorax there Upper Abdomen: No acute abnormality. Musculoskeletal: No chest wall mass or suspicious bone lesions identified. IMPRESSION: 1. Ascending aortic aneurysm measuring  4.1 cm, minimally increased in size from prior. Recommend follow-up CT/MR every 12 months and vascular consultation. 2. Stable subpleural nodular densities measuring up to 6 mm. 3. Stable 11 mm right thyroid  nodule. Recommend follow-up nonemergent ultrasound if not previously performed. Electronically Signed   By: Tyron Gallon M.D.   On: 01/22/2024 23:49   DG Chest 2 View Result Date: 11/20/2023 CLINICAL DATA:  Cough, fatigue, and vomiting for several days. EXAM: CHEST - 2 VIEW COMPARISON:  06/21/2022 FINDINGS: The heart size and mediastinal contours are within normal limits. Both lungs are clear. The visualized skeletal structures are unremarkable. IMPRESSION: No active cardiopulmonary disease. Electronically Signed   By: Marlyce Sine M.D.   On: 11/20/2023 19:03

## 2024-02-13 NOTE — Assessment & Plan Note (Signed)
 Lab results were reviewed and discussed with patient. Patient has chronic leukocytosis, predominantly lymphocytosis.  Normal LDH, negative peripheral flowcytometry, negative hepatitis, negative HIV, monoclonal gammopathy workup showed no M protein. Flow cytometry shows increase of both B and T lymphocytes, indicating chronic inflammation. Discussed with patient that leukocytosis is likely reactive, smoking possibly attributes to that. Recommend observation.

## 2024-02-13 NOTE — Assessment & Plan Note (Signed)
 Recommend smoke cessation.

## 2024-03-13 DIAGNOSIS — G4733 Obstructive sleep apnea (adult) (pediatric): Secondary | ICD-10-CM | POA: Diagnosis not present

## 2024-04-12 DIAGNOSIS — G4733 Obstructive sleep apnea (adult) (pediatric): Secondary | ICD-10-CM | POA: Diagnosis not present

## 2024-05-07 ENCOUNTER — Encounter: Payer: Self-pay | Admitting: Hematology and Oncology

## 2024-05-13 DIAGNOSIS — G4733 Obstructive sleep apnea (adult) (pediatric): Secondary | ICD-10-CM | POA: Diagnosis not present

## 2024-05-14 ENCOUNTER — Other Ambulatory Visit: Payer: Self-pay | Admitting: Family Medicine

## 2024-05-14 DIAGNOSIS — R7303 Prediabetes: Secondary | ICD-10-CM

## 2024-05-23 DIAGNOSIS — G4733 Obstructive sleep apnea (adult) (pediatric): Secondary | ICD-10-CM | POA: Diagnosis not present

## 2024-06-03 ENCOUNTER — Telehealth: Payer: Self-pay

## 2024-06-03 NOTE — Telephone Encounter (Signed)
 Copied from CRM [id number]. Topic: General - Other >> Jun 03, 2024 10:06 AM Willma SAUNDERS wrote: Reason for CRM: Patient is looking to get her Flu and Pneumonia Vaccines. Would like to confirm if it is time for her to get them since they were given in December 2024 or is she needs to wait.   Patient can be reached at [phone]

## 2024-06-03 NOTE — Telephone Encounter (Signed)
 Called pt back to advise she only needs the lfu shot and can come in during our nurse visit days Tues-Thur 1-4pm. She will not need pneumonia shot since she already got the most updated one last year.

## 2024-07-03 ENCOUNTER — Other Ambulatory Visit (HOSPITAL_COMMUNITY): Payer: Self-pay

## 2024-07-03 DIAGNOSIS — Z23 Encounter for immunization: Secondary | ICD-10-CM

## 2024-08-13 DIAGNOSIS — G4733 Obstructive sleep apnea (adult) (pediatric): Secondary | ICD-10-CM | POA: Diagnosis not present

## 2024-08-28 NOTE — Patient Instructions (Signed)
 Preventive Care 51-51 Years Old, Female  Preventive care refers to lifestyle choices and visits with your health care provider that can promote health and wellness. Preventive care visits are also called wellness exams.  What can I expect for my preventive care visit?  Counseling  Your health care provider may ask you questions about your:  Medical history, including:  Past medical problems.  Family medical history.  Pregnancy history.  Current health, including:  Menstrual cycle.  Method of birth control.  Emotional well-being.  Home life and relationship well-being.  Sexual activity and sexual health.  Lifestyle, including:  Alcohol, nicotine or tobacco, and drug use.  Access to firearms.  Diet, exercise, and sleep habits.  Work and work Astronomer.  Sunscreen use.  Safety issues such as seatbelt and bike helmet use.  Physical exam  Your health care provider will check your:  Height and weight. These may be used to calculate your BMI (body mass index). BMI is a measurement that tells if you are at a healthy weight.  Waist circumference. This measures the distance around your waistline. This measurement also tells if you are at a healthy weight and may help predict your risk of certain diseases, such as type 2 diabetes and high blood pressure.  Heart rate and blood pressure.  Body temperature.  Skin for abnormal spots.  What immunizations do I need?    Vaccines are usually given at various ages, according to a schedule. Your health care provider will recommend vaccines for you based on your age, medical history, and lifestyle or other factors, such as travel or where you work.  What tests do I need?  Screening  Your health care provider may recommend screening tests for certain conditions. This may include:  Lipid and cholesterol levels.  Diabetes screening. This is done by checking your blood sugar (glucose) after you have not eaten for a while (fasting).  Pelvic exam and Pap test.  Hepatitis B test.  Hepatitis C  test.  HIV (human immunodeficiency virus) test.  STI (sexually transmitted infection) testing, if you are at risk.  Lung cancer screening.  Colorectal cancer screening.  Mammogram. Talk with your health care provider about when you should start having regular mammograms. This may depend on whether you have a family history of breast cancer.  BRCA-related cancer screening. This may be done if you have a family history of breast, ovarian, tubal, or peritoneal cancers.  Bone density scan. This is done to screen for osteoporosis.  Talk with your health care provider about your test results, treatment options, and if necessary, the need for more tests.  Follow these instructions at home:  Eating and drinking    Eat a diet that includes fresh fruits and vegetables, whole grains, lean protein, and low-fat dairy products.  Take vitamin and mineral supplements as recommended by your health care provider.  Do not drink alcohol if:  Your health care provider tells you not to drink.  You are pregnant, may be pregnant, or are planning to become pregnant.  If you drink alcohol:  Limit how much you have to 0-1 drink a day.  Know how much alcohol is in your drink. In the [place] one drink equals one 12 oz bottle of beer (355 mL), one 5 oz glass of wine (148 mL), or one 1 oz glass of hard liquor (44 mL).  Lifestyle  Brush your teeth every morning and night with fluoride toothpaste. Floss one time each day.  Exercise for at least  30 minutes 5 or more days each week.  Do not use any products that contain nicotine or tobacco. These products include cigarettes, chewing tobacco, and vaping devices, such as e-cigarettes. If you need help quitting, ask your health care provider.  Do not use drugs.  If you are sexually active, practice safe sex. Use a condom or other form of protection to prevent STIs.  If you do not wish to become pregnant, use a form of birth control. If you plan to become pregnant, see your health care provider for a  prepregnancy visit.  Take aspirin only as told by your health care provider. Make sure that you understand how much to take and what form to take. Work with your health care provider to find out whether it is safe and beneficial for you to take aspirin daily.  Find healthy ways to manage stress, such as:  Meditation, yoga, or listening to music.  Journaling.  Talking to a trusted person.  Spending time with friends and family.  Minimize exposure to UV radiation to reduce your risk of skin cancer.  Safety  Always wear your seat belt while driving or riding in a vehicle.  Do not drive:  If you have been drinking alcohol. Do not ride with someone who has been drinking.  When you are tired or distracted.  While texting.  If you have been using any mind-altering substances or drugs.  Wear a helmet and other protective equipment during sports activities.  If you have firearms in your house, make sure you follow all gun safety procedures.  Seek help if you have been physically or sexually abused.  What's next?  Visit your health care provider once a year for an annual wellness visit.  Ask your health care provider how often you should have your eyes and teeth checked.  Stay up to date on all vaccines.  This information is not intended to replace advice given to you by your health care provider. Make sure you discuss any questions you have with your health care provider.  Document Revised: 03/01/2021 Document Reviewed: 03/01/2021  Elsevier Patient Education  2024 [employer].

## 2024-08-31 ENCOUNTER — Other Ambulatory Visit (HOSPITAL_COMMUNITY)
Admission: RE | Admit: 2024-08-31 | Discharge: 2024-08-31 | Disposition: A | Source: Ambulatory Visit | Attending: Family Medicine | Admitting: Family Medicine

## 2024-08-31 ENCOUNTER — Ambulatory Visit (INDEPENDENT_AMBULATORY_CARE_PROVIDER_SITE_OTHER): Payer: Self-pay | Admitting: Family Medicine

## 2024-08-31 VITALS — BP 134/74 | HR 71 | Resp 16 | Ht 63.0 in | Wt 243.4 lb

## 2024-08-31 DIAGNOSIS — R7303 Prediabetes: Secondary | ICD-10-CM

## 2024-08-31 DIAGNOSIS — Z113 Encounter for screening for infections with a predominantly sexual mode of transmission: Secondary | ICD-10-CM | POA: Diagnosis not present

## 2024-08-31 DIAGNOSIS — E559 Vitamin D deficiency, unspecified: Secondary | ICD-10-CM

## 2024-08-31 DIAGNOSIS — D72829 Elevated white blood cell count, unspecified: Secondary | ICD-10-CM | POA: Diagnosis not present

## 2024-08-31 DIAGNOSIS — E785 Hyperlipidemia, unspecified: Secondary | ICD-10-CM

## 2024-08-31 DIAGNOSIS — R350 Frequency of micturition: Secondary | ICD-10-CM | POA: Diagnosis not present

## 2024-08-31 DIAGNOSIS — E538 Deficiency of other specified B group vitamins: Secondary | ICD-10-CM | POA: Diagnosis not present

## 2024-08-31 DIAGNOSIS — I10 Essential (primary) hypertension: Secondary | ICD-10-CM

## 2024-08-31 DIAGNOSIS — Z0000 Encounter for general adult medical examination without abnormal findings: Secondary | ICD-10-CM

## 2024-08-31 DIAGNOSIS — Z1159 Encounter for screening for other viral diseases: Secondary | ICD-10-CM

## 2024-08-31 DIAGNOSIS — Z0001 Encounter for general adult medical examination with abnormal findings: Secondary | ICD-10-CM

## 2024-08-31 DIAGNOSIS — Z1231 Encounter for screening mammogram for malignant neoplasm of breast: Secondary | ICD-10-CM

## 2024-08-31 NOTE — Progress Notes (Signed)
 Name: Jill Davidson   MRN: [id number]    DOB: 12/13/1970   Date:08/31/2024       Progress Note  Subjective  Chief Complaint  Chief Complaint  Patient presents with   Annual Exam    HPI  Patient presents for annual CPE.  Diet: eats breakfast and one more meal a day  Exercise: discussed importance of regular physical activity   Last Eye Exam: encouraged to complete Last Dental Exam: encouraged to complete  Flowsheet Row Office Visit from 08/31/2024 in [facility]  AUDIT-C Score 3    Depression: Phq 9 is  negative    08/31/2024    2:02 PM 10/04/2023    3:01 PM 08/30/2023    2:10 PM 07/02/2023    1:46 PM 02/12/2023    1:52 PM  Depression screen PHQ 2/9  Decreased Interest 0 0 0 0 0  Down, Depressed, Hopeless 0 0 0 0 0  PHQ - 2 Score 0 0 0 0 0  Altered sleeping  0     Tired, decreased energy  0     Change in appetite  0     Feeling bad or failure about yourself   0     Trouble concentrating  0     Moving slowly or fidgety/restless  0     Suicidal thoughts  0     PHQ-9 Score  0      Difficult doing work/chores  Not difficult at all        Data saved with a previous flowsheet row definition   Hypertension: BP Readings from Last 3 Encounters:  08/31/24 134/74  02/13/24 128/68  01/20/24 130/86   Obesity: Wt Readings from Last 3 Encounters:  08/31/24 243 lb 6.4 oz (110.4 kg)  02/13/24 235 lb 12.8 oz (107 kg)  01/20/24 231 lb 9.6 oz (105.1 kg)   BMI Readings from Last 3 Encounters:  08/31/24 43.12 kg/m  02/13/24 41.77 kg/m  01/20/24 41.03 kg/m     Vaccines: reviewed with the patient.   Hep C Screening: completed STD testing and prevention (HIV/chl/gon/syphilis): she wants to be checked  Intimate partner violence: negative screen  Sexual History : same sexual partner, but condom broke about 6 weeks ago  Menstrual History/LMP/Abnormal Bleeding: s/p hysterectomy  Discussed importance of follow up if any post-menopausal bleeding:  not applicable  Incontinence Symptoms: positive for symptoms , urinary frequency   Breast cancer:  - Last Mammogram: needs to schedule it  - BRCA gene screening:   Osteoporosis Prevention : Discussed high calcium and vitamin D  supplementation, weight bearing exercises Bone density :yes   Cervical cancer screening: not applicable due to hysterectomy  Skin cancer: Discussed monitoring for atypical lesions  Colorectal cancer:   up to date Lung cancer:  Low Dose CT Chest recommended if Age 74-80 years, 20 pack-year currently smoking OR have quit w/in 15years. Patient does not qualify for screen   ECG: 2024   Advanced Care Planning: A voluntary discussion about advance care planning including the explanation and discussion of advance directives.  Discussed health care proxy and Living will, and the patient was able to identify a health care proxy as mother .  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   OSA (obstructive sleep apnea) 10/04/2023   Seasonal allergies 10/04/2023   Thyroid  nodule 10/04/2023   Vitamin D   deficiency 10/04/2023   Pre-diabetes 10/04/2023   Leukocytosis 10/04/2023   Hypercalcemia 10/04/2023   Dyslipidemia 08/14/2023   Atypical chest pain 07/11/2023   Polyp of ascending colon 11/26/2022   Rectal polyp 11/26/2022   Leg cramp 09/21/2022   Aneurysm of ascending aorta without rupture 04/10/2022   Palpitations 04/10/2022   Abnormal CT scan of lung 04/10/2022   Tobacco user 07/24/2018   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) 06/05/2018   Vasomotor symptoms due to menopause 05/06/2018   Hypertension, benign 07/25/2016   History of iron  deficiency anemia 01/25/2015    Past Surgical History:  Procedure Laterality Date   ABDOMINAL HYSTERECTOMY     CESAREAN SECTION     X2    COLONOSCOPY WITH PROPOFOL  N/A 11/26/2022   Procedure: COLONOSCOPY WITH PROPOFOL ;  Surgeon: Unk Corinn Skiff, MD;  Location: [facility];  Service: Gastroenterology;  Laterality: N/A;   DILATION AND CURETTAGE OF UTERUS     DILITATION & CURRETTAGE/HYSTROSCOPY WITH NOVASURE ABLATION N/A 07/23/2016   Procedure: DILATATION & CURETTAGE/HYSTEROSCOPY WITH NOVASURE ABLATION;  Surgeon: Gladis DELENA Dollar, MD;  Location: [facility] ORS;  Service: Gynecology;  Laterality: N/A;   LAPAROSCOPIC VAGINAL HYSTERECTOMY WITH SALPINGECTOMY Bilateral 03/24/2018   Procedure: LAPAROSCOPIC ASSISTED VAGINAL HYSTERECTOMY WITH BILATERAL SALPINGECTOMY;  Surgeon: Dollar Gladis DELENA, MD;  Location: [facility] ORS;  Service: Gynecology;  Laterality: Bilateral;    Family History  Problem Relation Age of Onset   Heart failure Mother    Hypertension Mother    Thyroid  disease Mother    Lung cancer Mother    Asthma Mother    Peripheral Artery Disease Mother        stents in legs   Diabetes Father    Hypertension Father    Hypertension Brother    Heart failure Maternal Aunt    Breast cancer Maternal Aunt        great   Crohn's disease Maternal Aunt    Heart disease Maternal Aunt    Diabetes Maternal Grandmother    Cerebral aneurysm Maternal Grandmother    ADD / ADHD Son    Ovarian cancer Neg Hx     Social History   Socioeconomic History   Marital status: Single    Spouse name: Not on file   Number of children: Not on file   Years of education: Not on file   Highest education level: Some college, no degree  Occupational History   Not on file  Tobacco Use   Smoking status: Every Day    Current packs/day: 0.25    Average packs/day: 0.5 packs/day for 32.8 years (16.1 ttl pk-yrs)    Types: Cigarettes    Start date: 07/03/1992   Smokeless tobacco: Never  Vaping Use   Vaping status: Never Used  Substance and Sexual Activity   Alcohol use: Yes    Alcohol/week: 3.0 standard drinks of alcohol    Types: 3 Shots  of liquor per week    Comment: occasional 1 drink every few weeks   Drug use: Not Currently    Types: Marijuana   Sexual activity: Yes    Birth control/protection: Condom, Surgical    Comment: Ablation  Other Topics Concern   Not on file  Social History Narrative   She is a producer, television/film/video, 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, 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    Mother moved in with her   Social Drivers of  Health   Tobacco Use: High Risk (08/31/2024)   Patient History    Smoking Tobacco Use: Every Day    Smokeless Tobacco Use: Never    Passive Exposure: Not on file  Financial Resource Strain: Low Risk (08/31/2024)   Overall Financial Resource Strain (CARDIA)    Difficulty of Paying Living Expenses: Not very hard  Food Insecurity: Food Insecurity Present (08/31/2024)   Epic    Worried About Programme Researcher, Broadcasting/film/video in the Last Year: Never true    Ran Out of Food in the Last Year: Sometimes true  Transportation Needs: No Transportation Needs (08/31/2024)   Epic    Lack of Transportation (Medical): No    Lack of Transportation (Non-Medical): No  Physical Activity: Insufficiently Active (08/31/2024)   Exercise Vital Sign    Days of Exercise per Week: 7 days    Minutes of Exercise per Session: 20 min  Stress: No Stress Concern Present (08/31/2024)   [employer] of Occupational Health - Occupational Stress Questionnaire    Feeling of Stress: Not at all  Social Connections: Moderately Isolated (08/31/2024)   Social Connection and Isolation Panel    Frequency of Communication with Friends and Family: More than three times a week    Frequency of Social Gatherings with Friends and Family: Once a week    Attends Religious Services: 1 to 4 times per year    Active Member of [employer] or Organizations: No    Attends Engineer, Structural: Not on file    Marital Status: Never married  Intimate Partner Violence: Not At Risk (08/30/2023)   Humiliation, Afraid, Rape, and  Kick questionnaire    Fear of Current or Ex-Partner: No    Emotionally Abused: No    Physically Abused: No    Sexually Abused: No  Depression (PHQ2-9): Low Risk (08/31/2024)   Depression (PHQ2-9)    PHQ-2 Score: 0  Alcohol Screen: Low Risk (08/31/2024)   Alcohol Screen    Last Alcohol Screening Score (AUDIT): 3  Housing: Low Risk (08/31/2024)   Epic    Unable to Pay for Housing in the Last Year: No    Number of Times Moved in the Last Year: 0    Homeless in the Last Year: No  Utilities: Not At Risk (12/11/2023)   Received from [facility]    Threatened with loss of utilities: No  Health Literacy: Adequate Health Literacy (08/31/2024)   B1300 Health Literacy    Frequency of need for help with medical instructions: Never    Current Medications[1]  Allergies[2]   ROS  Constitutional: Negative for fever or weight change.  Respiratory:positive for occasional cough but no shortness of breath.   Cardiovascular: positive for chest discomfort in the mornings, has not tried PPI yet, no  palpitations.  Gastrointestinal: Negative for abdominal pain, no bowel changes.  Musculoskeletal: Negative for gait problem or joint swelling.  Skin: Negative for rash.  Neurological: Negative for dizziness or headache.  No other specific complaints in a complete review of systems (except as listed in HPI above).   Objective  Vitals:   08/31/24 1407  BP: 134/74  Pulse: 71  Resp: 16  SpO2: 97%  Weight: 243 lb 6.4 oz (110.4 kg)  Height: 5' 3 (1.6 m)    Body mass index is 43.12 kg/m.  Physical Exam  Constitutional: Patient appears well-developed and well-nourished. No distress.  HENT: Head: Normocephalic and atraumatic. Ears: B TMs ok, no erythema or effusion;  Nose: Nose normal. Mouth/Throat: Oropharynx is clear and moist. No oropharyngeal exudate.  Eyes: Conjunctivae and EOM are normal. Pupils are equal, round, and reactive to light. No scleral icterus.   Neck: Normal range of motion. Neck supple. No JVD present. No thyromegaly present.  Cardiovascular: Normal rate, regular rhythm and normal heart sounds.  No murmur heard. No BLE edema. Pulmonary/Chest: Effort normal and breath sounds normal. No respiratory distress. Abdominal: Soft. Bowel sounds are normal, no distension. There is no tenderness. no masses Breast: no lumps or masses, no nipple discharge or rashes FEMALE GENITALIA:  Not done  RECTAL: not done  Musculoskeletal: Normal range of motion, no joint effusions. No gross deformities Neurological: he is alert and oriented to person, place, and time. No cranial nerve deficit. Coordination, balance, strength, speech and gait are normal.  Skin: Skin is warm and dry. No rash noted. No erythema.  Psychiatric: Patient has a normal mood and affect. behavior is normal. Judgment and thought content normal.      Assessment & Plan  Well adult exam Routine health maintenance due. Mammogram and bone density scan needed. Eye and dental exams not up to date. Smoking cessation advised. - Ordered mammogram. - Ordered bone density scan. - Advised scheduling eye and dental exams. - Encouraged smoking cessation. - Administered shingles vaccine. - Ordered cervical cancer screening. - Ordered HIV and syphilis tests.  Essential hypertension Blood pressure slightly elevated at 134/74 mmHg. Running low on losartan . - Scheduled virtual visit for lab results and prescription refill. - Continue losartan .  Prediabetes Previous A1c indicated prediabetes. Monitoring necessary. - Ordered A1c test.  Dyslipidemia Previous lipid panel showed low HDL, elevated triglycerides, and elevated LDL. Monitoring necessary. - Ordered lipid panel.  Vitamin D  deficiency Previous deficiency. Not started OTC supplementation. - Ordered vitamin D  level test. - Recommended over-the-counter vitamin D  supplementation.  Hypercalcemia Previous calcium level slightly  elevated. Monitoring necessary. - Ordered calcium level test. - Ordered parathyroid hormone test.  Leukocytosis Previous elevated white blood cell count. Monitoring necessary.  Folic acid  deficiency Previous low folic acid  level. Monitoring necessary. - Ordered folic acid  level test.  Urinary frequency Reports frequent urination without burning. No current UTI signs. - Ordered urine culture.  Obstructive sleep apnea Continues CPAP use.  Thyroid  nodule Previous fine needle biopsy performed. Needs specialist follow-up for results. - Advised follow-up with specialist for thyroid  biopsy results.      -USPSTF grade A and B recommendations reviewed with patient; age-appropriate recommendations, preventive care, screening tests, etc discussed and encouraged; healthy living encouraged; see AVS for patient education given to patient -Discussed importance of 150 minutes of physical activity weekly, eat two servings of fish weekly, eat one serving of tree nuts ( cashews, pistachios, pecans, almonds.SABRA) every other day, eat 6 servings of fruit/vegetables daily and drink plenty of water and avoid sweet beverages.   -Reviewed Health Maintenance: Yes.       [1]  Current Outpatient Medications:    Albuterol -Budesonide (AIRSUPRA ) 90-80 MCG/ACT AERO, Inhale 2 puffs into the lungs 4 (four) times daily as needed., Disp: 107 g, Rfl: 0   aspirin  EC 81 MG tablet, Take 81 mg by mouth daily. Swallow whole., Disp: , Rfl:    Cholecalciferol (VITAMIN D3) 50 MCG (2000 UT) capsule, Take 1 capsule (2,000 Units total) by mouth daily., Disp: 100 capsule, Rfl: 1   fluticasone  (FLONASE ) 50 MCG/ACT nasal spray, Place 2 sprays into both nostrils daily., Disp: 16 g, Rfl: 2   loratadine  (CLARITIN ) 10 MG tablet, Take  1 tablet (10 mg total) by mouth daily., Disp: 30 tablet, Rfl: 11   losartan -hydrochlorothiazide  (HYZAAR) 50-12.5 MG tablet, Take 1 tablet by mouth daily., Disp: 90 tablet, Rfl: 0   metFORMIN   (GLUCOPHAGE -XR) 750 MG 24 hr tablet, Take 1 tablet by mouth once daily with breakfast, Disp: 90 tablet, Rfl: 0   montelukast  (SINGULAIR ) 10 MG tablet, Take 1 tablet (10 mg total) by mouth at bedtime., Disp: 90 tablet, Rfl: 1 [2]  Allergies Allergen Reactions   Hepatitis B Virus Vaccines     Dysarthria

## 2024-09-01 ENCOUNTER — Other Ambulatory Visit: Payer: Self-pay | Admitting: Family Medicine

## 2024-09-01 DIAGNOSIS — R7303 Prediabetes: Secondary | ICD-10-CM

## 2024-09-01 LAB — HEPATITIS B SURFACE ANTIBODY,QUALITATIVE: Hep B S Ab: REACTIVE — AB

## 2024-09-02 ENCOUNTER — Ambulatory Visit: Payer: Self-pay | Admitting: Family Medicine

## 2024-09-02 LAB — CERVICOVAGINAL ANCILLARY ONLY
Bacterial Vaginitis (gardnerella): POSITIVE — AB
Candida Glabrata: NEGATIVE
Candida Vaginitis: NEGATIVE
Chlamydia: NEGATIVE
Comment: NEGATIVE
Comment: NEGATIVE
Comment: NEGATIVE
Comment: NEGATIVE
Comment: NEGATIVE
Comment: NORMAL
Neisseria Gonorrhea: NEGATIVE
Trichomonas: NEGATIVE

## 2024-09-02 MED ORDER — CIPROFLOXACIN HCL 250 MG PO TABS: 250.0000 mg | ORAL_TABLET | Freq: Two times a day (BID) | ORAL | 0 refills | Status: AC

## 2024-09-03 LAB — CULTURE, URINE COMPREHENSIVE
MICRO NUMBER:: 17356539
SPECIMEN QUALITY:: ADEQUATE

## 2024-09-03 LAB — CBC WITH DIFFERENTIAL/PLATELET
Absolute Lymphocytes: 5922 {cells}/uL — ABNORMAL HIGH (ref 850–3900)
Absolute Monocytes: 649 {cells}/uL (ref 200–950)
Basophils Absolute: 71 {cells}/uL (ref 0–200)
Basophils Relative: 0.5 %
Eosinophils Absolute: 127 {cells}/uL (ref 15–500)
Eosinophils Relative: 0.9 %
HCT: 39.9 % (ref 35.9–46.0)
Hemoglobin: 13.3 g/dL (ref 11.7–15.5)
MCH: 29.2 pg (ref 27.0–33.0)
MCHC: 33.3 g/dL (ref 31.6–35.4)
MCV: 87.7 fL (ref 81.4–101.7)
MPV: 9.8 fL (ref 7.5–12.5)
Monocytes Relative: 4.6 %
Neutro Abs: 7332 {cells}/uL (ref 1500–7800)
Neutrophils Relative %: 52 %
Platelets: 449 Thousand/uL — ABNORMAL HIGH (ref 140–400)
RBC: 4.55 Million/uL (ref 3.80–5.10)
RDW: 13 % (ref 11.0–15.0)
Total Lymphocyte: 42 %
WBC: 14.1 Thousand/uL — ABNORMAL HIGH (ref 3.8–10.8)

## 2024-09-03 LAB — HEMOGLOBIN A1C
Hgb A1c MFr Bld: 5.9 % — ABNORMAL HIGH (ref <5.7)
Mean Plasma Glucose: 123 mg/dL
eAG (mmol/L): 6.8 mmol/L

## 2024-09-03 LAB — HIV ANTIBODY (ROUTINE TESTING W REFLEX)
HIV 1&2 Ab, 4th Generation: NONREACTIVE
HIV FINAL INTERPRETATION: NEGATIVE

## 2024-09-03 LAB — COMPREHENSIVE METABOLIC PANEL WITH GFR
AG Ratio: 1.4 (calc) (ref 1.0–2.5)
ALT: 15 U/L (ref 6–29)
AST: 13 U/L (ref 10–35)
Albumin: 4.4 g/dL (ref 3.6–5.1)
Alkaline phosphatase (APISO): 68 U/L (ref 37–153)
BUN: 11 mg/dL (ref 7–25)
CO2: 29 mmol/L (ref 20–32)
Calcium: 9.8 mg/dL (ref 8.6–10.4)
Chloride: 100 mmol/L (ref 98–110)
Creat: 0.64 mg/dL (ref 0.50–1.03)
Globulin: 3.2 g/dL (ref 1.9–3.7)
Glucose, Bld: 89 mg/dL (ref 65–99)
Potassium: 3.6 mmol/L (ref 3.5–5.3)
Sodium: 140 mmol/L (ref 135–146)
Total Bilirubin: 0.3 mg/dL (ref 0.2–1.2)
Total Protein: 7.6 g/dL (ref 6.1–8.1)
eGFR: 107 mL/min/1.73m2 (ref >=60)

## 2024-09-03 LAB — TSH: TSH: 1.29 m[IU]/L

## 2024-09-03 LAB — LIPID PANEL
Cholesterol: 172 mg/dL (ref <200)
HDL: 43 mg/dL — ABNORMAL LOW (ref >=50)
LDL Cholesterol (Calc): 108 mg/dL — ABNORMAL HIGH
Non-HDL Cholesterol (Calc): 129 mg/dL (ref <130)
Total CHOL/HDL Ratio: 4 (calc) (ref <5.0)
Triglycerides: 115 mg/dL (ref <150)

## 2024-09-03 LAB — PTH, INTACT AND CALCIUM
Calcium: 9.8 mg/dL (ref 8.6–10.4)
PTH: 27 pg/mL (ref 16–77)

## 2024-09-03 LAB — B12 AND FOLATE PANEL
Folate: 7.4 ng/mL
Vitamin B-12: 475 pg/mL (ref 200–1100)

## 2024-09-03 LAB — SYPHILIS: RPR W/REFLEX TO RPR TITER AND TREPONEMAL ANTIBODIES, TRADITIONAL SCREENING AND DIAGNOSIS ALGORITHM: RPR Ser Ql: NONREACTIVE

## 2024-09-03 LAB — VITAMIN D 25 HYDROXY (VIT D DEFICIENCY, FRACTURES): Vit D, 25-Hydroxy: 24 ng/mL — ABNORMAL LOW (ref 30–100)

## 2024-09-03 NOTE — Telephone Encounter (Signed)
 Requested Prescriptions  Pending Prescriptions Disp Refills   metFORMIN  (GLUCOPHAGE -XR) 750 MG 24 hr tablet [Pharmacy Med Name: metFORMIN  HCl ER 750 MG Oral Tablet Extended Release 24 Hour] 90 tablet 0    Sig: Take 1 tablet by mouth once daily with breakfast     Endocrinology:  Diabetes - Biguanides Failed - 09/03/2024  6:04 PM      Failed - CBC within normal limits and completed in the last 12 months    WBC  Date Value Ref Range Status  08/31/2024 14.1 (H) 3.8 - 10.8 Thousand/uL Final   RBC  Date Value Ref Range Status  08/31/2024 4.55 3.80 - 5.10 Million/uL Final   Hemoglobin  Date Value Ref Range Status  08/31/2024 13.3 11.7 - 15.5 g/dL Final  96/80/7979 85.5 12.0 - 15.0 g/dL Final   HGB  Date Value Ref Range Status  08/02/2017 11.5 (L) 11.6 - 15.9 g/dL Final   HCT  Date Value Ref Range Status  08/31/2024 39.9 35.9 - 46.0 % Final  08/02/2017 35.8 34.8 - 46.6 % Final   MCHC  Date Value Ref Range Status  08/31/2024 33.3 31.6 - 35.4 g/dL Final   [facility]  Date Value Ref Range Status  08/31/2024 29.2 27.0 - 33.0 pg Final   MCV  Date Value Ref Range Status  08/31/2024 87.7 81.4 - 101.7 fL Final  08/02/2017 83.4 79.5 - 101.0 fL Final   No results found for: PLTCOUNTKUC, LABPLAT, POCPLA RDW  Date Value Ref Range Status  08/31/2024 13.0 11.0 - 15.0 % Final  08/02/2017 16.5 (H) 11.2 - 14.5 % Final         Passed - Cr in normal range and within 360 days    Creatinine  Date Value Ref Range Status  08/02/2017 0.7 0.6 - 1.1 mg/dL Final   Creat  Date Value Ref Range Status  08/31/2024 0.64 0.50 - 1.03 mg/dL Final   Creatinine, Urine  Date Value Ref Range Status  09/05/2020 244 20 - 275 mg/dL Final         Passed - HBA1C is between 0 and 7.9 and within 180 days    Hgb A1c MFr Bld  Date Value Ref Range Status  08/31/2024 5.9 (H) <5.7 % Final    Comment:    For someone without known diabetes, a hemoglobin  A1c value between 5.7% and 6.4% is consistent  with prediabetes and should be confirmed with a  follow-up test. . For someone with known diabetes, a value <7% indicates that their diabetes is well controlled. A1c targets should be individualized based on duration of diabetes, age, comorbid conditions, and other considerations. . This assay result is consistent with an increased risk of diabetes. . Currently, no consensus exists regarding use of hemoglobin A1c for diagnosis of diabetes for children. .          Passed - eGFR in normal range and within 360 days    GFR, Est African American  Date Value Ref Range Status  09/05/2020 121 > OR = 60 mL/min/1.33m2 Final   GFR, Est Non African American  Date Value Ref Range Status  09/05/2020 105 > OR = 60 mL/min/1.82m2 Final   GFR, Estimated  Date Value Ref Range Status  09/20/2022 >60 >60 mL/min Final    Comment:    (NOTE) Calculated using the CKD-EPI Creatinine Equation (2021)    eGFR  Date Value Ref Range Status  08/31/2024 107 > OR = 60 mL/min/1.55m2 Final  Passed - B12 Level in normal range and within 720 days    Vitamin B12  Date Value Ref Range Status  08/02/2017 509 232 - 1,245 pg/mL Final   Vitamin B-12  Date Value Ref Range Status  08/31/2024 475 200 - 1,100 pg/mL Final         Passed - Valid encounter within last 6 months    Recent Outpatient Visits           3 days ago Well adult exam   [facility] Glenard Mire, MD   8 months ago OSA on CPAP   [facility] Glenard Mire, MD   9 months ago Cough, unspecified type   [facility] Glenard Mire, MD   9 months ago Cough, unspecified type   [facility] Sowles, Krichna, MD

## 2024-09-14 ENCOUNTER — Ambulatory Visit: Admitting: Family Medicine

## 2024-09-14 ENCOUNTER — Encounter: Payer: Self-pay | Admitting: Family Medicine

## 2024-09-14 ENCOUNTER — Telehealth: Admitting: Family Medicine

## 2024-09-14 DIAGNOSIS — I10 Essential (primary) hypertension: Secondary | ICD-10-CM

## 2024-09-14 DIAGNOSIS — J4520 Mild intermittent asthma, uncomplicated: Secondary | ICD-10-CM

## 2024-09-14 DIAGNOSIS — F17210 Nicotine dependence, cigarettes, uncomplicated: Secondary | ICD-10-CM | POA: Diagnosis not present

## 2024-09-14 DIAGNOSIS — R7303 Prediabetes: Secondary | ICD-10-CM | POA: Diagnosis not present

## 2024-09-14 DIAGNOSIS — E559 Vitamin D deficiency, unspecified: Secondary | ICD-10-CM

## 2024-09-14 DIAGNOSIS — G4733 Obstructive sleep apnea (adult) (pediatric): Secondary | ICD-10-CM

## 2024-09-14 DIAGNOSIS — J301 Allergic rhinitis due to pollen: Secondary | ICD-10-CM

## 2024-09-14 MED ORDER — METFORMIN HCL ER 750 MG PO TB24: 750.0000 mg | ORAL_TABLET | Freq: Every day | ORAL | 0 refills | Status: AC

## 2024-09-14 MED ORDER — MONTELUKAST SODIUM 10 MG PO TABS: 10.0000 mg | ORAL_TABLET | Freq: Every day | ORAL | 1 refills | Status: AC

## 2024-09-14 MED ORDER — LOSARTAN POTASSIUM-HCTZ 50-12.5 MG PO TABS: 1.0000 | ORAL_TABLET | Freq: Every day | ORAL | 0 refills | Status: AC

## 2024-09-14 NOTE — Progress Notes (Signed)
 "  Name: Jill Davidson   MRN: [id number]    DOB: June 24, 1971   Date:09/14/2024       Progress Note  Subjective  Chief Complaint  Chief Complaint  Patient presents with   intercourse pain    I connected with  Jill Davidson  on 09/14/2024 at  3:20 PM EST by a video enabled telemedicine application and verified that I am speaking with the correct person using two identifiers.  I discussed the limitations of evaluation and management by telemedicine and the availability of in person appointments. The patient expressed understanding and agreed to proceed with the virtual visit  Staff also discussed with the patient that there may be a patient responsible charge related to this service. Patient Location: at home  Provider Location: [facility] Additional Individuals present: alone   Discussed the use of AI scribe software for clinical note transcription with the patient, who gave verbal consent to proceed.  History of Present Illness [name] is a 53 year old female with hypertension and prediabetes who presents for medication refills and review of lab results.  She had a wellness exam on December 15th, during which blood work was performed. She is currently taking losartan  HCTZ 50/12.5 mg for hypertension and metformin  for prediabetes. Her blood pressure readings have been variable, with a recent reading of 134/74 mmHg, which was higher than a previous reading of 128/68 mmHg. She reports no side effects from her blood pressure medication.  She occasionally experiences a dull ache in her chest that lasts for one to two hours, which she describes as an uncomfortable feeling rather than pain. She denies chest pain, palpitations, heartburn, or indigestion. She has not taken any medication for reflux. She thought her symptoms might be related to acid reflux.  Regarding her prediabetes, her A1c has improved from 6.1% to 5.9% over the past year. She is trying to avoid sweets and engage in physical  activity to manage her condition.  She has a history of vitamin D  deficiency, with levels improving from 17 to 24 ng/mL, though still below the desired level. She has not been taking vitamin D  supplements recently.  Her asthma is managed with montelukast , loratadine , and fluticasone , and she has an inhaler (Airsupra ) available. She uses these medications as needed, primarily when experiencing symptoms like a runny nose.  She recently completed a course of Cipro  for a bladder infection. Post-treatment, she experienced discomfort during intercourse but reports no current symptoms such as burning or urinary issues. She was previously diagnosed with bacterial vaginosis but has no current symptoms of discharge or odor.  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   OSA (obstructive sleep apnea) 10/04/2023   Seasonal allergies 10/04/2023   Thyroid  nodule 10/04/2023   Vitamin D  deficiency 10/04/2023   Pre-diabetes 10/04/2023   Leukocytosis 10/04/2023   Hypercalcemia 10/04/2023   Dyslipidemia 08/14/2023   Atypical chest pain 07/11/2023   Polyp of ascending colon 11/26/2022   Rectal polyp 11/26/2022   Leg cramp 09/21/2022   Aneurysm of ascending aorta without rupture 04/10/2022   Palpitations 04/10/2022   Abnormal CT scan of lung 04/10/2022   Tobacco user 07/24/2018   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) 06/05/2018   Vasomotor symptoms due to menopause 05/06/2018  Hypertension, benign 07/25/2016   History of iron  deficiency anemia 01/25/2015    Past Surgical History:  Procedure Laterality Date   ABDOMINAL HYSTERECTOMY     CESAREAN SECTION     X2   COLONOSCOPY WITH PROPOFOL  N/A 11/26/2022   Procedure: COLONOSCOPY WITH PROPOFOL ;  Surgeon: Unk Corinn Skiff, MD;  Location: [facility]  ENDOSCOPY;  Service: Gastroenterology;  Laterality: N/A;   DILATION AND CURETTAGE OF UTERUS     DILITATION & CURRETTAGE/HYSTROSCOPY WITH NOVASURE ABLATION N/A 07/23/2016   Procedure: DILATATION & CURETTAGE/HYSTEROSCOPY WITH NOVASURE ABLATION;  Surgeon: Gladis DELENA Dollar, MD;  Location: [facility] ORS;  Service: Gynecology;  Laterality: N/A;   LAPAROSCOPIC VAGINAL HYSTERECTOMY WITH SALPINGECTOMY Bilateral 03/24/2018   Procedure: LAPAROSCOPIC ASSISTED VAGINAL HYSTERECTOMY WITH BILATERAL SALPINGECTOMY;  Surgeon: Dollar Gladis DELENA, MD;  Location: [facility] ORS;  Service: Gynecology;  Laterality: Bilateral;    Family History  Problem Relation Age of Onset   Heart failure Mother    Hypertension Mother    Thyroid  disease Mother    Lung cancer Mother    Asthma Mother    Peripheral Artery Disease Mother        stents in legs   Diabetes Father    Hypertension Father    Hypertension Brother    Heart failure Maternal Aunt    Breast cancer Maternal Aunt        great   Crohn's disease Maternal Aunt    Heart disease Maternal Aunt    Diabetes Maternal Grandmother    Cerebral aneurysm Maternal Grandmother    ADD / ADHD Son    Ovarian cancer Neg Hx     Social History   Socioeconomic History   Marital status: Single    Spouse name: Not on file   Number of children: Not on file   Years of education: Not on file   Highest education level: Some college, no degree  Occupational History   Not on file  Tobacco Use   Smoking status: Every Day    Current packs/day: 0.25    Average packs/day: 0.5 packs/day for 32.2 years (15.8 ttl pk-yrs)    Types: Cigarettes    Start date: 07/03/1992   Smokeless tobacco: Never  Vaping Use   Vaping status: Never Used  Substance and Sexual Activity   Alcohol use: Yes    Alcohol/week: 3.0 standard drinks of alcohol    Types: 3 Shots of liquor per week    Comment: occasional 1 drink every few weeks   Drug use: Not Currently    Types: Marijuana   Sexual  activity: Yes    Birth control/protection: Condom, Surgical    Comment: Ablation  Other Topics Concern   Not on file  Social History Narrative   She is a producer, television/film/video, 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, 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    Mother moved in with her   Social Drivers of Health   Tobacco Use: High Risk (09/14/2024)   Patient History    Smoking Tobacco Use: Every Day    Smokeless Tobacco Use: Never    Passive Exposure: Not on file  Financial Resource Strain: Low Risk (08/31/2024)   Overall Financial Resource Strain (CARDIA)    Difficulty of Paying Living Expenses: Not very hard  Food Insecurity: Food Insecurity Present (08/31/2024)   Epic    Worried About Programme Researcher, Broadcasting/film/video in the Last Year: Never true    Ran Out of Food in the Last Year: Sometimes true  Transportation Needs: No Transportation Needs (08/31/2024)   Epic    Lack of Transportation (Medical): No    Lack of Transportation (Non-Medical): No  Physical Activity: Insufficiently Active (08/31/2024)   Exercise Vital Sign    Days of Exercise per Week: 7 days    Minutes of Exercise per Session: 20 min  Stress: No Stress Concern Present (08/31/2024)   [employer] of Occupational Health - Occupational Stress Questionnaire    Feeling of Stress: Not at all  Social Connections: Moderately Isolated (08/31/2024)   Social Connection and Isolation Panel    Frequency of Communication with Friends and Family: More than three times a week    Frequency of Social Gatherings with Friends and Family: Once a week    Attends Religious Services: 1 to 4 times per year    Active Member of [employer] or Organizations: No    Attends Engineer, Structural: Not on file    Marital Status: Never married  Intimate Partner Violence: Not At Risk (08/30/2023)   Humiliation, Afraid, Rape, and Kick questionnaire    Fear of Current or Ex-Partner: No    Emotionally Abused: No    Physically Abused: No    Sexually  Abused: No  Depression (PHQ2-9): Low Risk (09/14/2024)   Depression (PHQ2-9)    PHQ-2 Score: 0  Alcohol Screen: Low Risk (08/31/2024)   Alcohol Screen    Last Alcohol Screening Score (AUDIT): 3  Housing: Low Risk (08/31/2024)   Epic    Unable to Pay for Housing in the Last Year: No    Number of Times Moved in the Last Year: 0    Homeless in the Last Year: No  Utilities: Not At Risk (12/11/2023)   Received from [facility] Utilities    Threatened with loss of utilities: No  Health Literacy: Adequate Health Literacy (08/31/2024)   B1300 Health Literacy    Frequency of need for help with medical instructions: Never    Current Medications[1]  Allergies[2]  I personally reviewed active problem list, medication list, allergies, family history with the patient/caregiver today.   ROS Ten systems reviewed and is negative except as mentioned in HPI    Objective  Virtual encounter, vitals not obtained.  Physical Exam  Awake, alert and oriented   PHQ2/9:    09/14/2024    2:06 PM 08/31/2024    2:02 PM 10/04/2023    3:01 PM 08/30/2023    2:10 PM 07/02/2023    1:46 PM  Depression screen PHQ 2/9  Decreased Interest 0 0 0 0 0  Down, Depressed, Hopeless 0 0 0 0 0  PHQ - 2 Score 0 0 0 0 0  Altered sleeping   0    Tired, decreased energy   0    Change in appetite   0    Feeling bad or failure about yourself    0    Trouble concentrating   0    Moving slowly or fidgety/restless   0    Suicidal thoughts   0    PHQ-9 Score   0     Difficult doing work/chores   Not difficult at all       Data saved with a previous flowsheet row definition   PHQ-2/9 Result is negative.    Fall Risk:    09/14/2024    2:06 PM 08/31/2024    2:01 PM 01/06/2024    2:46 PM 10/04/2023    3:01 PM 07/02/2023  1:46 PM  Fall Risk   Falls in the past year? 0 0 0 0 0  Number falls in past yr: 0 0 0 0 0  Injury with Fall? 0 0 0  0  0   Risk for fall due to : No Fall Risks  No Fall Risks  No Fall Risks No Fall Risks  Follow up Falls evaluation completed Falls evaluation completed Falls evaluation completed Falls prevention discussed;Education provided;Falls evaluation completed Falls prevention discussed     Data saved with a previous flowsheet row definition    Assessment & Plan Essential hypertension Blood pressure stable with losartan  HCTZ. Chest discomfort likely non-cardiac. - Continue losartan  HCTZ 50/12.5 mg daily. - Monitor chest discomfort, report if persistent or worsening. - Consider Pepcid  for acid reflux if symptoms recur.  Prediabetes A1c improved to 5.9%, indicating better glycemic control. - Continue metformin  as prescribed. - Encouraged lifestyle modifications: avoid sweets, increase physical activity.  Vitamin D  deficiency Vitamin D  levels improved but remain suboptimal. - Start vitamin D  supplementation at 2000 IU daily.  Mild intermittent asthma - Continue montelukast  as needed. - Use Airsupra  inhaler as needed.  Allergic rhinitis due to pollen - Continue loratadine  and fluticasone  as needed.       I discussed the assessment and treatment plan with the patient. The patient was provided an opportunity to ask questions and all were answered. The patient agreed with the plan and demonstrated an understanding of the instructions.  The patient was advised to call back or seek an in-person evaluation if the symptoms worsen or if the condition fails to improve as anticipated.  I provided 25  minutes of non-face-to-face time during this encounter.    [1]  Current Outpatient Medications:    Albuterol -Budesonide (AIRSUPRA ) 90-80 MCG/ACT AERO, Inhale 2 puffs into the lungs 4 (four) times daily as needed., Disp: 107 g, Rfl: 0   aspirin  EC 81 MG tablet, Take 81 mg by mouth daily. Swallow whole., Disp: , Rfl:    Cholecalciferol (VITAMIN D3) 50 MCG (2000 UT) capsule, Take 1 capsule (2,000 Units total) by mouth daily., Disp: 100 capsule,  Rfl: 1   fluticasone  (FLONASE ) 50 MCG/ACT nasal spray, Place 2 sprays into both nostrils daily., Disp: 16 g, Rfl: 2   loratadine  (CLARITIN ) 10 MG tablet, Take 1 tablet (10 mg total) by mouth daily., Disp: 30 tablet, Rfl: 11   losartan -hydrochlorothiazide  (HYZAAR) 50-12.5 MG tablet, Take 1 tablet by mouth daily., Disp: 90 tablet, Rfl: 0   metFORMIN  (GLUCOPHAGE -XR) 750 MG 24 hr tablet, Take 1 tablet by mouth once daily with breakfast, Disp: 90 tablet, Rfl: 0   montelukast  (SINGULAIR ) 10 MG tablet, Take 1 tablet (10 mg total) by mouth at bedtime., Disp: 90 tablet, Rfl: 1 [2]  Allergies Allergen Reactions   Hepatitis B Virus Vaccines     Dysarthria    "

## 2025-01-18 ENCOUNTER — Ambulatory Visit (INDEPENDENT_AMBULATORY_CARE_PROVIDER_SITE_OTHER): Admitting: Vascular Surgery

## 2025-02-11 ENCOUNTER — Ambulatory Visit: Admitting: Oncology

## 2025-02-11 ENCOUNTER — Other Ambulatory Visit

## 2025-09-06 ENCOUNTER — Encounter: Admitting: Family Medicine
